# Patient Record
Sex: Female | Born: 1970 | Race: Black or African American | Hispanic: No | Smoking: Never smoker
Health system: Southern US, Community
[De-identification: ages and names within clinical notes are randomized; demographics above are authoritative.]

## PROBLEM LIST (undated history)

## (undated) DIAGNOSIS — J45909 Unspecified asthma, uncomplicated: Secondary | ICD-10-CM

## (undated) DIAGNOSIS — E559 Vitamin D deficiency, unspecified: Secondary | ICD-10-CM

## (undated) DIAGNOSIS — G43409 Hemiplegic migraine, not intractable, without status migrainosus: Secondary | ICD-10-CM

## (undated) DIAGNOSIS — R55 Syncope and collapse: Secondary | ICD-10-CM

## (undated) DIAGNOSIS — D649 Anemia, unspecified: Secondary | ICD-10-CM

## (undated) DIAGNOSIS — T7840XA Allergy, unspecified, initial encounter: Secondary | ICD-10-CM

## (undated) DIAGNOSIS — F909 Attention-deficit hyperactivity disorder, unspecified type: Secondary | ICD-10-CM

## (undated) DIAGNOSIS — K429 Umbilical hernia without obstruction or gangrene: Secondary | ICD-10-CM

## (undated) DIAGNOSIS — Z8709 Personal history of other diseases of the respiratory system: Secondary | ICD-10-CM

## (undated) DIAGNOSIS — Z8489 Family history of other specified conditions: Secondary | ICD-10-CM

## (undated) DIAGNOSIS — K219 Gastro-esophageal reflux disease without esophagitis: Secondary | ICD-10-CM

## (undated) DIAGNOSIS — F419 Anxiety disorder, unspecified: Secondary | ICD-10-CM

## (undated) DIAGNOSIS — E049 Nontoxic goiter, unspecified: Secondary | ICD-10-CM

## (undated) DIAGNOSIS — Z8659 Personal history of other mental and behavioral disorders: Secondary | ICD-10-CM

## (undated) DIAGNOSIS — I10 Essential (primary) hypertension: Secondary | ICD-10-CM

## (undated) DIAGNOSIS — R569 Unspecified convulsions: Secondary | ICD-10-CM

## (undated) DIAGNOSIS — F329 Major depressive disorder, single episode, unspecified: Secondary | ICD-10-CM

## (undated) HISTORY — DX: Allergy, unspecified, initial encounter: T78.40XA

## (undated) HISTORY — DX: Unspecified convulsions: R56.9

## (undated) HISTORY — DX: Vitamin D deficiency, unspecified: E55.9

## (undated) HISTORY — DX: Hemiplegic migraine, not intractable, without status migrainosus: G43.409

## (undated) HISTORY — DX: Anxiety disorder, unspecified: F41.9

## (undated) HISTORY — DX: Unspecified asthma, uncomplicated: J45.909

## (undated) HISTORY — DX: Umbilical hernia without obstruction or gangrene: K42.9

## (undated) HISTORY — DX: Major depressive disorder, single episode, unspecified: F32.9

## (undated) HISTORY — PX: WISDOM TOOTH EXTRACTION: SHX21

## (undated) HISTORY — DX: Syncope and collapse: R55

## (undated) HISTORY — DX: Anemia, unspecified: D64.9

## (undated) HISTORY — DX: Gastro-esophageal reflux disease without esophagitis: K21.9

## (undated) SURGERY — Surgical Case
Anesthesia: *Unknown

---
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
## 1998-09-14 ENCOUNTER — Other Ambulatory Visit: Admission: RE | Admit: 1998-09-14 | Discharge: 1998-09-14 | Payer: Self-pay | Admitting: Family Medicine

## 1998-12-20 DIAGNOSIS — E049 Nontoxic goiter, unspecified: Secondary | ICD-10-CM | POA: Insufficient documentation

## 2000-01-18 ENCOUNTER — Emergency Department (HOSPITAL_COMMUNITY): Admission: EM | Admit: 2000-01-18 | Discharge: 2000-01-18 | Payer: Self-pay | Admitting: *Deleted

## 2000-06-06 ENCOUNTER — Encounter: Admission: RE | Admit: 2000-06-06 | Discharge: 2000-06-06 | Payer: Self-pay | Admitting: Family Medicine

## 2000-06-06 ENCOUNTER — Encounter: Payer: Self-pay | Admitting: Family Medicine

## 2000-12-29 ENCOUNTER — Encounter: Admission: RE | Admit: 2000-12-29 | Discharge: 2000-12-29 | Payer: Self-pay | Admitting: Family Medicine

## 2000-12-29 ENCOUNTER — Encounter: Payer: Self-pay | Admitting: Family Medicine

## 2001-01-11 ENCOUNTER — Encounter: Admission: RE | Admit: 2001-01-11 | Discharge: 2001-04-11 | Payer: Self-pay | Admitting: Family Medicine

## 2001-12-23 ENCOUNTER — Emergency Department (HOSPITAL_COMMUNITY): Admission: EM | Admit: 2001-12-23 | Discharge: 2001-12-23 | Payer: Self-pay | Admitting: Emergency Medicine

## 2003-11-21 ENCOUNTER — Emergency Department (HOSPITAL_COMMUNITY): Admission: EM | Admit: 2003-11-21 | Discharge: 2003-11-21 | Payer: Self-pay | Admitting: Emergency Medicine

## 2005-01-11 ENCOUNTER — Other Ambulatory Visit: Admission: RE | Admit: 2005-01-11 | Discharge: 2005-01-11 | Payer: Self-pay | Admitting: Family Medicine

## 2005-01-20 ENCOUNTER — Encounter: Admission: RE | Admit: 2005-01-20 | Discharge: 2005-01-20 | Payer: Self-pay | Admitting: Family Medicine

## 2006-07-28 ENCOUNTER — Encounter: Admission: RE | Admit: 2006-07-28 | Discharge: 2006-07-28 | Payer: Self-pay | Admitting: Family Medicine

## 2008-05-06 ENCOUNTER — Encounter: Admission: RE | Admit: 2008-05-06 | Discharge: 2008-05-06 | Payer: Self-pay | Admitting: Family Medicine

## 2008-06-04 ENCOUNTER — Emergency Department (HOSPITAL_COMMUNITY): Admission: EM | Admit: 2008-06-04 | Discharge: 2008-06-04 | Payer: Self-pay | Admitting: Emergency Medicine

## 2008-12-05 ENCOUNTER — Emergency Department (HOSPITAL_COMMUNITY): Admission: EM | Admit: 2008-12-05 | Discharge: 2008-12-05 | Payer: Self-pay | Admitting: Emergency Medicine

## 2011-01-09 ENCOUNTER — Encounter: Payer: Self-pay | Admitting: Family Medicine

## 2013-10-17 LAB — LAB REPORT - SCANNED
EGFR (Non-African Amer.): 75.88
Free T4: 0.93 ng/dL
TSH: 0.47 (ref 0.41–5.90)

## 2014-05-14 ENCOUNTER — Emergency Department (HOSPITAL_COMMUNITY)
Admission: EM | Admit: 2014-05-14 | Discharge: 2014-05-14 | Discharge status: Against Medical Advice | Payer: Self-pay | Attending: Emergency Medicine | Admitting: Emergency Medicine

## 2014-05-14 DIAGNOSIS — R0602 Shortness of breath: Secondary | ICD-10-CM | POA: Insufficient documentation

## 2014-05-14 NOTE — ED Notes (Signed)
Pt states that she feels better and would like to go home. Explained to pt the risk and her leaving. Pt remains wait to leave. Encouraged pt to follow up with PCP and that if symptoms returned or got worst to return to ED for evaluation. Pt verbalized understanding of directions. Pt VS stable. Pt alert and ambulatory.

## 2016-11-27 ENCOUNTER — Ambulatory Visit (HOSPITAL_COMMUNITY)
Admission: EM | Admit: 2016-11-27 | Discharge: 2016-11-27 | Disposition: A | Discharge status: Home / Self Care | Payer: Self-pay | Attending: Emergency Medicine | Admitting: Emergency Medicine

## 2016-11-27 ENCOUNTER — Encounter (HOSPITAL_COMMUNITY): Payer: Self-pay | Admitting: Emergency Medicine

## 2016-11-27 DIAGNOSIS — B349 Viral infection, unspecified: Secondary | ICD-10-CM

## 2016-11-27 HISTORY — DX: Nontoxic goiter, unspecified: E04.9

## 2016-11-27 NOTE — ED Provider Notes (Signed)
[facility]    CSN: [id number] Arrival date & time: 11/27/16  1248     History   Chief Complaint Chief Complaint  Patient presents with  . Headache  . Torticollis    HPI Whitney Kent is a 45 y.o. female.   HPI She is a 45 year old woman here for evaluation of headache and neck pain. Her symptoms started Friday night with just not feeling very well. She describes a generalized headache, feeling like her neck is stiff and sore, and redness of the eyes. She also reports some drainage from the eyes, worse on the right. She's had a little bit of nasal congestion. No sore throat. No cough or shortness of breath. She reports a possible fever Friday night, but none recently. She'll have diarrhea, but no nausea or vomiting. Denies body aches. Does report some fatigue.  Past Medical History:  Diagnosis Date  . Goiter     There are no active problems to display for this patient.   History reviewed. No pertinent surgical history.  OB History    No data available       Home Medications    Prior to Admission medications   Not on File    Family History No family history on file.  Social History Social History  Substance Use Topics  . Smoking status: Not on file  . Smokeless tobacco: Not on file  . Alcohol use Not on file     Allergies   Patient has no known allergies.   Review of Systems Review of Systems As in history of present illness  Physical Exam Triage Vital Signs ED Triage Vitals [11/27/16 1302]  Enc Vitals Group     BP 113/73     Pulse Rate 72     Resp 18     Temp 98.7 F (37.1 C)     Temp Source Oral     SpO2 100 %     Weight      Height      Head Circumference      Peak Flow      Pain Score      Pain Loc      Pain Edu?      Excl. in [place]?    No data found.   Updated Vital Signs BP 113/73 (BP Location: Left Arm)   Pulse 72   Temp 98.7 F (37.1 C) (Oral)   Resp 18   Ht 5\' 2"  (1.575 m)   Wt 122 lb (55.3 kg)   SpO2  100%   BMI 22.31 kg/m   Visual Acuity Right Eye Distance:   Left Eye Distance:   Bilateral Distance:    Right Eye Near:   Left Eye Near:    Bilateral Near:     Physical Exam  Constitutional: She is oriented to person, place, and time. She appears well-developed and well-nourished. No distress.  Appears fatigued  HENT:  Nose: Nose normal.  Mouth/Throat: Oropharynx is clear and moist. No oropharyngeal exudate.  TMs normal bilaterally  Neck: Normal range of motion. Neck supple.  Cardiovascular: Normal rate, regular rhythm and normal heart sounds.   No murmur heard. Pulmonary/Chest: Effort normal and breath sounds normal. No respiratory distress. She has no wheezes. She has no rales.  Lymphadenopathy:    She has no cervical adenopathy.  Neurological: She is alert and oriented to person, place, and time.     UC Treatments / Results  Labs (all labs ordered are listed, but only  abnormal results are displayed) Labs Reviewed - No data to display  EKG  EKG Interpretation None       Radiology No results found.  Procedures Procedures (including critical care time)  Medications Ordered in UC Medications - No data to display   Initial Impression / Assessment and Plan / UC Course  I have reviewed the triage vital signs and the nursing notes.  Pertinent labs & imaging results that were available during my care of the patient were reviewed by me and considered in my medical decision making (see chart for details).  Clinical Course     Exam unremarkable. No meningeal signs. Symptomatic treatment with Tylenol and ibuprofen. Return precautions reviewed.  Final Clinical Impressions(s) / UC Diagnoses   Final diagnoses:  Viral syndrome    New Prescriptions There are no discharge medications for this patient.    Melony Overly, MD 11/27/16 1350

## 2016-11-27 NOTE — Discharge Instructions (Signed)
Your symptoms are coming from a virus. This is not the flu. Get plenty of rest and drink plenty of fluids. Use ibuprofen or Tylenol as needed for headache and neck pain. Get some saline drops to help with your eyes. Things should start improving in the next 2-3 days. If you develop fevers, trouble moving your neck, or are getting worse, please go to the emergency room.

## 2016-11-27 NOTE — ED Triage Notes (Signed)
PT reports stiff neck, headache, and chills. PT reports eye drainage as well.

## 2016-12-19 HISTORY — PX: COLONOSCOPY: SHX174

## 2017-03-08 ENCOUNTER — Ambulatory Visit (HOSPITAL_COMMUNITY)
Admission: EM | Admit: 2017-03-08 | Discharge: 2017-03-08 | Disposition: A | Discharge status: Home / Self Care | Payer: Self-pay | Attending: Emergency Medicine | Admitting: Emergency Medicine

## 2017-03-08 ENCOUNTER — Encounter (HOSPITAL_COMMUNITY): Payer: Self-pay | Admitting: Emergency Medicine

## 2017-03-08 DIAGNOSIS — R197 Diarrhea, unspecified: Secondary | ICD-10-CM

## 2017-03-08 DIAGNOSIS — M5442 Lumbago with sciatica, left side: Secondary | ICD-10-CM

## 2017-03-08 DIAGNOSIS — M791 Myalgia: Secondary | ICD-10-CM

## 2017-03-08 MED ORDER — KETOROLAC TROMETHAMINE 30 MG/ML IJ SOLN
INTRAMUSCULAR | Status: AC
  Filled 2017-03-08: qty 1

## 2017-03-08 MED ORDER — KETOROLAC TROMETHAMINE 60 MG/2ML IM SOLN
30.0000 mg | Freq: Once | INTRAMUSCULAR | Status: AC
  Administered 2017-03-08: 30 mg via INTRAMUSCULAR

## 2017-03-08 MED ORDER — TRAMADOL HCL 50 MG PO TABS: 50.0000 mg | ORAL_TABLET | Freq: Four times a day (QID) | ORAL | 0 refills | Status: DC | PRN

## 2017-03-08 MED ORDER — METHYLPREDNISOLONE 4 MG PO TBPK: ORAL_TABLET | ORAL | 0 refills | Status: DC

## 2017-03-08 MED ORDER — METAXALONE 800 MG PO TABS: 800.0000 mg | ORAL_TABLET | Freq: Three times a day (TID) | ORAL | 0 refills | Status: DC

## 2017-03-08 MED ORDER — IBUPROFEN 600 MG PO TABS: 600.0000 mg | ORAL_TABLET | Freq: Four times a day (QID) | ORAL | 0 refills | Status: DC | PRN

## 2017-03-08 NOTE — ED Triage Notes (Signed)
Onset 3/14.  The morning of 3/14 and diarrhea, pain in back started at the same time.  Pain in bilateral buttocks.

## 2017-03-08 NOTE — Discharge Instructions (Signed)
Take the NSAID on a regular basis for the next 10 days. tramadol for severe pain only. many people find gentle stretching and deep tissue massage helpful. Try [facility] on [address]. They have very reasonable prices and take walk ins. Or you can go to  [facility]. Follow-up with your primary care physician in several days, go to the ER for the signs and symptoms we discussed.

## 2017-03-08 NOTE — ED Provider Notes (Signed)
HPI  SUBJECTIVE:  Whitney Kent is a 46 y.o. female who presents with 2 weeks of bilateral low back pain that she describes as daily, stabbing, tight muscles. States it is waxing and waning. She states that it radiates down both buttocks, but is worse on the left side. The symptoms are worse with sitting for prolonged periods of time, torso rotation to the left, and when she is "feeling gassy. Symptoms are better with standing up, having bowel movements. She has tried heat, ice, sitting in a whirlpool, topical lidocaine, ibuprofen 400 mg every 6 8 hours and stretching. Last dose of ibuprofen was within 6-8 hours of evaluation. She denies recent heavy lifting, trauma to her back, N/V, fevers, flank pain, abdominal pain, urinary urgency, frequency, dysuria, cloudy or odorous urine, hematuria.  No syncope. No saddle anesthesia, distal weakness/numbness, bilateral radicular leg pain/weakness, fevers/night sweats, neurological deficits,  bladder/ bowel incontinence, urinary retention, h/o CA / multiple myleoma, unexplained weight loss, pain worse at night,  h/o prolonged steroid use, h/o osteopenia, h/o IVDU, h/o HIV, recent bacteremia, known AAA.  States feels similar to previous episodes of sciatica. She has a past medical history of pyelonephritis, sciatica, IBS, goiter. States does not feel similar to pyelonephritis. LMP: Today. She denies possibility of being pregnant. PMD: Dr. Julian Hy.    Past Medical History:  Diagnosis Date  . Goiter     No past surgical history on file.  No family history on file.  Social History  Substance Use Topics  . Smoking status: Never Smoker  . Smokeless tobacco: Not on file  . Alcohol use Yes     Current Facility-Administered Medications:  .  ketorolac (TORADOL) injection 30 mg, 30 mg, Intramuscular, Once, Melynda Ripple, MD  Current Outpatient Prescriptions:  .  ASPERCREME W/LIDOCAINE EX, Apply topically., Disp: , Rfl:  .  ibuprofen  (ADVIL,MOTRIN) 600 MG tablet, Take 1 tablet (600 mg total) by mouth every 6 (six) hours as needed., Disp: 30 tablet, Rfl: 0 .  metaxalone (SKELAXIN) 800 MG tablet, Take 1 tablet (800 mg total) by mouth 3 (three) times daily., Disp: 21 tablet, Rfl: 0 .  methylPREDNISolone (MEDROL DOSEPAK) 4 MG TBPK tablet, follow package directions, Disp: 21 tablet, Rfl: 0 .  traMADol (ULTRAM) 50 MG tablet, Take 1 tablet (50 mg total) by mouth every 6 (six) hours as needed., Disp: 20 tablet, Rfl: 0  No Known Allergies   ROS  As noted in HPI.   Physical Exam  BP 98/63 (BP Location: Right Arm)   Pulse 83   Temp 98.3 F (36.8 C) (Oral)   LMP 03/08/2017   SpO2 97%   Constitutional: Well developed, well nourished, no acute distress Eyes:  EOMI, conjunctiva normal bilaterally HENT: Normocephalic, atraumatic,mucus membranes moist Respiratory: Normal inspiratory effort Cardiovascular: Normal rate GI: nondistended. No suprapubic, flank tenderness. Normal bowel sounds skin: No rash, skin intact Musculoskeletal: no CVAT. + L paralumbar tenderness, + mild left-sided muscle spasm. Mild tenderness along the L SI joint. No other bony tenderness. Bilateral lower extremities nontender, baseline ROM with intact  PT pulses. No pain with int/ext rotation flex/extension hips bilaterally. SLR positive left side. Sensation baseline light touch bilaterally for Pt, DTR's symmetric and intact bilaterally KJ , Motor symmetric bilateral 5/5 hip flexion, quadriceps, hamstrings, EHL, foot dorsiflexion, foot plantarflexion, gait normal  Neurologic: Alert & oriented x 3, no focal neuro deficits Psychiatric: Speech and behavior appropriate   ED Course   Medications  ketorolac (TORADOL) injection 30 mg (not administered)  No orders of the defined types were placed in this encounter.   No results found for this or any previous visit (from the past 24 hour(s)). No results found.  ED Clinical Impression  Acute bilateral  low back pain with left-sided sciatica   ED Assessment/Plan  [place] narcotic database reviewed. Pt with no narcotic rx in the past 6 months.   No evidence of uti, nephrolithiasis.  No evidence of spinal cord involvement based on H&P. Pt describing typical back pain, has been < 6 week duration. No historical red flags as noted in HPI. No physical red flags such as fever, midline bony tenderness, lower extremity weakness, saddle anesthesia. Imaging not indicated at this time.   Giving Toradol 30 mg IM 1. Pt ambulatory in the UC. Home with NSAID, tramadol,  muscle relaxants and Medrol Dosepak. Pt to f/u with  PMD.  Discussed medical decision-making, and plan for follow-up with the patient.  Discussed signs and symptoms that should prompt return to the emergency department.  Patient agrees with plan.  Meds ordered this encounter  Medications  . DISCONTD: ibuprofen (ADVIL,MOTRIN) 200 MG tablet    Sig: Take 200 mg by mouth every 6 (six) hours as needed.  . ASPERCREME W/LIDOCAINE EX    Sig: Apply topically.  Marland Kitchen ketorolac (TORADOL) injection 30 mg  . traMADol (ULTRAM) 50 MG tablet    Sig: Take 1 tablet (50 mg total) by mouth every 6 (six) hours as needed.    Dispense:  20 tablet    Refill:  0  . ibuprofen (ADVIL,MOTRIN) 600 MG tablet    Sig: Take 1 tablet (600 mg total) by mouth every 6 (six) hours as needed.    Dispense:  30 tablet    Refill:  0  . metaxalone (SKELAXIN) 800 MG tablet    Sig: Take 1 tablet (800 mg total) by mouth 3 (three) times daily.    Dispense:  21 tablet    Refill:  0  . methylPREDNISolone (MEDROL DOSEPAK) 4 MG TBPK tablet    Sig: follow package directions    Dispense:  21 tablet    Refill:  0   *This clinic note was created using [employer]. Therefore, there may be occasional mistakes despite careful proofreading.  ?    Melynda Ripple, MD 03/08/17 [phone]

## 2017-04-06 ENCOUNTER — Encounter (HOSPITAL_COMMUNITY): Payer: Self-pay

## 2017-04-06 ENCOUNTER — Emergency Department (HOSPITAL_COMMUNITY)
Admission: EM | Admit: 2017-04-06 | Discharge: 2017-04-06 | Disposition: A | Discharge status: Home / Self Care | Payer: Self-pay | Attending: Emergency Medicine | Admitting: Emergency Medicine

## 2017-04-06 DIAGNOSIS — M62830 Muscle spasm of back: Secondary | ICD-10-CM | POA: Insufficient documentation

## 2017-04-06 DIAGNOSIS — Z79899 Other long term (current) drug therapy: Secondary | ICD-10-CM | POA: Insufficient documentation

## 2017-04-06 DIAGNOSIS — M5441 Lumbago with sciatica, right side: Secondary | ICD-10-CM | POA: Insufficient documentation

## 2017-04-06 MED ORDER — LIDOCAINE 5 % EX PTCH: 1.0000 | MEDICATED_PATCH | CUTANEOUS | 0 refills | Status: DC

## 2017-04-06 MED ORDER — METHOCARBAMOL 500 MG PO TABS: 500.0000 mg | ORAL_TABLET | Freq: Three times a day (TID) | ORAL | 0 refills | Status: DC | PRN

## 2017-04-06 MED ORDER — DOCUSATE SODIUM 100 MG PO CAPS: 100.0000 mg | ORAL_CAPSULE | Freq: Two times a day (BID) | ORAL | 0 refills | Status: DC

## 2017-04-06 MED ORDER — HYDROCODONE-ACETAMINOPHEN 5-325 MG PO TABS: 2.0000 | ORAL_TABLET | ORAL | 0 refills | Status: DC | PRN

## 2017-04-06 MED ORDER — METHOCARBAMOL 500 MG PO TABS
1000.0000 mg | ORAL_TABLET | Freq: Once | ORAL | Status: AC
  Administered 2017-04-06: 1000 mg via ORAL
  Filled 2017-04-06: qty 2

## 2017-04-06 NOTE — ED Provider Notes (Signed)
[facility] DEPT Provider Note   CSN: [id number] Arrival date & time: 04/06/17  4098     History   Chief Complaint Chief Complaint  Patient presents with  . Back Pain    HPI Whitney Kent is a 46 y.o. female. Chief complaint is right low back pain  HPI: 46 year old female with no prior history of back issues, acute or chronic. On March 7 she states that she had diarrhea. She states by the next day she was having back pain. Her back pain has waxed and waned since then. It has now been present for 5 weeks. She is taking a muscle relaxant, ibuprofen, tramadol and she seemed to get constipated on tramadol. She felt like when she had to bear down her pain became worse. It is right-sided. Gluteal. Sometimes radiating to the leg. She was treated with prednisone for possible sciatica  She's not had bowel or bladder incontinence. She has not had frank retention. After she stop the tramadol., Laxative and has had normal bowel movements. She has normal perineal sensation. She does not have limitation of strength in her legs  She lives in [place]. She works in [place] and to the [place] as well as driving a lot with her job. Getting in and out of her car causing her discomfort. Described as spasmodic, right low back.  She describes urinating less over the last few days. Should she reduce this to drinking this. She denies any difficulty emptying her bladder. She has had no incontinence. No dysuria  Should cancer. No fevers. Past Medical History:  Diagnosis Date  . Goiter     There are no active problems to display for this patient.   History reviewed. No pertinent surgical history.  OB History    No data available       Home Medications    Prior to Admission medications   Medication Sig Start Date End Date Taking? Authorizing Provider  docusate sodium (COLACE) 100 MG capsule Take 1 capsule (100 mg total) by mouth every 12 (twelve) hours. 04/06/17   Tanna Furry, MD    HYDROcodone-acetaminophen (NORCO/VICODIN) 5-325 MG tablet Take 2 tablets by mouth every 4 (four) hours as needed. 04/06/17   Tanna Furry, MD  ibuprofen (ADVIL,MOTRIN) 600 MG tablet Take 1 tablet (600 mg total) by mouth every 6 (six) hours as needed. 03/08/17   Melynda Ripple, MD  lidocaine (LIDODERM) 5 % Place 1 patch onto the skin daily. Remove & Discard patch within 12 hours or as directed by MD 04/06/17   Tanna Furry, MD  metaxalone (SKELAXIN) 800 MG tablet Take 1 tablet (800 mg total) by mouth 3 (three) times daily. 03/08/17   Melynda Ripple, MD  methocarbamol (ROBAXIN) 500 MG tablet Take 1 tablet (500 mg total) by mouth 3 (three) times daily between meals as needed. 04/06/17   Tanna Furry, MD  methylPREDNISolone (MEDROL DOSEPAK) 4 MG TBPK tablet follow package directions 03/08/17   Melynda Ripple, MD  traMADol (ULTRAM) 50 MG tablet Take 1 tablet (50 mg total) by mouth every 6 (six) hours as needed. 03/08/17   Melynda Ripple, MD    Family History History reviewed. No pertinent family history.  Social History Social History  Substance Use Topics  . Smoking status: Never Smoker  . Smokeless tobacco: Never Used  . Alcohol use Yes     Allergies   Patient has no known allergies.   Review of Systems Review of Systems  Constitutional: Negative for appetite change, chills, diaphoresis, fatigue and  fever.  HENT: Negative for mouth sores, sore throat and trouble swallowing.   Eyes: Negative for visual disturbance.  Respiratory: Negative for cough, chest tightness, shortness of breath and wheezing.   Cardiovascular: Negative for chest pain.  Gastrointestinal: Negative for abdominal distention, abdominal pain, diarrhea, nausea and vomiting.  Endocrine: Negative for polydipsia, polyphagia and polyuria.  Genitourinary: Positive for decreased urine volume. Negative for dysuria, frequency and hematuria.  Musculoskeletal: Positive for back pain. Negative for gait problem.  Skin: Negative for  color change, pallor and rash.  Neurological: Negative for dizziness, syncope, light-headedness and headaches.  Hematological: Does not bruise/bleed easily.  Psychiatric/Behavioral: Negative for behavioral problems and confusion.     Physical Exam Updated Vital Signs BP 119/90 (BP Location: Left Arm)   Pulse (!) 102   Temp 97.7 F (36.5 C) (Oral)   Resp 18   Ht 5\' 2"  (1.575 m)   Wt 117 lb (53.1 kg)   LMP 03/08/2017   SpO2 100%   BMI 21.40 kg/m   Physical Exam  Constitutional: She is oriented to person, place, and time. She appears well-developed and well-nourished. No distress.  HENT:  Head: Normocephalic.  Eyes: Conjunctivae are normal. Pupils are equal, round, and reactive to light. No scleral icterus.  Neck: Normal range of motion. Neck supple. No thyromegaly present.  Cardiovascular: Normal rate and regular rhythm.  Exam reveals no gallop and no friction rub.   No murmur heard. Pulmonary/Chest: Effort normal and breath sounds normal. No respiratory distress. She has no wheezes. She has no rales.  Abdominal: Soft. Bowel sounds are normal. She exhibits no distension. There is no tenderness. There is no rebound.  Musculoskeletal: Normal range of motion.       Back:  Neurological: She is alert and oriented to person, place, and time.  Norma symmetric strength to flex/.extend hip and knees, dorsi/plantar flex ankles. Normal symmetric sensation to all distributions to LEs Patellar and achilles reflexes 1-2+. Downgoing Babinski   Skin: Skin is warm and dry. No rash noted.  Psychiatric: She has a normal mood and affect. Her behavior is normal.     ED Treatments / Results  Labs (all labs ordered are listed, but only abnormal results are displayed) Labs Reviewed - No data to display  EKG  EKG Interpretation None       Radiology No results found.  Procedures Procedures (including critical care time)  Medications Ordered in ED Medications  methocarbamol  (ROBAXIN) tablet 1,000 mg (not administered)     Initial Impression / Assessment and Plan / ED Course  I have reviewed the triage vital signs and the nursing notes.  Pertinent labs & imaging results that were available during my care of the patient were reviewed by me and considered in my medical decision making (see chart for details).   no indicators for imaging. No indicators for acute neurological compromise. Pain is reproducible to palpation in the gluteal musculature. Does not currently have sciatic radiation. Does not have radicular symptoms or findings. Neurologically intact with normal strength, range, and reflexes. Limited bedside ultrasound shows an empty bladder. She has normal perineal sensation. Rectal exam not performed.  I discussed using pain medicine on a limited basis for today and tomorrow with Colace to avoid constipation. Robaxin. I recommended physical therapy. However patient isn't insured patient she will discuss this with her primary care. I have prescribed lidocaine patches to use locally and topically. I discussed symptoms that would indicate return for evaluation possible imaging including radicular pattern,  incontinence, retention, weakness. Patient question understanding of the above instructions.    Final Clinical Impressions(s) / ED Diagnoses   Final diagnoses:  Right-sided low back pain with right-sided sciatica, unspecified chronicity  Muscle spasm of back    New Prescriptions New Prescriptions   DOCUSATE SODIUM (COLACE) 100 MG CAPSULE    Take 1 capsule (100 mg total) by mouth every 12 (twelve) hours.   HYDROCODONE-ACETAMINOPHEN (NORCO/VICODIN) 5-325 MG TABLET    Take 2 tablets by mouth every 4 (four) hours as needed.   LIDOCAINE (LIDODERM) 5 %    Place 1 patch onto the skin daily. Remove & Discard patch within 12 hours or as directed by MD   METHOCARBAMOL (ROBAXIN) 500 MG TABLET    Take 1 tablet (500 mg total) by mouth 3 (three) times daily between meals  as needed.     Tanna Furry, MD 04/06/17 [phone]

## 2017-04-06 NOTE — ED Notes (Signed)
ED Provider at bedside. 

## 2017-04-06 NOTE — ED Triage Notes (Signed)
States back pain since March of this year has seen multiple doctors for this pain and given medication and since last pm unable to get pain to go away and states states now she has urinary difficulty unable to urinate. States most of pain on right side.

## 2017-04-06 NOTE — Discharge Instructions (Signed)
Robaxin Rx (Muscle relaxant). Vicoden Rx (pain medication) . Can cause constipation. Take colace am and pm while taking vicoden. Check with your primary care physician Monday if not improving.

## 2017-04-20 ENCOUNTER — Other Ambulatory Visit: Payer: Self-pay | Admitting: Nurse Practitioner

## 2017-04-20 DIAGNOSIS — M533 Sacrococcygeal disorders, not elsewhere classified: Secondary | ICD-10-CM

## 2017-04-20 DIAGNOSIS — R1030 Lower abdominal pain, unspecified: Secondary | ICD-10-CM

## 2017-04-20 DIAGNOSIS — R198 Other specified symptoms and signs involving the digestive system and abdomen: Secondary | ICD-10-CM

## 2017-04-25 ENCOUNTER — Ambulatory Visit
Admission: RE | Admit: 2017-04-25 | Discharge: 2017-04-25 | Disposition: A | Discharge status: Home / Self Care | Payer: Self-pay | Source: Ambulatory Visit | Attending: Nurse Practitioner | Admitting: Nurse Practitioner

## 2017-04-25 DIAGNOSIS — M533 Sacrococcygeal disorders, not elsewhere classified: Secondary | ICD-10-CM

## 2017-04-25 DIAGNOSIS — R1030 Lower abdominal pain, unspecified: Secondary | ICD-10-CM

## 2017-04-25 DIAGNOSIS — R198 Other specified symptoms and signs involving the digestive system and abdomen: Secondary | ICD-10-CM

## 2017-04-25 MED ORDER — IOPAMIDOL (ISOVUE-300) INJECTION 61%
100.0000 mL | Freq: Once | INTRAVENOUS | Status: AC | PRN
  Administered 2017-04-25: 100 mL via INTRAVENOUS

## 2017-09-18 ENCOUNTER — Telehealth: Payer: Self-pay | Admitting: Endocrinology

## 2017-09-18 ENCOUNTER — Encounter: Payer: Self-pay | Admitting: Gastroenterology

## 2017-09-18 NOTE — Telephone Encounter (Signed)
Patient calling to set up a new patient appointment

## 2017-09-25 ENCOUNTER — Encounter: Payer: Self-pay | Admitting: Family Medicine

## 2017-09-25 ENCOUNTER — Ambulatory Visit (INDEPENDENT_AMBULATORY_CARE_PROVIDER_SITE_OTHER): Payer: PRIVATE HEALTH INSURANCE | Admitting: Family Medicine

## 2017-09-25 VITALS — BP 100/76 | HR 71 | Temp 98.0°F | Ht 62.5 in | Wt 128.1 lb

## 2017-09-25 DIAGNOSIS — Z23 Encounter for immunization: Secondary | ICD-10-CM

## 2017-09-25 DIAGNOSIS — Z7689 Persons encountering health services in other specified circumstances: Secondary | ICD-10-CM

## 2017-09-25 DIAGNOSIS — E049 Nontoxic goiter, unspecified: Secondary | ICD-10-CM | POA: Diagnosis not present

## 2017-09-25 DIAGNOSIS — R5383 Other fatigue: Secondary | ICD-10-CM

## 2017-09-25 DIAGNOSIS — Z13220 Encounter for screening for lipoid disorders: Secondary | ICD-10-CM | POA: Diagnosis not present

## 2017-09-25 DIAGNOSIS — R41840 Attention and concentration deficit: Secondary | ICD-10-CM | POA: Diagnosis not present

## 2017-09-25 LAB — CBC WITH DIFFERENTIAL/PLATELET
BASOS ABS: 0 10*3/uL (ref 0.0–0.1)
Basophils Relative: 0.1 % (ref 0.0–3.0)
EOS ABS: 0.3 10*3/uL (ref 0.0–0.7)
Eosinophils Relative: 5 % (ref 0.0–5.0)
HEMATOCRIT: 40.6 % (ref 36.0–46.0)
Hemoglobin: 13.4 g/dL (ref 12.0–15.0)
LYMPHS PCT: 28.3 % (ref 12.0–46.0)
Lymphs Abs: 2 10*3/uL (ref 0.7–4.0)
MCHC: 33 g/dL (ref 30.0–36.0)
MCV: 82.8 fl (ref 78.0–100.0)
MONOS PCT: 6.9 % (ref 3.0–12.0)
Monocytes Absolute: 0.5 10*3/uL (ref 0.1–1.0)
Neutro Abs: 4.2 10*3/uL (ref 1.4–7.7)
Neutrophils Relative %: 59.7 % (ref 43.0–77.0)
Platelets: 236 10*3/uL (ref 150.0–400.0)
RBC: 4.91 Mil/uL (ref 3.87–5.11)
RDW: 13.1 % (ref 11.5–15.5)
WBC: 7 10*3/uL (ref 4.0–10.5)

## 2017-09-25 LAB — LIPID PANEL
CHOL/HDL RATIO: 2
Cholesterol: 160 mg/dL (ref 0–200)
HDL: 86.6 mg/dL (ref >39.00)
LDL Cholesterol: 55 mg/dL (ref 0–99)
NONHDL: 73.2
Triglycerides: 89 mg/dL (ref 0.0–149.0)
VLDL: 17.8 mg/dL (ref 0.0–40.0)

## 2017-09-25 LAB — COMPREHENSIVE METABOLIC PANEL
ALBUMIN: 4.2 g/dL (ref 3.5–5.2)
ALK PHOS: 43 U/L (ref 39–117)
ALT: 6 U/L (ref 0–35)
AST: 12 U/L (ref 0–37)
BILIRUBIN TOTAL: 0.3 mg/dL (ref 0.2–1.2)
BUN: 9 mg/dL (ref 6–23)
CO2: 28 mEq/L (ref 19–32)
CREATININE: 0.67 mg/dL (ref 0.40–1.20)
Calcium: 9.2 mg/dL (ref 8.4–10.5)
Chloride: 103 mEq/L (ref 96–112)
GFR: 122.65 mL/min (ref >60.00)
Glucose, Bld: 97 mg/dL (ref 70–99)
POTASSIUM: 4.2 meq/L (ref 3.5–5.1)
SODIUM: 137 meq/L (ref 135–145)
TOTAL PROTEIN: 6.8 g/dL (ref 6.0–8.3)

## 2017-09-25 LAB — T4, FREE: Free T4: 0.89 ng/dL (ref 0.60–1.60)

## 2017-09-25 LAB — VITAMIN D 25 HYDROXY (VIT D DEFICIENCY, FRACTURES): VITD: 29.16 ng/mL — ABNORMAL LOW (ref 30.00–100.00)

## 2017-09-25 LAB — TSH: TSH: 0.74 u[IU]/mL (ref 0.35–4.50)

## 2017-09-25 NOTE — Patient Instructions (Addendum)
We have ordered labs at this visit. It can take up to 1-2 weeks for results and processing. If results require follow up or explanation, we will call you with instructions. Clinically stable results will be released to your [facility] or sent to you via mail. If you have not heard from [place] or cannot find your results in [facility] in 2 weeks please contact our office at [phone].    Fatigue Fatigue is feeling tired all of the time, a lack of energy, or a lack of motivation. Occasional or mild fatigue is often a normal response to activity or life in general. However, long-lasting (chronic) or extreme fatigue may indicate an underlying medical condition. Follow these instructions at home: Watch your fatigue for any changes. The following actions may help to lessen any discomfort you are feeling:  Talk to your health care provider about how much sleep you need each night. Try to get the required amount every night.  Take medicines only as directed by your health care provider.  Eat a healthy and nutritious diet. Ask your health care provider if you need help changing your diet.  Drink enough fluid to keep your urine clear or pale yellow.  Practice ways of relaxing, such as yoga, meditation, massage therapy, or acupuncture.  Exercise regularly.  Change situations that cause you stress. Try to keep your work and personal routine reasonable.  Do not abuse illegal drugs.  Limit alcohol intake to no more than 1 drink per day for nonpregnant women and 2 drinks per day for men. One drink equals 12 ounces of beer, 5 ounces of wine, or 1 ounces of hard liquor.  Take a multivitamin, if directed by your health care provider.  Contact a health care provider if:  Your fatigue does not get better.  You have a fever.  You have unintentional weight loss or gain.  You have headaches.  You have difficulty: ? Falling asleep. ? Sleeping throughout the night.  You feel angry, guilty, anxious, or  sad.  You are unable to have a bowel movement (constipation).  You skin is dry.  Your legs or another part of your body is swollen. Get help right away if:  You feel confused.  Your vision is blurry.  You feel faint or pass out.  You have a severe headache.  You have severe abdominal, pelvic, or back pain.  You have chest pain, shortness of breath, or an irregular or fast heartbeat.  You are unable to urinate or you urinate less than normal.  You develop abnormal bleeding, such as bleeding from the rectum, vagina, nose, lungs, or nipples.  You vomit blood.  You have thoughts about harming yourself or committing suicide.  You are worried that you might harm someone else. This information is not intended to replace advice given to you by your health care provider. Make sure you discuss any questions you have with your health care provider. Document Released: 10/02/2007 Document Revised: 05/12/2016 Document Reviewed: 04/08/2014 Elsevier Interactive Patient Education  2018 [place] A goiter is an enlarged thyroid gland. The thyroid gland is located in the lower front of the neck. The gland produces hormones that regulate mood, body temperature, pulse rate, and digestion. Most goiters are painless and are not a cause for serious concern. Goiters and conditions that cause goiters can be treated, if necessary. What are the causes? Causes of this condition include:  Diseases that attack healthy cells in your body (autoimmune diseases) and affect your thyroid  function, such as: ? Graves disease. This causes too much thyroid hormone to be produced and it makes your thyroid overly active (hyperthyroidism). ? Hashimoto disease. This type of inflammation of the thyroid (thyroiditis) causes too little thyroid hormone to be produced and it makes your thyroid not active enough (hypothyroidism).  Other conditions that cause thyroiditis.  Nodular goiter. This means that there  are one or more small growths on your thyroid. These can create too much thyroid hormone.  Pregnancy.  Thyroid cancer. This is rare.  Certain medicines.  Radiation exposure.  Iodine deficiency.  In some cases, the cause may not be known (idiopathic). What increases the risk? This condition is more likely to develop in:  People who have a family history of goiter.  Women.  People who do not get enough iodine in their diet.  People who are older than 92.  People who smoke tobacco.  What are the signs or symptoms? Common symptoms of this condition include:  Swelling in the lower part of the neck. This swelling can range from a very small bump to a large lump.  A tight feeling in the throat.  A hoarse voice.  Other symptoms include:  Coughing.  Wheezing.  Difficulty swallowing.  Difficulty breathing.  Bulging neck veins.  Dizziness.  In some cases, there are no symptoms and thyroid hormone levels may be normal. When a goiter is the result of hyperthyroidism, symptoms may also include:  Nervousness or restlessness.  Inability to tolerate heat.  Unexplained weight loss.  Diarrhea.  Change in the texture of hair or skin.  Changes in heart beat, such as skipped beats, extra beats, or a rapid heart rate.  Loss of menstruation.  Shaky hands.  Increased appetite.  Sleep problems.  When a goiter is the result of hypothyroidism, symptoms may also include:  Feeling like you have no energy (lethargy).  Inability to tolerate cold.  Weight gain that is not explained by a change in diet or exercise habits.  Dry skin.  Coarse hair.  Menstrual irregularity.  Constipation.  Sadness or depression.  How is this diagnosed? This condition may be diagnosed with a medical history and physical exam. You may also have other tests, including:  Blood tests to check thyroid function.  Imaging tests, such as: ? Ultrasonography. ? CT  scan. ? MRI. ? Thyroid scan. You will be given a safe radioactive injection, then images will be taken of your thyroid.  Tissue sample (biopsy) of the goiter or any nodules. This checks to see if the goiter or nodules are cancerous.  How is this treated? Treatment for this condition depends on the cause. Treatment may include:  Medicines to control your thyroid.  Anti-inflammatory or steroid medicines, if inflammation is the cause.  Iodine supplements or changes in diet, if the goiter is caused by iodine deficiency.  Radiation therapy.  Surgery to remove your thyroid.  In some cases, no treatment is necessary, and your health care provider will monitor your condition at regular checkups. Follow these instructions at home:  Follow recommendations from your health care provider for any changes to your diet.  Take over-the-counter and prescription medicines only as told by your health care provider.  Do not use any tobacco products, including cigarettes, chewing tobacco, or e-cigarettes. If you need help quitting, ask your health care provider.  Keep all follow-up appointments as told by your health care provider. This is important. Contact a health care provider if:  Your symptoms do not get  better with treatment. Get help right away if:  You develop sudden, unexplained confusion or other mental changes.  You have nausea, vomiting, or diarrhea.  You develop a fever.  Your skin or the whites of your eyes appear yellow (jaundice).  You develop chest pain.  You have trouble breathing or swallowing.  You suddenly become very weak.  You experience extreme restlessness. This information is not intended to replace advice given to you by your health care provider. Make sure you discuss any questions you have with your health care provider. Document Released: 05/25/2010 Document Revised: 06/24/2016 Document Reviewed: 12/01/2014 Elsevier Interactive Patient Education  2018  Malin Years, Female Preventive care refers to lifestyle choices and visits with your health care provider that can promote health and wellness. What does preventive care include?  A yearly physical exam. This is also called an annual well check.  Dental exams once or twice a year.  Routine eye exams. Ask your health care provider how often you should have your eyes checked.  Personal lifestyle choices, including: ? Daily care of your teeth and gums. ? Regular physical activity. ? Eating a healthy diet. ? Avoiding tobacco and drug use. ? Limiting alcohol use. ? Practicing safe sex. ? Taking low-dose aspirin daily starting at age 46. ? Taking vitamin and mineral supplements as recommended by your health care provider. What happens during an annual well check? The services and screenings done by your health care provider during your annual well check will depend on your age, overall health, lifestyle risk factors, and family history of disease. Counseling Your health care provider may ask you questions about your:  Alcohol use.  Tobacco use.  Drug use.  Emotional well-being.  Home and relationship well-being.  Sexual activity.  Eating habits.  Work and work Statistician.  Method of birth control.  Menstrual cycle.  Pregnancy history.  Screening You may have the following tests or measurements:  Height, weight, and BMI.  Blood pressure.  Lipid and cholesterol levels. These may be checked every 5 years, or more frequently if you are over 32 years old.  Skin check.  Lung cancer screening. You may have this screening every year starting at age 55 if you have a 30-pack-year history of smoking and currently smoke or have quit within the past 15 years.  Fecal occult blood test (FOBT) of the stool. You may have this test every year starting at age 34.  Flexible sigmoidoscopy or colonoscopy. You may have a sigmoidoscopy every 5 years or  a colonoscopy every 10 years starting at age 55.  Hepatitis C blood test.  Hepatitis B blood test.  Sexually transmitted disease (STD) testing.  Diabetes screening. This is done by checking your blood sugar (glucose) after you have not eaten for a while (fasting). You may have this done every 1-3 years.  Mammogram. This may be done every 1-2 years. Talk to your health care provider about when you should start having regular mammograms. This may depend on whether you have a family history of breast cancer.  BRCA-related cancer screening. This may be done if you have a family history of breast, ovarian, tubal, or peritoneal cancers.  Pelvic exam and Pap test. This may be done every 3 years starting at age 59. Starting at age 53, this may be done every 5 years if you have a Pap test in combination with an HPV test.  Bone density scan. This is done to screen for osteoporosis. You  may have this scan if you are at high risk for osteoporosis.  Discuss your test results, treatment options, and if necessary, the need for more tests with your health care provider. Vaccines Your health care provider may recommend certain vaccines, such as:  Influenza vaccine. This is recommended every year.  Tetanus, diphtheria, and acellular pertussis (Tdap, Td) vaccine. You may need a Td booster every 10 years.  Varicella vaccine. You may need this if you have not been vaccinated.  Zoster vaccine. You may need this after age 30.  Measles, mumps, and rubella (MMR) vaccine. You may need at least one dose of MMR if you were born in 1957 or later. You may also need a second dose.  Pneumococcal 13-valent conjugate (PCV13) vaccine. You may need this if you have certain conditions and were not previously vaccinated.  Pneumococcal polysaccharide (PPSV23) vaccine. You may need one or two doses if you smoke cigarettes or if you have certain conditions.  Meningococcal vaccine. You may need this if you have certain  conditions.  Hepatitis A vaccine. You may need this if you have certain conditions or if you travel or work in places where you may be exposed to hepatitis A.  Hepatitis B vaccine. You may need this if you have certain conditions or if you travel or work in places where you may be exposed to hepatitis B.  Haemophilus influenzae type b (Hib) vaccine. You may need this if you have certain conditions.  Talk to your health care provider about which screenings and vaccines you need and how often you need them. This information is not intended to replace advice given to you by your health care provider. Make sure you discuss any questions you have with your health care provider. Document Released: 01/01/2016 Document Revised: 08/24/2016 Document Reviewed: 10/06/2015 Elsevier Interactive Patient Education  2017 [employer].

## 2017-09-25 NOTE — Progress Notes (Signed)
Patient presents to clinic today to establish care.  SUBJECTIVE: PMH: Pt is a 46 yo female with pmh sig for goiter.  Pt was formerly seen by [facility] on [address].  She was last seen there in 2014.  Pt did not have insurance for a while, hence her gap in care.  Pt is not taking any medications other than OTC gummy vitamin C and B12.  Pt has numerous concerns this visit.    Bladder concern: - times at least 2 years , decreased Volume , decreased frequency -no urge to urinate -pt has tried to increase po intake of water.  She does not drink soda or coffee -if pt drinks an increase amount of water, she can barely hold her urine -pt drinking >60 oz water per day -pt denies nighttime voiding -h/o 2 kidney infections in 1992 and after 2nd pregnancy  Bowel concern: -?IBS.  Started out as constipation now more loose stools-pieces, not watery -Also endorses bloating, gas, pt states she had "a budha belly" -tries to avoid gassy foods.  Tried taking a probiotic, but increased gas. -pt denies feeling hungry, but eats b/c she knows she should.  Endorses lost feeling after her 2nd child.  Attention concern: -pt endorses decreased concentration.  Notes it as a "challenge" -has become worse with increasing job responsibilities. -takes pt a long time to complete task -also notes symptoms at home.  Sciatica: -initially noted in March 2018 -pt states she went to an exercise class, then later that night tried some chicken but it did not agree with her (pt is pescetarian- had back and bowel pain) -since then pt endorsed pain in R buttock and in L buttock that radiated down into her L leg. -the pain was better with rest.  PT was recommended at the time, but pt could not keep going 2/2 insurance. -she has been using a standing desk at work and trying to stretch.  Allergies: NKDA  PSurgHx: None  Social Hx: Pt is currently working full time at [employer] [place].  She has 2 daughters.   Her 85 yo daughter is a on the autism spectrum.  PT does not smoke or use drugs.  FMHx: Mom- deceased, alcohol abuse, arthritis, cancer, depression drug abuse, early death, heart disease, mental illness, miscarriage, stroke   dad-alive ethanol abuse, cancer diabetes , kidney disease  Sister -Jamie- AAW  Brother , Craig-alive, birth defect, depression   brother Delray , alive  Brother ,Stacy-alive depression, learning disability, mental illness, mental retardation  Daughter Leah - asthma , depression   daughter Kylah- depression   MgM -deceased, arthritis, COPD, MI, HLD, HTN MGF-alive, arthritis, MI, HLD PGM- early death via suicide PGF -deceased ,  DM, early death   Past Medical History:  Diagnosis Date  . Goiter     History reviewed. No pertinent surgical history.  No current outpatient prescriptions on file prior to visit.   No current facility-administered medications on file prior to visit.     No Known Allergies  Family History  Problem Relation Age of Onset  . Uterine cancer Mother   . Pancreatic cancer Father   . Kidney failure Father     Social History   Social History  . Marital status: Single    Spouse name: N/A  . Number of children: N/A  . Years of education: N/A   Occupational History  . Not on file.   Social History Main Topics  . Smoking status: Never Smoker  .  Smokeless tobacco: Never Used  . Alcohol use Yes     Comment: ocass  . Drug use: No  . Sexual activity: Not on file   Other Topics Concern  . Not on file   Social History Narrative  . No narrative on file    ROS General: Denies fever, chills, night sweats, changes in weight    +no appetite, decreased attention HEENT: Denies headaches, ear pain, changes in vision, rhinorrhea, sore throat CV: Denies CP, palpitations, SOB, orthopnea Pulm: Denies SOB, cough, wheezing GI: Denies abdominal pain, nausea, vomiting    +diarrhea, constipation GU: Denies dysuria, hematuria, , vaginal  discharge  +decreased frequency, decreased volume Msk: Denies muscle cramps, joint pains  +sciatica Neuro: Denies weakness, numbness, tingling Skin: Denies rashes, bruising Psych: Denies depression, anxiety, hallucinations  BP 100/76 (BP Location: Left Arm, Patient Position: Sitting, Cuff Size: Normal)   Pulse 71   Temp 98 F (36.7 C) (Oral)   Ht 5' 2.5" (1.588 m)   Wt 128 lb 1.6 oz (58.1 kg)   LMP 09/02/2017 (Approximate) Comment: Another cycle started 09/12/2017  BMI 23.06 kg/m   Physical Exam Gen. Pleasant, well developed, well-nourished, in NAD HEENT - Patrick/AT, PERRL, EOMI, conjunctive clear, no scleral icterus, no nasal drainage, pharynx without erythema or exudate. Neck: No JVD, no thyromegaly Lungs: no use of accessory muscles, CTAB, no wheezes, rales or rhonchi Cardiovascular: RRR, No r/g/m, no peripheral edema Abdomen: BS present, soft, nontender,nondistended, no hepatosplenomegaly Musculoskeletal: No deformities, moves all four extremities, no cyanosis or clubbing, normal tone Neuro:  A&Ox3, CN II-XII intact, normal gait Skin:  Warm, dry, intact, no lesions Psych: normal affect, mood appropriate  No results found for this or any previous visit (from the past 2160 hour(s)).  Assessment/Plan: Screening for cholesterol level  - Plan: Lipid panel  Goiter - Plan: TSH, T4, free -Pt trying to schedule appt with Endo, Dr. Dwyane Dee.  Will wait to order u/s.  Lethargy  - Plan: CBC with Differential/Platelet, Comprehensive metabolic panel, VITAMIN D 25 Hydroxy (Vit-D Deficiency, Fractures), CBC with Differential/Platelet, VITAMIN D 25 Hydroxy (Vit-D Deficiency, Fractures)  Attention and concentration deficit -Given handout -Given info about scheduling appointment for adult ADD testing. -Will discuss again at future appt.  Encounter to establish care -records release   Need for immunization against influenza  - Plan: Flu Vaccine QUAD 36+ mos IM     F/u in next month.

## 2017-09-29 ENCOUNTER — Other Ambulatory Visit: Payer: Self-pay | Admitting: Family Medicine

## 2017-09-29 MED ORDER — VITAMIN D (ERGOCALCIFEROL) 1.25 MG (50000 UNIT) PO CAPS: 50000.0000 [IU] | ORAL_CAPSULE | ORAL | 0 refills | Status: DC

## 2017-10-09 ENCOUNTER — Ambulatory Visit (INDEPENDENT_AMBULATORY_CARE_PROVIDER_SITE_OTHER): Payer: PRIVATE HEALTH INSURANCE | Admitting: Family Medicine

## 2017-10-09 ENCOUNTER — Encounter: Payer: Self-pay | Admitting: Family Medicine

## 2017-10-09 VITALS — BP 100/76 | HR 86 | Temp 98.3°F | Wt 130.0 lb

## 2017-10-09 DIAGNOSIS — Z114 Encounter for screening for human immunodeficiency virus [HIV]: Secondary | ICD-10-CM

## 2017-10-09 DIAGNOSIS — G43409 Hemiplegic migraine, not intractable, without status migrainosus: Secondary | ICD-10-CM | POA: Diagnosis not present

## 2017-10-09 MED ORDER — VITAMIN D (ERGOCALCIFEROL) 1.25 MG (50000 UNIT) PO CAPS: 50000.0000 [IU] | ORAL_CAPSULE | ORAL | 0 refills | Status: DC

## 2017-10-09 NOTE — Progress Notes (Signed)
Subjective:    Patient ID: Whitney Kent, female    DOB: July 22, 1971, 46 y.o.   MRN: [id number]  No chief complaint on file.   HPI Patient was seen today for f/u on acute concerns.  H/o Migraines: -hemiplegic migraines -now happening 1-2 x/wk -thinks the increase in HAs attributed to increased "screen time" at the computer at work and increased stress.  Pt's brother is trying to avoid foreclosure. -endorses increased tension in neck -has seen [facility] in the past, would like referral.  HIV screen -pt requesting testing. -has upcoming Gyn appt -has scheduled a 3-D mamogram  Vitamin D def.: -dx'd at last [facility] -Pt was told by pharmacy they did not have the rx -rx in computer as received, but will re-send -pt started taking an OTC Vit D since she couldn't get the rx filled.  Past Medical History:  Diagnosis Date  . Goiter   . Umbilical hernia     No Known Allergies  ROS General: Denies fever, chills, night sweats, changes in weight, changes in appetite HEENT: Denies ear pain, changes in vision, rhinorrhea, sore throat  +HAs CV: Denies CP, palpitations, SOB, orthopnea Pulm: Denies SOB, cough, wheezing GI: Denies abdominal pain, nausea, vomiting, diarrhea, constipation GU: Denies dysuria, hematuria, frequency, vaginal discharge Msk: Denies muscle cramps, joint pains Neuro: Denies weakness, numbness, tingling Skin: Denies rashes, bruising Psych: Denies depression, anxiety, hallucinations     Objective:    Blood pressure 100/76, pulse 86, temperature 98.3 F (36.8 C), temperature source Oral, weight 130 lb (59 kg).   Gen. Pleasant, well-nourished, in no distress, normal affect HEENT: /AT, face symmetric, no scleral icterus, PERRLA, nares patent without drainage, pharynx without erythema or exudate. Lungs: no accessory muscle use, CTAB, no wheezes or rales Cardiovascular: RRR, no m/r/g, no peripheral edema Musculoskeletal: No deformities, no  cyanosis or clubbing, normal tone Neuro:  A&Ox3, CN II-XII intact, normal gait Skin:  Warm, no lesions/ rash   Wt Readings from Last 3 Encounters:  10/09/17 130 lb (59 kg)  09/25/17 128 lb 1.6 oz (58.1 kg)  04/06/17 117 lb (53.1 kg)    Lab Results  Component Value Date   WBC 7.0 09/25/2017   HGB 13.4 09/25/2017   HCT 40.6 09/25/2017   PLT 236.0 09/25/2017   GLUCOSE 97 09/25/2017   CHOL 160 09/25/2017   TRIG 89.0 09/25/2017   HDL 86.60 09/25/2017   LDLCALC 55 09/25/2017   ALT 6 09/25/2017   AST 12 09/25/2017   NA 137 09/25/2017   K 4.2 09/25/2017   CL 103 09/25/2017   CREATININE 0.67 09/25/2017   BUN 9 09/25/2017   CO2 28 09/25/2017   TSH 0.74 09/25/2017    Assessment/Plan:  Hemiplegic migraine without status migrainosus, not intractable -Discussed increasing po intake of water, getting plenty of rest, and reducing stress  - Plan: Ambulatory referral to Neurology  Screening for HIV (human immunodeficiency virus)  - Plan: HIV antibody (with reflex)  F/u prn for chronic concerns mentioned in previous visit. (bladder, back, etc)

## 2017-10-09 NOTE — Patient Instructions (Addendum)
Cervical Strain and Sprain Rehab Ask your health care provider which exercises are safe for you. Do exercises exactly as told by your health care provider and adjust them as directed. It is normal to feel mild stretching, pulling, tightness, or discomfort as you do these exercises, but you should stop right away if you feel sudden pain or your pain gets worse.Do not begin these exercises until told by your health care provider. Stretching and range of motion exercises These exercises warm up your muscles and joints and improve the movement and flexibility of your neck. These exercises also help to relieve pain, numbness, and tingling. Exercise A: Cervical side bend  1. Using good posture, sit on a stable chair or stand up. 2. Without moving your shoulders, slowly tilt your left / right ear to your shoulder until you feel a stretch in your neck muscles. You should be looking straight ahead. 3. Hold for __________ seconds. 4. Repeat with the other side of your neck. Repeat __________ times. Complete this exercise __________ times a day. Exercise B: Cervical rotation  1. Using good posture, sit on a stable chair or stand up. 2. Slowly turn your head to the side as if you are looking over your left / right shoulder. ? Keep your eyes level with the ground. ? Stop when you feel a stretch along the side and the back of your neck. 3. Hold for __________ seconds. 4. Repeat this by turning to your other side. Repeat __________ times. Complete this exercise __________ times a day. Exercise C: Thoracic extension and pectoral stretch 1. Roll a towel or a small blanket so it is about 4 inches (10 cm) in diameter. 2. Lie down on your back on a firm surface. 3. Put the towel lengthwise, under your spine in the middle of your back. It should not be not under your shoulder blades. The towel should line up with your spine from your middle back to your lower back. 4. Put your hands behind your head and let your  elbows fall out to your sides. 5. Hold for __________ seconds. Repeat __________ times. Complete this exercise __________ times a day. Strengthening exercises These exercises build strength and endurance in your neck. Endurance is the ability to use your muscles for a long time, even after your muscles get tired. Exercise D: Upper cervical flexion, isometric 1. Lie on your back with a thin pillow behind your head and a small rolled-up towel under your neck. 2. Gently tuck your chin toward your chest and nod your head down to look toward your feet. Do not lift your head off the pillow. 3. Hold for __________ seconds. 4. Release the tension slowly. Relax your neck muscles completely before you repeat this exercise. Repeat __________ times. Complete this exercise __________ times a day. Exercise E: Cervical extension, isometric  1. Stand about 6 inches (15 cm) away from a wall, with your back facing the wall. 2. Place a soft object, about 6-8 inches (15-20 cm) in diameter, between the back of your head and the wall. A soft object could be a small pillow, a ball, or a folded towel. 3. Gently tilt your head back and press into the soft object. Keep your jaw and forehead relaxed. 4. Hold for __________ seconds. 5. Release the tension slowly. Relax your neck muscles completely before you repeat this exercise. Repeat __________ times. Complete this exercise __________ times a day. Posture and body mechanics  Body mechanics refers to the movements and positions of   your body while you do your daily activities. Posture is part of body mechanics. Good posture and healthy body mechanics can help to relieve stress in your body's tissues and joints. Good posture means that your spine is in its natural S-curve position (your spine is neutral), your shoulders are pulled back slightly, and your head is not tipped forward. The following are general guidelines for applying improved posture and body mechanics to  your everyday activities. Standing  When standing, keep your spine neutral and keep your feet about hip-width apart. Keep a slight bend in your knees. Your ears, shoulders, and hips should line up.  When you do a task in which you stand in one place for a long time, place one foot up on a stable object that is 2-4 inches (5-10 cm) high, such as a footstool. This helps keep your spine neutral. Sitting   When sitting, keep your spine neutral and your keep feet flat on the floor. Use a footrest, if necessary, and keep your thighs parallel to the floor. Avoid rounding your shoulders, and avoid tilting your head forward.  When working at a desk or a computer, keep your desk at a height where your hands are slightly lower than your elbows. Slide your chair under your desk so you are close enough to maintain good posture.  When working at a computer, place your monitor at a height where you are looking straight ahead and you do not have to tilt your head forward or downward to look at the screen. Resting When lying down and resting, avoid positions that are most painful for you. Try to support your neck in a neutral position. You can use a contour pillow or a small rolled-up towel. Your pillow should support your neck but not push on it. This information is not intended to replace advice given to you by your health care provider. Make sure you discuss any questions you have with your health care provider. Document Released: 12/05/2005 Document Revised: 08/11/2016 Document Reviewed: 11/11/2015 Elsevier Interactive Patient Education  2018 [employer].  Neck Exercises Neck exercises can be important for many reasons:  They can help you to improve and maintain flexibility in your neck. This can be especially important as you age.  They can help to make your neck stronger. This can make movement easier.  They can reduce or prevent neck pain.  They may help your upper back.  Ask your health care  provider which neck exercises would be best for you. Exercises Neck Press Repeat this exercise 10 times. Do it first thing in the morning and right before bed or as told by your health care provider. 1. Lie on your back on a firm bed or on the floor with a pillow under your head. 2. Use your neck muscles to push your head down on the pillow and straighten your spine. 3. Hold the position as well as you can. Keep your head facing up and your chin tucked. 4. Slowly count to 5 while holding this position. 5. Relax for a few seconds. Then repeat.  Isometric Strengthening Do a full set of these exercises 2 times a day or as told by your health care provider. 1. Sit in a supportive chair and place your hand on your forehead. 2. Push forward with your head and neck while pushing back with your hand. Hold for 10 seconds. 3. Relax. Then repeat the exercise 3 times. 4. Next, do thesequence again, this time putting your hand against  the back of your head. Use your head and neck to push backward against the hand pressure. 5. Finally, do the same exercise on either side of your head, pushing sideways against the pressure of your hand.  Prone Head Lifts Repeat this exercise 5 times. Do this 2 times a day or as told by your health care provider. 1. Lie face-down, resting on your elbows so that your chest and upper back are raised. 2. Start with your head facing downward, near your chest. Position your chin either on or near your chest. 3. Slowly lift your head upward. Lift until you are looking straight ahead. Then continue lifting your head as far back as you can stretch. 4. Hold your head up for 5 seconds. Then slowly lower it to your starting position.  Supine Head Lifts Repeat this exercise 8-10 times. Do this 2 times a day or as told by your health care provider. 1. Lie on your back, bending your knees to point to the ceiling and keeping your feet flat on the floor. 2. Lift your head slowly off  the floor, raising your chin toward your chest. 3. Hold for 5 seconds. 4. Relax and repeat.  Scapular Retraction Repeat this exercise 5 times. Do this 2 times a day or as told by your health care provider. 1. Stand with your arms at your sides. Look straight ahead. 2. Slowly pull both shoulders backward and downward until you feel a stretch between your shoulder blades in your upper back. 3. Hold for 10-30 seconds. 4. Relax and repeat.  Contact a health care provider if:  Your neck pain or discomfort gets much worse when you do an exercise.  Your neck pain or discomfort does not improve within 2 hours after you exercise. If you have any of these problems, stop exercising right away. Do not do the exercises again unless your health care provider says that you can. Get help right away if:  You develop sudden, severe neck pain. If this happens, stop exercising right away. Do not do the exercises again unless your health care provider says that you can. Exercises Neck Stretch  Repeat this exercise 3-5 times. 1. Do this exercise while standing or while sitting in a chair. 2. Place your feet flat on the floor, shoulder-width apart. 3. Slowly turn your head to the right. Turn it all the way to the right so you can look over your right shoulder. Do not tilt or tip your head. 4. Hold this position for 10-30 seconds. 5. Slowly turn your head to the left, to look over your left shoulder. 6. Hold this position for 10-30 seconds.  Neck Retraction Repeat this exercise 8-10 times. Do this 3-4 times a day or as told by your health care provider. 1. Do this exercise while standing or while sitting in a sturdy chair. 2. Look straight ahead. Do not bend your neck. 3. Use your fingers to push your chin backward. Do not bend your neck for this movement. Continue to face straight ahead. If you are doing the exercise properly, you will feel a slight sensation in your throat and a stretch at the back of  your neck. 4. Hold the stretch for 1-2 seconds. Relax and repeat.  This information is not intended to replace advice given to you by your health care provider. Make sure you discuss any questions you have with your health care provider. Document Released: 11/16/2015 Document Revised: 05/12/2016 Document Reviewed: 06/15/2015 Elsevier Interactive Patient Education  2017  [employer]  Recurrent Migraine Headache A migraine headache is very bad, throbbing pain that is usually on one side of your head. Recurrent migraines keep coming back (recurring). Talk with your doctor about what things may bring on (trigger) your migraine headaches. Follow these instructions at home: Medicines  Take over-the-counter and prescription medicines only as told by your doctor.  Do not drive or use heavy machinery while taking prescription pain medicine. Lifestyle  Do not use any products that contain nicotine or tobacco, such as cigarettes and e-cigarettes. If you need help quitting, ask your doctor.  Limit alcohol intake to no more than 1 drink a day for nonpregnant women and 2 drinks a day for men. One drink equals 12 oz of beer, 5 oz of wine, or 1 oz of hard liquor.  Get 7-9 hours of sleep each night.  Lessen any stress in your life. Ask your doctor about ways to lower your stress.  Stay at a healthy weight. Talk with your doctor if you need help losing weight.  Get regular exercise. General instructions  Keep a journal to find out if certain things bring on migraine headaches. For example, write down: ? What you eat and drink. ? How much sleep you get. ? Any change to your diet or medicines.  Lie down in a dark, quiet room when you have a migraine.  Try placing a cool towel over your head when you have a migraine.  Keep lights dim if bright lights bother you or make your migraines worse.  Keep all follow-up visits as told by your doctor. This is important. Contact a doctor if:  Medicine  does not help your migraines.  Your pain keeps coming back.  You have a fever.  You have weight loss without trying. Get help right away if:  Your migraine becomes really bad and medicine does not help.  You have a stiff neck.  You have trouble seeing.  Your muscles are weak or you lose control of your muscles.  You lose your balance or have trouble walking.  You feel like you will pass out (faint) or you pass out.  You have really bad symptoms that are different than your first symptoms.  You start having sudden, very bad headaches that last for one second or less, like a thunderclap. Summary  A migraine headache is very bad, throbbing pain that is usually on one side of your head.  Talk with your doctor about what things may bring on (trigger) your migraine headaches.  Take over-the-counter and prescription medicines only as told by your doctor.  Lie down in a dark, quiet room when you have a migraine.  Keep a journal about what you eat and drink, how much sleep you get, and any changes to your medicines. This can help you find out if certain things make you have migraine headaches. This information is not intended to replace advice given to you by your health care provider. Make sure you discuss any questions you have with your health care provider. Document Released: 09/13/2008 Document Revised: 10/28/2016 Document Reviewed: 10/28/2016 Elsevier Interactive Patient Education  2017 [employer].

## 2017-10-10 LAB — HIV ANTIBODY (ROUTINE TESTING W REFLEX): HIV 1&2 Ab, 4th Generation: NONREACTIVE

## 2017-11-02 ENCOUNTER — Encounter: Payer: Self-pay | Admitting: *Deleted

## 2017-11-06 ENCOUNTER — Telehealth: Payer: Self-pay | Admitting: Family Medicine

## 2017-11-06 NOTE — Telephone Encounter (Signed)
Rx request for Zoloft, not under current medications

## 2017-11-06 NOTE — Telephone Encounter (Signed)
Copied from [place] [phone]. Topic: Quick Communication - See Telephone Encounter >> Nov 06, 2017  4:20 PM Burnis Medin, NT wrote: CRM for notification. See Telephone encounter for: Pt. Is calling in about seeing if she can get a new prescription for Zoloft. Pt. Went to counselor and she recommended. Pt. Has signed a release of information over to the office. Counselor name is Izora Gala. Pt. Use [facility] on [address]   11/06/17.

## 2017-11-07 NOTE — Telephone Encounter (Signed)
I looked over your last note and didn't see anything mentioning this medication also I didn't see in medication list or d/c medication. Would you like for patient to schedule an appointment to discuss the reasoning for this medication? Please advise.

## 2017-11-07 NOTE — Telephone Encounter (Signed)
I don't recall anything about celexa, nor can I find it in the chart.  Please have pt come in for a f/u appt so we can discuss this further.  Thanks

## 2017-11-08 ENCOUNTER — Other Ambulatory Visit: Payer: Self-pay | Admitting: Emergency Medicine

## 2017-11-08 MED ORDER — CITALOPRAM HYDROBROMIDE 20 MG PO TABS: 20.0000 mg | ORAL_TABLET | Freq: Every day | ORAL | 0 refills | Status: DC

## 2017-11-08 NOTE — Telephone Encounter (Signed)
Patient states that she has not taken this medication but she has taken Prozac about 5 years ago. Patient states that her counselor wanted her to try taking Celexa to see if that would help patient any. Per Dr. Volanda Napoleon okay to send in medication and wants patient to f/u in two weeks to see if medication is helping patient any. Patient understands and has no further questions or concerns.

## 2017-11-08 NOTE — Telephone Encounter (Signed)
Medication has been sent to patient pharmacy.

## 2017-11-14 ENCOUNTER — Ambulatory Visit: Payer: PRIVATE HEALTH INSURANCE | Admitting: Gastroenterology

## 2017-11-14 ENCOUNTER — Other Ambulatory Visit (INDEPENDENT_AMBULATORY_CARE_PROVIDER_SITE_OTHER): Payer: PRIVATE HEALTH INSURANCE

## 2017-11-14 ENCOUNTER — Encounter: Payer: Self-pay | Admitting: Gastroenterology

## 2017-11-14 VITALS — BP 110/70 | HR 84 | Ht 62.0 in | Wt 132.2 lb

## 2017-11-14 DIAGNOSIS — Z800 Family history of malignant neoplasm of digestive organs: Secondary | ICD-10-CM

## 2017-11-14 DIAGNOSIS — R1033 Periumbilical pain: Secondary | ICD-10-CM

## 2017-11-14 DIAGNOSIS — R194 Change in bowel habit: Secondary | ICD-10-CM

## 2017-11-14 DIAGNOSIS — R109 Unspecified abdominal pain: Secondary | ICD-10-CM

## 2017-11-14 DIAGNOSIS — R140 Abdominal distension (gaseous): Secondary | ICD-10-CM

## 2017-11-14 DIAGNOSIS — K219 Gastro-esophageal reflux disease without esophagitis: Secondary | ICD-10-CM

## 2017-11-14 LAB — IGA: IGA: 84 mg/dL (ref 68–378)

## 2017-11-14 MED ORDER — OMEPRAZOLE 20 MG PO CPDR: 20.0000 mg | DELAYED_RELEASE_CAPSULE | Freq: Every day | ORAL | 1 refills | Status: DC

## 2017-11-14 MED ORDER — SUPREP BOWEL PREP KIT 17.5-3.13-1.6 GM/177ML PO SOLN: ORAL | 0 refills | Status: DC

## 2017-11-14 MED ORDER — AMBULATORY NON FORMULARY MEDICATION
0 refills | Status: DC
Start: 2017-11-14 — End: 2020-11-20

## 2017-11-14 NOTE — Patient Instructions (Addendum)
If you are age 44 or older, your body mass index should be between 23-30. Your Body mass index is 24.18 kg/m. If this is out of the aforementioned range listed, please consider follow up with your Primary Care Provider.  If you are age 34 or younger, your body mass index should be between 19-25. Your Body mass index is 24.18 kg/m. If this is out of the aformentioned range listed, please consider follow up with your Primary Care Provider.  You have been scheduled for a colonoscopy. Please follow written instructions given to you at your visit today.  Please pick up your prep supplies at the pharmacy within the next 1-3 days. If you use inhalers (even only as needed), please bring them with you on the day of your procedure. Your physician has requested that you go to www.startemmi.com and enter the access code given to you at your visit today. This web site gives a general overview about your procedure. However, you should still follow specific instructions given to you by our office regarding your preparation for the procedure.  We have sent the following medications to your pharmacy for you to pick up at your convenience: Omeprazole  We have given you samples of the following medication to take: IBgard. You can purchase these over the counter if you find them helpful.  Your physician has requested that you go to the basement for the following lab work before leaving today: IGA, TTG  We have given you a Low Fod-Map diet to follow   Thank you.

## 2017-11-14 NOTE — Progress Notes (Signed)
HPI :  46 y/o female with minimal past medical history as outlined below, here for a new patient visit for multiple GI complaints.  She reports she has had a sensitive stomach with abdominal pains and altered bowels since childhood. Her constellation of symptoms includes periumbilical abdominal pain. This comes and goes and is associated with abdominal distention and bloating. She fields her discomfort is often improved with a bowel movement. In general she has thought that certain foods can precipitate symptoms, such as potatoes, popcorn, french fries. She has been on a pescatarian diet which she thinks has helped slightly with gas / bloating. She endorses decreased appetite sometimes due to these symptoms. She does endorse some reflux in regards to regurgitation and belching gastric contents. She endorses a history of bulimia in high school however that behavior has not occurred for several years.  She endorses rare constipation and more so loose stools. She reports her stools are very foul-smelling, and dark brown in color. Denies black tarry stools and denies any blood in her stools. She thinks loose stools predominate about 70-80% of the time. She denies any routine NSAID use. She denies any family history of celiac disease or Crohn's disease.. She states her father was recently diagnosed with colon cancer in his mid 54s. She denies any history of prior colonoscopy. She has tried some probiotics in the past few weeks, unclear if this is provided her much benefit to date yet.  Of note she had a CT scan of the abdomen in May of this year which was normal, done to evaluate these symptoms  While the symptoms have been ongoing for a long time they have been bothering her more so recently.  CT abdomen / pelvis - 04/25/2017 - normal US abdomen 12/1999 - suboptimally distended gallbladder, otherwise normal  Past Medical History:  Diagnosis Date  . Goiter   . Umbilical hernia   . Vitamin D deficiency       History reviewed. No pertinent surgical history. Family History  Problem Relation Age of Onset  . Uterine cancer Mother   . Alcohol abuse Mother   . Arthritis Mother   . Depression Mother   . Drug abuse Mother   . Heart disease Mother   . Mental illness Mother   . Stroke Mother   . Pancreatic cancer Father   . Kidney failure Father   . Alcohol abuse Father   . Diabetes Father   . Prostate cancer Father   . Colon cancer Father   . Depression Brother   . Arthritis Maternal Grandmother   . COPD Maternal Grandmother   . Heart attack Maternal Grandmother   . Hypertension Maternal Grandmother   . Hyperlipidemia Maternal Grandmother   . Arthritis Maternal Grandfather   . Heart attack Maternal Grandfather   . Hyperlipidemia Maternal Grandfather   . Diabetes Paternal Grandfather   . Depression Daughter   . Asthma Daughter   . Depression Daughter   . Autism Daughter    Social History   Tobacco Use  . Smoking status: Never Smoker  . Smokeless tobacco: Never Used  Substance Use Topics  . Alcohol use: Yes    Comment: ocas  . Drug use: No   Current Outpatient Medications  Medication Sig Dispense Refill  . AMBULATORY NON FORMULARY MEDICATION Medication Name:  Dr Holli Humbles Probiotics- Prebiotics, Probiotics, Postbiotics-1 capsule daily    . Ascorbic Acid (VITAMIN C PO) Take 1 tablet by mouth daily.     . citalopram (CELEXA)  20 MG tablet Take 1 tablet (20 mg total) by mouth daily. 90 tablet 0  . CYANOCOBALAMIN PO Take 1 tablet by mouth daily.     . Multiple Vitamin (MULTIVITAMIN) tablet Take 1 tablet by mouth daily.    . Vitamin D, Ergocalciferol, (DRISDOL) 50000 units CAPS capsule Take 1 capsule (50,000 Units total) by mouth every 7 (seven) days. 8 capsule 0  . AMBULATORY NON FORMULARY MEDICATION Medication Name: IBgard - Take as directed 12 capsule 0  . etonogestrel-ethinyl estradiol (NUVARING) 0.12-0.015 MG/24HR vaginal ring Place 1 each vaginally every 28 (twenty-eight)  days. Insert vaginally and leave in place for 3 consecutive weeks, then remove for 1 week.    Marland Kitchen omeprazole (PRILOSEC) 20 MG capsule Take 1 capsule (20 mg total) by mouth daily. 60 capsule 1  . SUPREP BOWEL PREP KIT 17.5-3.13-1.6 GM/177ML SOLN Suprep-Use as directed 354 mL 0   No current facility-administered medications for this visit.    No Known Allergies   Review of Systems: All systems reviewed and negative except where noted in HPI.   Lab Results  Component Value Date   WBC 7.0 09/25/2017   HGB 13.4 09/25/2017   HCT 40.6 09/25/2017   MCV 82.8 09/25/2017   PLT 236.0 09/25/2017   Lab Results  Component Value Date   CREATININE 0.67 09/25/2017   BUN 9 09/25/2017   NA 137 09/25/2017   K 4.2 09/25/2017   CL 103 09/25/2017   CO2 28 09/25/2017    Lab Results  Component Value Date   ALT 6 09/25/2017   AST 12 09/25/2017   ALKPHOS 43 09/25/2017   BILITOT 0.3 09/25/2017   Lab Results  Component Value Date   TSH 0.74 09/25/2017      Physical Exam: BP 110/70   Pulse 84   Ht _0  (1.575 m)   Wt 132 lb 3.2 oz (60 kg)   BMI 24.18 kg/m  Constitutional: Pleasant,well-developed, female in no acute distress. HEENT: Normocephalic and atraumatic. Conjunctivae are normal. No scleral icterus. Neck supple.  Cardiovascular: Normal rate, regular rhythm.  Pulmonary/chest: Effort normal and breath sounds normal. No wheezing, rales or rhonchi. Abdominal: Soft, nondistended, nontender. . There are no masses palpable. No hepatomegaly. Extremities: no edema Lymphadenopathy: No cervical adenopathy noted. Neurological: Alert and oriented to person place and time. Skin: Skin is warm and dry. No rashes noted. Psychiatric: Normal mood and affect. Behavior is normal.   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 Symptoms stable over time with normal labs, although I do feel like she should be evaluated for celiac disease given her dietary intolerances. Will screen for celiac with serology. I counseled her on irritable bowel syndrome, and intestinal gas and bloating and discussed treatment options. She is eating a lot of fruits and vegetables which may be leading to some of her symptoms depending on what she is eating. Counseled her on a low FODMAP diet and she will try this. I otherwise would give her a trial of IB gard to see if this helps. If it does not we may consider a course of rifaximin. If she feels probiotics are helping her she can continue them, if they are not she can stop them at this time. As below, due for colonoscopy for her family history of colon cancer,  not symptoms, but will ensure no evidence of IBD.  GERD - recommend trial of omeprazole 20 mg once daily to see if this helps make her feel better. Further recommendations pending her course on this regimen.  Family history colon cancer - her father was recently diagnosed with colon cancer, she is due for screening colonoscopy at this time. Following discussion of risks and benefits she was agreeable to proceed.  Dewey-Humboldt Cellar, MD [facility] Pager [phone]

## 2017-11-15 ENCOUNTER — Encounter: Payer: Self-pay | Admitting: Gastroenterology

## 2017-11-15 LAB — TISSUE TRANSGLUTAMINASE, IGA: (tTG) Ab, IgA: 1 U/mL

## 2017-11-20 ENCOUNTER — Ambulatory Visit (AMBULATORY_SURGERY_CENTER): Payer: PRIVATE HEALTH INSURANCE | Admitting: Gastroenterology

## 2017-11-20 ENCOUNTER — Encounter: Payer: Self-pay | Admitting: Gastroenterology

## 2017-11-20 VITALS — BP 111/69 | HR 77 | Temp 98.6°F | Resp 14 | Ht 62.0 in | Wt 132.0 lb

## 2017-11-20 DIAGNOSIS — Z800 Family history of malignant neoplasm of digestive organs: Secondary | ICD-10-CM

## 2017-11-20 DIAGNOSIS — R194 Change in bowel habit: Secondary | ICD-10-CM

## 2017-11-20 DIAGNOSIS — D120 Benign neoplasm of cecum: Secondary | ICD-10-CM | POA: Diagnosis not present

## 2017-11-20 MED ORDER — SODIUM CHLORIDE 0.9 % IV SOLN: 500.0000 mL | INTRAVENOUS | Status: DC

## 2017-11-20 NOTE — Progress Notes (Signed)
A/ox3 pleased with MAC, report to [employer]

## 2017-11-20 NOTE — Progress Notes (Signed)
Pt eats a vegetarian diet.  She does not eat eggs.  To her knowledge no allergy per pt.  maw

## 2017-11-20 NOTE — Patient Instructions (Signed)
Impression/Recommendations:  Polyp handout given to patient. Diverticulosis handout given to patient.  Resume previous diet. Continue present medications.  Repeat colonoscopy recommended for surveillance.  Date to be determined after pathology results reviewed.  YOU HAD AN ENDOSCOPIC PROCEDURE TODAY AT THE [facility]:   Refer to the procedure report that was given to you for any specific questions about what was found during the examination.  If the procedure report does not answer your questions, please call your gastroenterologist to clarify.  If you requested that your care partner not be given the details of your procedure findings, then the procedure report has been included in a sealed envelope for you to review at your convenience later.  YOU SHOULD EXPECT: Some feelings of bloating in the abdomen. Passage of more gas than usual.  Walking can help get rid of the air that was put into your GI tract during the procedure and reduce the bloating. If you had a lower endoscopy (such as a colonoscopy or flexible sigmoidoscopy) you may notice spotting of blood in your stool or on the toilet paper. If you underwent a bowel prep for your procedure, you may not have a normal bowel movement for a few days.  Please Note:  You might notice some irritation and congestion in your nose or some drainage.  This is from the oxygen used during your procedure.  There is no need for concern and it should clear up in a day or so.  SYMPTOMS TO REPORT IMMEDIATELY:   Following lower endoscopy (colonoscopy or flexible sigmoidoscopy):  Excessive amounts of blood in the stool  Significant tenderness or worsening of abdominal pains  Swelling of the abdomen that is new, acute  Fever of 100F or higher  For urgent or emergent issues, a gastroenterologist can be reached at any hour by calling [phone].   DIET:  We do recommend a small meal at first, but then you may proceed to your regular diet.   Drink plenty of fluids but you should avoid alcoholic beverages for 24 hours.  ACTIVITY:  You should plan to take it easy for the rest of today and you should NOT DRIVE or use heavy machinery until tomorrow (because of the sedation medicines used during the test).    FOLLOW UP: Our staff will call the number listed on your records the next business day following your procedure to check on you and address any questions or concerns that you may have regarding the information given to you following your procedure. If we do not reach you, we will leave a message.  However, if you are feeling well and you are not experiencing any problems, there is no need to return our call.  We will assume that you have returned to your regular daily activities without incident.  If any biopsies were taken you will be contacted by phone or by letter within the next 1-3 weeks.  Please call us at [phone] if you have not heard about the biopsies in 3 weeks.    SIGNATURES/CONFIDENTIALITY: You and/or your care partner have signed paperwork which will be entered into your electronic medical record.  These signatures attest to the fact that that the information above on your After Visit Summary has been reviewed and is understood.  Full responsibility of the confidentiality of this discharge information lies with you and/or your care-partner. 

## 2017-11-20 NOTE — Progress Notes (Signed)
Called to room to assist during endoscopic procedure.  Patient ID and intended procedure confirmed with present staff. Received instructions for my participation in the procedure from the performing physician.  

## 2017-11-20 NOTE — Op Note (Signed)
[place] Patient Name: Whitney Kent Procedure Date: 11/20/2017 4:13 PM MRN: [id number] Endoscopist: Remo Lipps P. Lanaya Bennis MD, MD Age: 46 Referring MD:  Date of Birth: July 08, 1971 Gender: Female Account #: [id number] Procedure:                Colonoscopy Indications:              Screening patient at increased risk: Family history                            of 1st-degree relative with colorectal cancer                            (father diagnosed age 76s), patient also with                            altered bowel habits (intermittent loose stools) Medicines:                Monitored Anesthesia Care Procedure:                Pre-Anesthesia Assessment:                           - Prior to the procedure, a History and Physical                            was performed, and patient medications and                            allergies were reviewed. The patient's tolerance of                            previous anesthesia was also reviewed. The risks                            and benefits of the procedure and the sedation                            options and risks were discussed with the patient.                            All questions were answered, and informed consent                            was obtained. Prior Anticoagulants: The patient has                            taken no previous anticoagulant or antiplatelet                            agents. ASA Grade Assessment: I - A normal, healthy                            patient. After reviewing the risks and benefits,  the patient was deemed in satisfactory condition to                            undergo the procedure.                           After obtaining informed consent, the colonoscope                            was passed under direct vision. Throughout the                            procedure, the patient's blood pressure, pulse, and                            oxygen saturations  were monitored continuously. The                            Model PCF-[id number]) scope was introduced                            through the anus and advanced to the the terminal                            ileum, with identification of the appendiceal                            orifice and IC valve. The colonoscopy was performed                            without difficulty. The patient tolerated the                            procedure well. The quality of the bowel                            preparation was good. The terminal ileum, ileocecal                            valve, appendiceal orifice, and rectum were                            photographed. Scope In: 4:26:40 PM Scope Out: 4:48:13 PM Scope Withdrawal Time: 0 hours 18 minutes 10 seconds  Total Procedure Duration: 0 hours 21 minutes 33 seconds  Findings:                 The perianal and digital rectal examinations were                            normal.                           The terminal ileum appeared normal.  A few medium-mouthed diverticula were found in the                            cecum and left colon.                           A 3 mm polyp was found in the cecum. The polyp was                            sessile. The polyp was removed with a cold biopsy                            forceps. Resection and retrieval were complete.                           The exam was otherwise normal throughout the                            examined colon.                           Biopsies for histology were taken with a cold                            forceps from the right colon, left colon and                            transverse colon for evaluation of microscopic                            colitis. Complications:            No immediate complications. Estimated blood loss:                            Minimal. Estimated Blood Loss:     Estimated blood loss was minimal. Impression:                - The examined portion of the ileum was normal.                           - Diverticulosis in the cecum and in the left colon.                           - One 3 mm polyp in the cecum, removed with a cold                            biopsy forceps. Resected and retrieved.                           - Biopsies were taken with a cold forceps from the                            right colon, left colon and transverse colon for  evaluation of microscopic colitis. Recommendation:           - Patient has a contact number available for                            emergencies. The signs and symptoms of potential                            delayed complications were discussed with the                            patient. Return to normal activities tomorrow.                            Written discharge instructions were provided to the                            patient.                           - Resume previous diet.                           - Continue present medications.                           - Await pathology results.                           - Repeat colonoscopy is recommended for                            surveillance. The colonoscopy date will be                            determined after pathology results from today's                            exam become available for review. Remo Lipps P. Jakwon Gayton MD, MD 11/20/2017 4:53:22 PM This report has been signed electronically.

## 2017-11-21 ENCOUNTER — Telehealth: Payer: Self-pay

## 2017-11-21 NOTE — Telephone Encounter (Signed)
  Follow up Call-  Call back number 11/20/2017  Post procedure Call Back phone  # [phone] cell  Permission to leave phone message Yes  Some recent data might be hidden     Patient questions:  Do you have a fever, pain , or abdominal swelling? No. Pain Score  0 *  Have you tolerated food without any problems? Yes.    Have you been able to return to your normal activities? Yes.    Do you have any questions about your discharge instructions: Diet   No. Medications  No. Follow up visit  No.  Do you have questions or concerns about your Care? No.  Actions: * If pain score is 4 or above: No action needed, pain <4.

## 2017-11-23 NOTE — Telephone Encounter (Signed)
Copied from [place] [id number]. Topic: Quick Communication - Rx Refill/Question >> Nov 23, 2017  4:17 PM Patrice Paradise wrote: Has the patient contacted their pharmacy? Yes.  Cholecalciferol (VITAMIN D3) 3000 units TABS  (Agent: If no, request that the patient contact the pharmacy for the refill.)  Preferred Pharmacy (with phone number or street name):  [id number] [address], [place]. [place] [address] Phone: [phone] Fax: [phone]   Agent: Please be advised that RX refills may take up to 3 business days. We ask that you follow-up with your pharmacy.

## 2017-11-24 NOTE — Telephone Encounter (Signed)
Is this Rx'd for patient or OTC- not clear on med list.

## 2017-11-28 NOTE — Telephone Encounter (Signed)
Called patient and left message to return call. Vit D 3000 units can be purchased OTC.

## 2017-11-30 ENCOUNTER — Ambulatory Visit: Payer: Self-pay | Admitting: Neurology

## 2017-11-30 NOTE — Telephone Encounter (Signed)
Spoke with patient regarding being able to purchase Vitamin D OTC. Patient understood and had no further questions.

## 2017-12-19 DIAGNOSIS — I639 Cerebral infarction, unspecified: Secondary | ICD-10-CM

## 2017-12-19 HISTORY — DX: Cerebral infarction, unspecified: I63.9

## 2017-12-21 ENCOUNTER — Ambulatory Visit: Payer: PRIVATE HEALTH INSURANCE | Admitting: Family Medicine

## 2017-12-21 DIAGNOSIS — Z0289 Encounter for other administrative examinations: Secondary | ICD-10-CM

## 2018-01-25 ENCOUNTER — Ambulatory Visit: Payer: Self-pay | Admitting: Neurology

## 2018-02-02 ENCOUNTER — Ambulatory Visit: Payer: PRIVATE HEALTH INSURANCE | Admitting: Neurology

## 2018-02-02 ENCOUNTER — Encounter: Payer: Self-pay | Admitting: Neurology

## 2018-02-02 VITALS — BP 107/61 | HR 100 | Ht 62.5 in | Wt 131.0 lb

## 2018-02-02 DIAGNOSIS — G8190 Hemiplegia, unspecified affecting unspecified side: Secondary | ICD-10-CM | POA: Diagnosis not present

## 2018-02-02 DIAGNOSIS — G43101 Migraine with aura, not intractable, with status migrainosus: Secondary | ICD-10-CM

## 2018-02-02 DIAGNOSIS — G43409 Hemiplegic migraine, not intractable, without status migrainosus: Secondary | ICD-10-CM | POA: Diagnosis not present

## 2018-02-02 DIAGNOSIS — R569 Unspecified convulsions: Secondary | ICD-10-CM

## 2018-02-02 MED ORDER — SUMATRIPTAN SUCCINATE 100 MG PO TABS: 100.0000 mg | ORAL_TABLET | Freq: Once | ORAL | 12 refills | Status: DC | PRN

## 2018-02-02 MED ORDER — VERAPAMIL HCL ER 120 MG PO TBCR: 120.0000 mg | EXTENDED_RELEASE_TABLET | Freq: Every day | ORAL | 3 refills | Status: DC

## 2018-02-02 NOTE — Progress Notes (Signed)
[facility]    Provider:  Dr Jaynee Eagles Referring Provider: Billie Ruddy, MD Primary Care Physician:  Billie Ruddy, MD  CC:  Hemiplegic migraine  HPI:  Whitney Kent is a 47 y.o. 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 She has had symptoms since 1998. She has a cousin with similar migraines. Her daughters has migraines but not hemiplegic. The migraines start with aura, zig zag lines in a half circle, significant osmophobia, also photophobia, sleep helps, tightness in the head all over like a helmet, aura starts 30-60 minutes before the head pain. She can have vision loss central scotoma. She feels generally weak, but she has had weakness on one side of the body that was reversible, difficulty talking with aphasia. No nausea or vomiting. The pain can last for days and it is positional worse with bending over. Severity lessened. Sleep helps. In the last month she had 7 migraines, average 3 days in length, moderately severe. Stress triggers. No other focal neurologic deficits, associated symptoms, inciting events or modifiable factors.  Reviewed notes, labs and imaging from outside physicians, which showed:   Reviewed referring physician notes.  Patient has hemiplegic migraines happening 1-2 times a week.  May be associated with screen time at the computer at work and increased stress.  Patient's brother is trying to avoid foreclosure.  She also has increased tension in the neck.  CT BRAIN SCAN WITHOUT CONTRAST  (personally reviewed images) 5 mm images through the brain without contrast enhancement demonstrate no intracerebral mass effect or hemorrhage. There is no fracture of the calvarium. There is a small fluid level at in the right maxillary sinus with bilateral ethmoid sinus mucosal thickening.      IMPRESSION  1. No acute intracranial abnormality.  2. Small fluid level in the right maxillary sinus with bilateral  ethmoid sinus mucosal thickening.   tsh normal  Review of Systems: Patient complains of symptoms per HPI as well as the following symptoms: Blurred vision, double vision, ringing in ears, spinning sensation, diarrhea, constipation, headache, insomnia, dizziness, depression, anxiety. Pertinent negatives and positives per HPI. All others negative.   Social History   Socioeconomic History  . Marital status: Divorced    Spouse name: Not on file  . Number of children: 2  . Years of education: 60  . Highest education level: Some college, no degree  Social Needs  . Financial resource strain: Not on file  . Food insecurity - worry: Not on file  . Food insecurity - inability: Not on file  . Transportation needs - medical: Not on file  . Transportation needs - non-medical: Not on file  Occupational History  . Not on file  Tobacco Use  . Smoking status: Never Smoker  . Smokeless tobacco: Never Used  Substance and Sexual Activity  . Alcohol use: Yes    Comment: ocas  . Drug use: No  . Sexual activity: Yes    Birth control/protection: Condom  Other Topics Concern  . Not on file  Social History Narrative   Lives at home with her daughter   Right handed   Drinks occasional caffeine    Family History  Problem Relation Age of Onset  . Uterine cancer Mother   . Alcohol abuse Mother   . Arthritis Mother   . Depression Mother   . Drug abuse Mother   . Heart disease Mother   . Mental illness Mother   . Stroke Mother   .  Kidney failure Father   . Alcohol abuse Father   . Diabetes Father   . Prostate cancer Father   . Colon cancer Father 1  . Kidney disease Father   . Depression Brother   . Arthritis Maternal Grandmother   . COPD Maternal Grandmother   . Heart attack Maternal Grandmother   . Hypertension Maternal Grandmother   . Hyperlipidemia Maternal Grandmother   . Arthritis Maternal Grandfather   . Heart attack Maternal Grandfather   . Hyperlipidemia Maternal  Grandfather   . Diabetes Paternal Grandfather   . Depression Daughter   . Asthma Daughter   . Depression Daughter   . Autism Daughter   . Esophageal cancer Neg Hx   . Pancreatic cancer Neg Hx   . Rectal cancer Neg Hx   . Stomach cancer Neg Hx     Past Medical History:  Diagnosis Date  . Allergy   . Anemia   . Anxiety   . Asthma    per pt no asthma sx since age 65  . Depression   . GERD (gastroesophageal reflux disease)   . Goiter   . Hemiplegic migraine    pt dx 2004.  Pt had 1 seizure after this dx.  Not sure if related to dx or not. maw  . Seizures ([place])    2004. only 1 seizure  . Syncope   . Umbilical hernia   . Vitamin D deficiency     Past Surgical History:  Procedure Laterality Date  . WISDOM TOOTH EXTRACTION     2009 x4 removed    Current Outpatient Medications  Medication Sig Dispense Refill  . AMBULATORY NON FORMULARY MEDICATION Medication Name:  Dr Holli Humbles Probiotics- Prebiotics, Probiotics, Postbiotics-1 capsule daily    . AMBULATORY NON FORMULARY MEDICATION Medication Name: IBgard - Take as directed 12 capsule 0  . Ascorbic Acid (VITAMIN C PO) Take 1 tablet by mouth daily.     . Cholecalciferol (VITAMIN D3) 3000 units TABS Take by mouth daily.    . citalopram (CELEXA) 20 MG tablet Take 1 tablet (20 mg total) by mouth daily. 90 tablet 0  . CYANOCOBALAMIN PO Take 1 tablet by mouth daily.     Marland Kitchen etonogestrel-ethinyl estradiol (NUVARING) 0.12-0.015 MG/24HR vaginal ring Place 1 each vaginally every 28 (twenty-eight) days. Insert vaginally and leave in place for 3 consecutive weeks, then remove for 1 week.    . Multiple Vitamin (MULTIVITAMIN) tablet Take 1 tablet by mouth daily.    Marland Kitchen omeprazole (PRILOSEC) 20 MG capsule Take 1 capsule (20 mg total) by mouth daily. 60 capsule 1  . Vitamin D, Ergocalciferol, (DRISDOL) 50000 units CAPS capsule Take 1 capsule (50,000 Units total) by mouth every 7 (seven) days. 8 capsule 0  . SUMAtriptan (IMITREX) 100 MG tablet Take 1  tablet (100 mg total) by mouth once as needed for up to 1 dose. May repeat in 2 hours if headache persists or recurs. 10 tablet 12  . verapamil (CALAN-SR) 120 MG CR tablet Take 1 tablet (120 mg total) by mouth at bedtime. 30 tablet 3   No current facility-administered medications for this visit.     Allergies as of 02/02/2018  . (No Known Allergies)    Vitals: BP 107/61 (BP Location: Left Arm, Patient Position: Sitting)   Pulse 100   Ht 5' 2.5" (1.588 m)   Wt 131 lb (59.4 kg)   BMI 23.58 kg/m  Last Weight:  Wt Readings from Last 1 Encounters:  02/02/18 131 lb (59.4  kg)   Last Height:   Ht Readings from Last 1 Encounters:  02/02/18 5' 2.5" (1.588 m)    Physical exam: Exam: Gen: NAD, conversant, well nourised, obese, well groomed                     CV: RRR, no MRG. No Carotid Bruits. No peripheral edema, warm, nontender Eyes: Conjunctivae clear without exudates or hemorrhage  Neuro: Detailed Neurologic Exam  Speech:    Speech is normal; fluent and spontaneous with normal comprehension.  Cognition:    The patient is oriented to person, place, and time;     recent and remote memory intact;     language fluent;     normal attention, concentration,     fund of knowledge Cranial Nerves:    The pupils are equal, round, and reactive to light. The fundi are normal and spontaneous venous pulsations are present. Visual fields are full to finger confrontation. Extraocular movements are intact. Trigeminal sensation is intact and the muscles of mastication are normal. The face is symmetric. The palate elevates in the midline. Hearing intact. Voice is normal. Shoulder shrug is normal. The tongue has normal motion without fasciculations.   Coordination:    Normal finger to nose and heel to shin. Normal rapid alternating movements.   Gait:    Heel-toe and tandem gait are normal.   Motor Observation:    No asymmetry, no atrophy, and no involuntary movements noted. Tone:    Normal  muscle tone.    Posture:    Posture is normal. normal erect    Strength:Left LE prox weakness otherwise strength is V/V in the upper and lower limbs.      Sensation: intact to LT     Reflex Exam:  DTR's:    Deep tendon reflexes in the upper and lower extremities are normal bilaterally.   Toes:    The toes are downgoing bilaterally.   Clonus:    Clonus is absent.       Assessment/Plan:  Patient with hemipegic migraines   Multiple studies show that Triptans are safe medication for use in both basilar and hemiplegic migraine and are not associated with increased risk of stroke more than people with migraines without aura. Discussed risks however with patient and she acknowledges, will try a triptan  At onset try triptan. Sumatriptan. Please take one tablet at the onset of your headache. If it does not improve the symptoms please take one additional tablet in 2 hours. Do not take more then 2 tablets in 24hrs. Do not take use more then 2 to 3 times in a week.  Start Magnesium and verapamil for migraine prevention. Magnesium has evidence in helping with auras.  MRI brain w/wo contrast due to episodes of LOC and alterations of awareness and hemiplegia to evaluate for seizure   Orders Placed This Encounter  Procedures  . MR BRAIN W WO CONTRAST     Discussed: To prevent or relieve headaches, try the following: Cool Compress. Lie down and place a cool compress on your head.  Avoid headache triggers. If certain foods or odors seem to have triggered your migraines in the past, avoid them. A headache diary might help you identify triggers.  Include physical activity in your daily routine. Try a daily walk or other moderate aerobic exercise.  Manage stress. Find healthy ways to cope with the stressors, such as delegating tasks on your to-do list.  Practice relaxation techniques. Try deep breathing, yoga, massage  and visualization.  Eat regularly. Eating regularly scheduled meals and  maintaining a healthy diet might help prevent headaches. Also, drink plenty of fluids.  Follow a regular sleep schedule. Sleep deprivation might contribute to headaches Consider biofeedback. With this mind-body technique, you learn to control certain bodily functions - such as muscle tension, heart rate and blood pressure - to prevent headaches or reduce headache pain.    Proceed to emergency room if you experience new or worsening symptoms or symptoms do not resolve, if you have new neurologic symptoms or if headache is severe, or for any concerning symptom.   Provided education and documentation from American headache Society toolbox including articles on: chronic migraine medication overuse headache, chronic migraines, prevention of migraines, behavioral and other nonpharmacologic treatments for headache.   Sarina Ill, MD  [facility] [address] [place], [place] [address]  Phone [phone] Fax [phone]

## 2018-02-02 NOTE — Patient Instructions (Addendum)
Magnesium citrate 400mg  to 600mg  daily Start Verapamil daily MRI brain   At onset try triptan. Sumatriptan. Please take one tablet at the onset of your headache. If it does not improve the symptoms please take one additional tablet in 2 hours. Do not take more then 2 tablets in 24hrs. Do not take use more then 2 to 3 times in a week.  Sumatriptan tablets What is this medicine? SUMATRIPTAN (soo ma TRIP tan) is used to treat migraines with or without aura. An aura is a strange feeling or visual disturbance that warns you of an attack. It is not used to prevent migraines. This medicine may be used for other purposes; ask your health care provider or pharmacist if you have questions. COMMON BRAND NAME(S): Imitrex, Migraine Pack What should I tell my health care provider before I take this medicine? They need to know if you have any of these conditions: -circulation problems in fingers and toes -diabetes -heart disease -high blood pressure -high cholesterol -history of irregular heartbeat -history of stroke -kidney disease -liver disease -postmenopausal or surgical removal of uterus and ovaries -seizures -smoke tobacco -stomach or intestine problems -an unusual or allergic reaction to sumatriptan, other medicines, foods, dyes, or preservatives -pregnant or trying to get pregnant -breast-feeding How should I use this medicine? Take this medicine by mouth with a glass of water. Follow the directions on the prescription label. This medicine is taken at the first symptoms of a migraine. It is not for everyday use. If your migraine headache returns after one dose, you can take another dose as directed. You must leave at least 2 hours between doses, and do not take more than 100 mg as a single dose. Do not take more than 200 mg total in any 24 hour period. If there is no improvement at all after the first dose, do not take a second dose without talking to your doctor or health care professional.  Do not take your medicine more often than directed. Talk to your pediatrician regarding the use of this medicine in children. Special care may be needed. Overdosage: If you think you have taken too much of this medicine contact a poison control center or emergency room at once. NOTE: This medicine is only for you. Do not share this medicine with others. What if I miss a dose? This does not apply; this medicine is not for regular use. What may interact with this medicine? Do not take this medicine with any of the following medicines: -cocaine -ergot alkaloids like dihydroergotamine, ergonovine, ergotamine, methylergonovine -feverfew -MAOIs like Carbex, Eldepryl, Marplan, Nardil, and Parnate -other medicines for migraine headache like almotriptan, eletriptan, frovatriptan, naratriptan, rizatriptan, zolmitriptan -tryptophan This medicine may also interact with the following medications: -certain medicines for depression, anxiety, or psychotic disturbances This list may not describe all possible interactions. Give your health care provider a list of all the medicines, herbs, non-prescription drugs, or dietary supplements you use. Also tell them if you smoke, drink alcohol, or use illegal drugs. Some items may interact with your medicine. What should I watch for while using this medicine? Only take this medicine for a migraine headache. Take it if you get warning symptoms or at the start of a migraine attack. It is not for regular use to prevent migraine attacks. You may get drowsy or dizzy. Do not drive, use machinery, or do anything that needs mental alertness until you know how this medicine affects you. To reduce dizzy or fainting spells, do not sit or  stand up quickly, especially if you are an older patient. Alcohol can increase drowsiness, dizziness and flushing. Avoid alcoholic drinks. Smoking cigarettes may increase the risk of heart-related side effects from using this medicine. If you take  migraine medicines for 10 or more days a month, your migraines may get worse. Keep a diary of headache days and medicine use. Contact your healthcare professional if your migraine attacks occur more frequently. What side effects may I notice from receiving this medicine? Side effects that you should report to your doctor or health care professional as soon as possible: -allergic reactions like skin rash, itching or hives, swelling of the face, lips, or tongue -bloody or watery diarrhea -hallucination, loss of contact with reality -pain, tingling, numbness in the face, hands, or feet -seizures -signs and symptoms of a blood clot such as breathing problems; changes in vision; chest pain; severe, sudden headache; pain, swelling, warmth in the leg; trouble speaking; sudden numbness or weakness of the face, arm, or leg -signs and symptoms of a dangerous change in heartbeat or heart rhythm like chest pain; dizziness; fast or irregular heartbeat; palpitations, feeling faint or lightheaded; falls; breathing problems -signs and symptoms of a stroke like changes in vision; confusion; trouble speaking or understanding; severe headaches; sudden numbness or weakness of the face, arm, or leg; trouble walking; dizziness; loss of balance or coordination -stomach pain Side effects that usually do not require medical attention (report to your doctor or health care professional if they continue or are bothersome): -changes in taste -facial flushing -headache -muscle cramps -muscle pain -nausea, vomiting -weak or tired This list may not describe all possible side effects. Call your doctor for medical advice about side effects. You may report side effects to FDA at 1-800-FDA-1088. Where should I keep my medicine? Keep out of the reach of children. Store at room temperature between 2 and 30 degrees C (36 and 86 degrees F). Throw away any unused medicine after the expiration date. NOTE: This sheet is a summary. It  may not cover all possible information. If you have questions about this medicine, talk to your doctor, pharmacist, or health care provider.  2018 Elsevier/Gold Standard (2016-01-07 12:38:23)   Verapamil sustained-release capsules What is this medicine? VERAPAMIL (ver AP a mil) is a calcium-channel blocker. It affects the amount of calcium found in your heart and muscle cells. This relaxes your blood vessels, which can reduce the amount of work the heart has to do. This medicine is used to lower high blood pressure. This medicine may be used for other purposes; ask your health care provider or pharmacist if you have questions. COMMON BRAND NAME(S): Verelan, Verelan PM What should I tell my health care provider before I take this medicine? They need to know if you have any of these conditions: -heart or blood vessel disease -heart rhythm disturbances such as sick sinus syndrome, ventricular arrhythmias, Wolff-Parkinson-White syndrome, or Lown-Ganong-Levine syndrome -liver or kidney disease -low blood pressure -an unusual or allergic reaction to verapamil, other medicines, foods, dyes, or preservatives -pregnant or trying to get pregnant -breast-feeding How should I use this medicine? Take this medicine by mouth with a glass of water. Follow the directions on the prescription label. The capsules may be opened and the medicine poured into a small amount of applesauce. Stir well and swallow without chewing. Take this medicine with food to reduce stomach upset. Take your doses at regular intervals. Do not take your medicine more often then directed. Do not stop taking except  on the advice of your doctor or health care professional. Talk to your pediatrician regarding the use of this medicine in children. Special care may be needed. Overdosage: If you think you have taken too much of this medicine contact a poison control center or emergency room at once. NOTE: This medicine is only for you. Do not  share this medicine with others. What if I miss a dose? If you miss a dose, take it as soon as you can. If it is almost time for your next dose, take only that dose. Do not take double or extra doses. What may interact with this medicine? Do not take this medicine with any of the following: -cisapride -disopyramide -dofetilide -grapefruit juice -hawthorn -pimozide -red yeast rice This medicine may also interact with the following medications: -barbiturates such as phenobarbital -cimetidine -cyclosporine -lithium -local anesthetics or general anesthetics -medicines for heart rhythm problems like amiodarone, digoxin, flecainide, procainamide, quinidine -medicines for high blood pressure or heart problems -medicines for seizures like carbamazepine and phenytoin -rifampin, rifabutin or rifapentine -theophylline or aminophylline This list may not describe all possible interactions. Give your health care provider a list of all the medicines, herbs, non-prescription drugs, or dietary supplements you use. Also tell them if you smoke, drink alcohol, or use illegal drugs. Some items may interact with your medicine. What should I watch for while using this medicine? Check your blood pressure and pulse rate regularly. Ask your doctor or health care professional what your blood pressure and pulse rate should be and when you should contact him or her. Do not suddenly stop taking this medicine. Ask your doctor or health care professional how to gradually reduce the dose. You may get drowsy or dizzy. Do not drive, use machinery, or do anything that needs mental alertness until you know how this medicine affects you. Do not stand or sit up quickly, especially if you are an older patient. This reduces the risk of dizzy or fainting spells. Alcohol may interfere with the effect of this medicine. Avoid alcoholic drinks. What side effects may I notice from receiving this medicine? Side effects that you should  report to your doctor or health care professional as soon as possible: -difficulty breathing -dizziness or light headedness -fainting -fast heartbeat, palpitations, irregular heartbeat, or chest pain -skin rash -slow heartbeat -swelling of the legs or ankles Side effects that usually do not require medical attention (report to your doctor or health care professional if they continue or are bothersome): -constipation -facial flushing -headache -nausea, vomiting -sexual dysfunction -weakness or tiredness This list may not describe all possible side effects. Call your doctor for medical advice about side effects. You may report side effects to FDA at 1-800-FDA-1088. Where should I keep my medicine? Keep out of the reach of children. Store at room temperature between 15 and 25 degrees C (59 and 77 degrees F). Protect from light and moisture. Keep container tightly closed. NOTE: This sheet is a summary. It may not cover all possible information. If you have questions about this medicine, talk to your doctor, pharmacist, or health care provider.  2018 Elsevier/Gold Standard (2008-09-01 17:47:58)

## 2018-02-05 DIAGNOSIS — G43409 Hemiplegic migraine, not intractable, without status migrainosus: Secondary | ICD-10-CM | POA: Insufficient documentation

## 2018-02-15 ENCOUNTER — Encounter: Payer: Self-pay | Admitting: Family Medicine

## 2018-02-16 ENCOUNTER — Other Ambulatory Visit: Payer: Self-pay | Admitting: Family Medicine

## 2018-02-16 DIAGNOSIS — E049 Nontoxic goiter, unspecified: Secondary | ICD-10-CM

## 2018-02-16 NOTE — Progress Notes (Signed)
re

## 2018-02-22 ENCOUNTER — Ambulatory Visit
Admission: RE | Admit: 2018-02-22 | Discharge: 2018-02-22 | Disposition: A | Discharge status: Home / Self Care | Payer: PRIVATE HEALTH INSURANCE | Source: Ambulatory Visit | Attending: Neurology | Admitting: Neurology

## 2018-02-22 DIAGNOSIS — R569 Unspecified convulsions: Secondary | ICD-10-CM | POA: Diagnosis not present

## 2018-02-22 DIAGNOSIS — G8190 Hemiplegia, unspecified affecting unspecified side: Secondary | ICD-10-CM

## 2018-02-22 DIAGNOSIS — G43101 Migraine with aura, not intractable, with status migrainosus: Secondary | ICD-10-CM

## 2018-02-22 MED ORDER — GADOBENATE DIMEGLUMINE 529 MG/ML IV SOLN
12.0000 mL | Freq: Once | INTRAVENOUS | Status: AC | PRN
  Administered 2018-02-22: 12 mL via INTRAVENOUS

## 2018-02-28 ENCOUNTER — Telehealth: Payer: Self-pay | Admitting: *Deleted

## 2018-02-28 ENCOUNTER — Telehealth: Payer: Self-pay | Admitting: Gastroenterology

## 2018-02-28 NOTE — Telephone Encounter (Signed)
Called pt & scheduled her for next Thurs 3/21 @ 09:15 arrival time 08:45. She verbalized understanding and appreciation.

## 2018-02-28 NOTE — Telephone Encounter (Signed)
Called pt & LVM asking for call back.  

## 2018-02-28 NOTE — Telephone Encounter (Signed)
-----   Message from Melvenia Beam, MD sent at 02/28/2018 10:28 AM EDT ----- Whitney Kent, the brain obverall looked very good. However there is a small area of scarring in the brain. Unsure why it is there, could have been old head trauma or infection (even as a child). Can;t rule out stroke. Ask if she has ever had any head trauma, it is on the left in the parietal area. I would suggest she start a daily baby aspirin and I would like to see her back to show it to her and discuss further evaluation especially since it may be an old stroke. plese schedule her thanks
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
## 2018-02-28 NOTE — Telephone Encounter (Signed)
Can you help clarify why she wants to take an aspirin every day? Given her family history, aspirin may help reduce her risk for developing colon polyps / colon cancer and it is not a bad idea. It has the potential to make some of her intestinal symptoms worse, but it may not. The only way to tell how it effects her is to take it and monitor for symptoms.

## 2018-02-28 NOTE — Telephone Encounter (Signed)
Pt has called back and is asking for a returned call

## 2018-02-28 NOTE — Telephone Encounter (Signed)
Routed to Dr. Armbruster. 

## 2018-03-01 NOTE — Telephone Encounter (Signed)
Spoke to patient and her neurologist asked her to start on a low dose 81 mg aspirin daily. This is being prescribed as a precautionary measure based on recent MRI of brain. Patient aware to monitor GI symptoms but will start taking the aspirin.

## 2018-03-01 NOTE — Telephone Encounter (Signed)
Okay that's fine.    Thanks.

## 2018-03-08 ENCOUNTER — Ambulatory Visit: Payer: Self-pay | Admitting: Neurology

## 2018-03-08 ENCOUNTER — Telehealth: Payer: Self-pay | Admitting: *Deleted

## 2018-03-08 NOTE — Telephone Encounter (Signed)
Pt no showed appt on 03/08/2018 @ 09:15, arrived late.

## 2018-03-12 ENCOUNTER — Encounter: Payer: Self-pay | Admitting: Neurology

## 2018-03-12 ENCOUNTER — Encounter: Payer: Self-pay | Admitting: Family Medicine

## 2018-03-13 ENCOUNTER — Encounter: Payer: Self-pay | Admitting: Neurology

## 2018-03-27 ENCOUNTER — Ambulatory Visit: Payer: PRIVATE HEALTH INSURANCE | Admitting: Neurology

## 2018-03-27 ENCOUNTER — Encounter: Payer: Self-pay | Admitting: Neurology

## 2018-03-27 VITALS — BP 101/76 | HR 97 | Ht 62.5 in | Wt 130.0 lb

## 2018-03-27 DIAGNOSIS — G43101 Migraine with aura, not intractable, with status migrainosus: Secondary | ICD-10-CM | POA: Diagnosis not present

## 2018-03-27 MED ORDER — BUTALBITAL-APAP-CAFFEINE 50-325-40 MG PO TABS: 1.0000 | ORAL_TABLET | Freq: Four times a day (QID) | ORAL | 3 refills | Status: DC | PRN

## 2018-03-27 MED ORDER — KETOROLAC TROMETHAMINE 60 MG/2ML IM SOLN
30.0000 mg | Freq: Once | INTRAMUSCULAR | Status: AC
  Administered 2018-03-27: 30 mg via INTRAMUSCULAR

## 2018-03-27 NOTE — Patient Instructions (Signed)
Continue Verapamil ASA 81mg  daily Magnesium citrate 400mg  to 600mg  daily Fioricet at onset of migraine, stop sumatriptan  Acetaminophen; Butalbital; Caffeine tablets or capsules What is this medicine? ACETAMINOPHEN; BUTALBITAL; CAFFEINE (a set a MEE noe fen; byoo TAL bi tal; KAF een) is a pain reliever. It is used to treat tension headaches. This medicine may be used for other purposes; ask your health care provider or pharmacist if you have questions. COMMON BRAND NAME(S): Alagesic, Americet, Anolor-300, Arcet, BAC, CAPACET, Dolgic Plus, Esgic, Esgic Plus, Ezol, Fioricet, [employer], Medigesic, [place], Pacaps, Phrenilin Forte, Repan, [place], Triad, Zebutal What should I tell my health care provider before I take this medicine? They need to know if you have any of these conditions: -drug abuse or addiction -heart or circulation problems -if you often drink alcohol -kidney disease or problems going to the bathroom -liver disease -lung disease, asthma, or breathing problems -porphyria -an unusual or allergic reaction to acetaminophen, butalbital or other barbiturates, caffeine, other medicines, foods, dyes, or preservatives -pregnant or trying to get pregnant -breast-feeding How should I use this medicine? Take this medicine by mouth with a full glass of water. Follow the directions on the prescription label. If the medicine upsets your stomach, take the medicine with food or milk. Do not take more than you are told to take. Talk to your pediatrician regarding the use of this medicine in children. Special care may be needed. Overdosage: If you think you have taken too much of this medicine contact a poison control center or emergency room at once. NOTE: This medicine is only for you. Do not share this medicine with others. What if I miss a dose? If you miss a dose, take it as soon as you can. If it is almost time for your next dose, take only that dose. Do not take double or extra  doses. What may interact with this medicine? -alcohol or medicines that contain alcohol -antidepressants, especially MAOIs like isocarboxazid, phenelzine, tranylcypromine, and selegiline -antihistamines -benzodiazepines -carbamazepine -isoniazid -medicines for pain like pentazocine, buprenorphine, butorphanol, nalbuphine, tramadol, and propoxyphene -muscle relaxants -naltrexone -phenobarbital, phenytoin, and fosphenytoin -phenothiazines like perphenazine, thioridazine, chlorpromazine, mesoridazine, fluphenazine, prochlorperazine, promazine, and trifluoperazine -voriconazole This list may not describe all possible interactions. Give your health care provider a list of all the medicines, herbs, non-prescription drugs, or dietary supplements you use. Also tell them if you smoke, drink alcohol, or use illegal drugs. Some items may interact with your medicine. What should I watch for while using this medicine? Tell your doctor or health care professional if your pain does not go away, if it gets worse, or if you have new or a different type of pain. You may develop tolerance to the medicine. Tolerance means that you will need a higher dose of the medicine for pain relief. Tolerance is normal and is expected if you take the medicine for a long time. Do not suddenly stop taking your medicine because you may develop a severe reaction. Your body becomes used to the medicine. This does NOT mean you are addicted. Addiction is a behavior related to getting and using a drug for a non-medical reason. If you have pain, you have a medical reason to take pain medicine. Your doctor will tell you how much medicine to take. If your doctor wants you to stop the medicine, the dose will be slowly lowered over time to avoid any side effects. You may get drowsy or dizzy when you first start taking the medicine or change doses. Do  not drive, use machinery, or do anything that may be dangerous until you know how the medicine  affects you. Stand or sit up slowly. Do not take other medicines that contain acetaminophen with this medicine. Always read labels carefully. If you have questions, ask your doctor or pharmacist. If you take too much acetaminophen get medical help right away. Too much acetaminophen can be very dangerous and cause liver damage. Even if you do not have symptoms, it is important to get help right away. What side effects may I notice from receiving this medicine? Side effects that you should report to your doctor or health care professional as soon as possible: -allergic reactions like skin rash, itching or hives, swelling of the face, lips, or tongue -breathing problems -confusion -feeling faint or lightheaded, falls -redness, blistering, peeling or loosening of the skin, including inside the mouth -seizure -stomach pain -yellowing of the eyes or skin Side effects that usually do not require medical attention (report to your doctor or health care professional if they continue or are bothersome): -constipation -nausea, vomiting This list may not describe all possible side effects. Call your doctor for medical advice about side effects. You may report side effects to FDA at 1-800-FDA-1088. Where should I keep my medicine? Keep out of the reach of children. This medicine can be abused. Keep your medicine in a safe place to protect it from theft. Do not share this medicine with anyone. Selling or giving away this medicine is dangerous and against the law. This medicine may cause accidental overdose and death if it taken by other adults, children, or pets. Mix any unused medicine with a substance like cat litter or coffee grounds. Then throw the medicine away in a sealed container like a sealed bag or a coffee can with a lid. Do not use the medicine after the expiration date. Store at room temperature between 15 and 30 degrees C (59 and 86 degrees F). NOTE: This sheet is a summary. It may not cover all  possible information. If you have questions about this medicine, talk to your doctor, pharmacist, or health care provider.  2018 Elsevier/Gold Standard (2014-01-31 15:00:25)

## 2018-03-27 NOTE — Progress Notes (Signed)
Pt given Toradol 30 mg IM injection. See MAR. Pt tolerated well. No bleeding, bandaid applied.

## 2018-03-27 NOTE — Progress Notes (Signed)
[facility]    Provider:  Dr Jaynee Eagles Referring Provider: Billie Ruddy, MD Primary Care Physician:  Billie Ruddy, MD  CC:  Hemiplegic migraine  Interval history 03/27/2018: Patient is here today to follow-up on MRI of the brain and hemiplegic migraines.  MRI of the brain was possibly slightly abnormal, reviewed images with patient and answered all questions.  There was a small area of encephalomalacia which is of unknown etiology, could be remote trauma, could be from congenital infection, injury but could also be from an infarct which I believe is less likely given her age and insignificant vascular risk factors however migraines with aura do have an increased risk of stroke.   MRI brain reviewed images with patient and agree:   IMPRESSION:  Slightly abnormal MRI scan of the brain showing tiny area of cystic encephalomalacia and gliosis in the left parietal periventricular region which may represent remote trauma ,injury or infarct. No enhancing lesions are noted.   HPI:  Whitney Kent is a 47 y.o. 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 She has had symptoms since 1998. She has a cousin with similar migraines. Her daughters has migraines but not hemiplegic. The migraines start with aura, zig zag lines in a half circle, significant osmophobia, also photophobia, sleep helps, tightness in the head all over like a helmet, aura starts 30-60 minutes before the head pain. She can have vision loss central scotoma. She feels generally weak, but she has had weakness on one side of the body that was reversible, difficulty talking with aphasia. No nausea or vomiting. The pain can last for days and it is positional worse with bending over. Severity lessened. Sleep helps. In the last month she had 7 migraines, average 3 days in length, moderately severe. Stress triggers. No other focal neurologic deficits, associated symptoms,  inciting events or modifiable factors.  Reviewed notes, labs and imaging from outside physicians, which showed:   Reviewed referring physician notes.  Patient has hemiplegic migraines happening 1-2 times a week.  May be associated with screen time at the computer at work and increased stress.  Patient's brother is trying to avoid foreclosure.  She also has increased tension in the neck.  CT BRAIN SCAN WITHOUT CONTRAST  (personally reviewed images) 5 mm images through the brain without contrast enhancement demonstrate no intracerebral mass effect or hemorrhage. There is no fracture of the calvarium. There is a small fluid level at in the right maxillary sinus with bilateral ethmoid sinus mucosal thickening.      IMPRESSION  1. No acute intracranial abnormality.  2. Small fluid level in the right maxillary sinus with bilateral ethmoid sinus mucosal thickening.   tsh normal  Review of Systems: Patient complains of symptoms per HPI as well as the following symptoms: Blurred vision, double vision, ringing in ears, spinning sensation, diarrhea, constipation, headache, insomnia, dizziness, depression, anxiety. Pertinent negatives and positives per HPI. All others negative.   Social History   Socioeconomic History  . Marital status: Divorced    Spouse name: Not on file  . Number of children: 2  . Years of education: 18  . Highest education level: Some college, no degree  Occupational History  . Not on file  Social Needs  . Financial resource strain: Not on file  . Food insecurity:    Worry: Not on file    Inability: Not on file  . Transportation needs:    Medical: Not on  file    Non-medical: Not on file  Tobacco Use  . Smoking status: Never Smoker  . Smokeless tobacco: Never Used  Substance and Sexual Activity  . Alcohol use: Yes    Comment: ocas  . Drug use: No  . Sexual activity: Yes    Partners: Male    Birth control/protection: Condom, Other-see comments    Comment:  Nuvaring   Lifestyle  . Physical activity:    Days per week: Not on file    Minutes per session: Not on file  . Stress: Not on file  Relationships  . Social connections:    Talks on phone: Not on file    Gets together: Not on file    Attends religious service: Not on file    Active member of club or organization: Not on file    Attends meetings of clubs or organizations: Not on file    Relationship status: Not on file  . Intimate partner violence:    Fear of current or ex partner: Not on file    Emotionally abused: Not on file    Physically abused: Not on file    Forced sexual activity: Not on file  Other Topics Concern  . Not on file  Social History Narrative   Lives at home with her daughter   Right handed   Drinks occasional caffeine    Family History  Problem Relation Age of Onset  . Uterine cancer Mother   . Alcohol abuse Mother   . Arthritis Mother   . Depression Mother   . Drug abuse Mother   . Heart disease Mother   . Mental illness Mother   . Stroke Mother   . Kidney failure Father   . Alcohol abuse Father   . Diabetes Father   . Prostate cancer Father   . Colon cancer Father 30  . Kidney disease Father   . Depression Brother   . Arthritis Maternal Grandmother   . COPD Maternal Grandmother   . Heart attack Maternal Grandmother   . Hypertension Maternal Grandmother   . Hyperlipidemia Maternal Grandmother   . Arthritis Maternal Grandfather   . Heart attack Maternal Grandfather   . Hyperlipidemia Maternal Grandfather   . Diabetes Paternal Grandfather   . Depression Daughter   . Asthma Daughter   . Depression Daughter   . Autism Daughter   . Esophageal cancer Neg Hx   . Pancreatic cancer Neg Hx   . Rectal cancer Neg Hx   . Stomach cancer Neg Hx     Past Medical History:  Diagnosis Date  . Allergy   . Anemia   . Anxiety   . Asthma    per pt no asthma sx since age 14  . Depression   . GERD (gastroesophageal reflux disease)   . Goiter   .  Hemiplegic migraine    pt dx 2004.  Pt had 1 seizure after this dx.  Not sure if related to dx or not. maw  . Seizures ([place])    2004. only 1 seizure  . Syncope   . Umbilical hernia   . Vitamin D deficiency     Past Surgical History:  Procedure Laterality Date  . WISDOM TOOTH EXTRACTION     2009 x4 removed    Current Outpatient Medications  Medication Sig Dispense Refill  . AMBULATORY NON FORMULARY MEDICATION Medication Name:  Dr Holli Humbles Probiotics- Prebiotics, Probiotics, Postbiotics-1 capsule daily    . AMBULATORY NON FORMULARY MEDICATION Medication Name: IBgard -  Take as directed 12 capsule 0  . Ascorbic Acid (VITAMIN C PO) Take 1 tablet by mouth daily.     Marland Kitchen aspirin EC 81 MG tablet Take 81 mg by mouth daily.    . Cholecalciferol (VITAMIN D3) 3000 units TABS Take by mouth daily.    . citalopram (CELEXA) 20 MG tablet Take 1 tablet (20 mg total) by mouth daily. 90 tablet 0  . CYANOCOBALAMIN PO Take 1 tablet by mouth daily.     Marland Kitchen etonogestrel-ethinyl estradiol (NUVARING) 0.12-0.015 MG/24HR vaginal ring Place 1 each vaginally every 28 (twenty-eight) days. Insert vaginally and leave in place for 3 consecutive weeks, then remove for 1 week.    . Multiple Vitamin (MULTIVITAMIN) tablet Take 1 tablet by mouth daily.    Marland Kitchen omeprazole (PRILOSEC) 20 MG capsule Take 1 capsule (20 mg total) by mouth daily. 60 capsule 1  . verapamil (CALAN-SR) 120 MG CR tablet Take 1 tablet (120 mg total) by mouth at bedtime. 30 tablet 3  . Vitamin D, Ergocalciferol, (DRISDOL) 50000 units CAPS capsule Take 1 capsule (50,000 Units total) by mouth every 7 (seven) days. 8 capsule 0  . butalbital-acetaminophen-caffeine (FIORICET, ESGIC) 50-325-40 MG tablet Take 1 tablet by mouth every 6 (six) hours as needed for headache. 20 tablet 3   No current facility-administered medications for this visit.     Allergies as of 03/27/2018  . (No Known Allergies)    Vitals: BP 101/76 (BP Location: Left Arm, Patient Position:  Sitting)   Pulse 97   Ht 5' 2.5" (1.588 m)   Wt 130 lb (59 kg)   BMI 23.40 kg/m  Last Weight:  Wt Readings from Last 1 Encounters:  03/27/18 130 lb (59 kg)   Last Height:   Ht Readings from Last 1 Encounters:  03/27/18 5' 2.5" (1.588 m)    Physical exam: Exam: Gen: NAD, conversant, well nourised, thin, well groomed                     CV: RRR, no MRG. No Carotid Bruits. No peripheral edema, warm, nontender Eyes: Conjunctivae clear without exudates or hemorrhage  Neuro: Detailed Neurologic Exam  Speech:    Speech is normal; fluent and spontaneous with normal comprehension.  Cognition:    The patient is oriented to person, place, and time;     recent and remote memory intact;     language fluent;     normal attention, concentration,     fund of knowledge Cranial Nerves:    The pupils are equal, round, and reactive to light. The fundi are normal and spontaneous venous pulsations are present. Visual fields are full to finger confrontation. Extraocular movements are intact. Trigeminal sensation is intact and the muscles of mastication are normal. The face is symmetric. The palate elevates in the midline. Hearing intact. Voice is normal. Shoulder shrug is normal. The tongue has normal motion without fasciculations.   Coordination:    Normal finger to nose and heel to shin. Normal rapid alternating movements.   Gait:    Heel-toe and tandem gait are normal.   Motor Observation:    No asymmetry, no atrophy, and no involuntary movements noted. Tone:    Normal muscle tone.    Posture:    Posture is normal. normal erect    Strength:Left LE prox weakness otherwise strength is V/V in the upper and lower limbs.      Sensation: intact to LT     Reflex Exam:  DTR's:  Deep tendon reflexes in the upper and lower extremities are normal bilaterally.   Toes:    The toes are downgoing bilaterally.   Clonus:    Clonus is absent.       Assessment/Plan:  Patient with  hemipegic migraines  -  MRI of the brain showed a small area of encephalomalacia which is of unknown etiology, could be remote trauma, could be from congenital infection, injury but could also be from an infarct which I believe is less likely given her age and insignificant vascular risk factors however migraines with aura do have an increased risk of stroke. -If there is even a remote possibility this could be a stroke, triptan's are contraindicated.  We will start a different acute management medication, discussed we will try Fioricet as long as there is no overuse.  Continue Magnesium and verapamil for migraine prevention. Magnesium has evidence in helping with auras.  I had a long d/w patient about her possible stroke, risk for recurrent stroke/TIAs, personally independently reviewed imaging studies and stroke evaluation results and answered questions.Continue ASA for secondary stroke prevention and maintain strict control of hypertension with blood pressure goal below 130/90, diabetes with hemoglobin A1c goal below 6.5% and lipids with LDL cholesterol goal below 70 mg/dL. I also advised the patient to eat a healthy diet with plenty of whole grains, cereals, fruits and vegetables, exercise regularly and maintain ideal body weight .   There is increased risk for stroke in women with migraine with aura and a  Contraindication for the combined contraceptive pill for use by women who have migraine with aura, which is in line with World Health Organisation recommendations. The risk for women with migraine without aura is lower and other risk factors like smoking are far more likely to increase stroke risk than migraine. There is a recommendation for no smoking and for the use of low estrogen or progestogen only pills particularly for women with migraine with aura. It is important however that women with migraine who are taking the pill do not decide to suddenly stop taking it without discussing this with  their doctor. Please discuss with her OB/GYN.  Discussed: To prevent or relieve headaches, try the following: Cool Compress. Lie down and place a cool compress on your head.  Avoid headache triggers. If certain foods or odors seem to have triggered your migraines in the past, avoid them. A headache diary might help you identify triggers.  Include physical activity in your daily routine. Try a daily walk or other moderate aerobic exercise.  Manage stress. Find healthy ways to cope with the stressors, such as delegating tasks on your to-do list.  Practice relaxation techniques. Try deep breathing, yoga, massage and visualization.  Eat regularly. Eating regularly scheduled meals and maintaining a healthy diet might help prevent headaches. Also, drink plenty of fluids.  Follow a regular sleep schedule. Sleep deprivation might contribute to headaches Consider biofeedback. With this mind-body technique, you learn to control certain bodily functions - such as muscle tension, heart rate and blood pressure - to prevent headaches or reduce headache pain.    Proceed to emergency room if you experience new or worsening symptoms or symptoms do not resolve, if you have new neurologic symptoms or if headache is severe, or for any concerning symptom.   Provided education and documentation from American headache Society toolbox including articles on: chronic migraine medication overuse headache, chronic migraines, prevention of migraines, behavioral and other nonpharmacologic treatments for headache.   Sarina Ill, MD  [facility] [address] [place], [place] [address]  Phone [phone] Fax [phone]  A total of 40  minutes was spent face-to-face with this patient. Over half this time was spent on counseling patient on the hemiplegic migraine, abnormal mri  diagnosis and different diagnostic and therapeutic options, compliance, or risk factor reduction and  education.

## 2018-04-14 ENCOUNTER — Encounter: Payer: Self-pay | Admitting: Neurology

## 2018-04-16 ENCOUNTER — Encounter: Payer: Self-pay | Admitting: Family Medicine

## 2018-04-16 ENCOUNTER — Ambulatory Visit (INDEPENDENT_AMBULATORY_CARE_PROVIDER_SITE_OTHER): Payer: PRIVATE HEALTH INSURANCE | Admitting: Family Medicine

## 2018-04-16 VITALS — BP 100/60 | Temp 98.7°F | Ht 63.0 in | Wt 134.6 lb

## 2018-04-16 DIAGNOSIS — Z0000 Encounter for general adult medical examination without abnormal findings: Secondary | ICD-10-CM | POA: Diagnosis not present

## 2018-04-16 DIAGNOSIS — Z131 Encounter for screening for diabetes mellitus: Secondary | ICD-10-CM

## 2018-04-16 DIAGNOSIS — J302 Other seasonal allergic rhinitis: Secondary | ICD-10-CM | POA: Diagnosis not present

## 2018-04-16 MED ORDER — CITALOPRAM HYDROBROMIDE 20 MG PO TABS
20.0000 mg | ORAL_TABLET | Freq: Every day | ORAL | 1 refills | Status: DC
Start: 2018-04-16 — End: 2019-09-11

## 2018-04-16 MED ORDER — LORATADINE 10 MG PO TABS: 10.0000 mg | ORAL_TABLET | Freq: Every day | ORAL | 11 refills | Status: DC

## 2018-04-16 NOTE — Progress Notes (Signed)
Subjective:     Whitney Kent is a 47 y.o. female and is here for a comprehensive physical exam. The patient reports problems - continue HAs.   Since last OFV pt has seen Neruology for her hemiplegic migraines with aura.  Pt states an MRI of her brain revealed small area of cystic encephalomalacia and gliosis of unknown etiology.  Pt started on aspirin 81 mg daily, Fioricet as needed, verapamil 120 mg daily.  Patient endorses more frequent migraines over the last few months.  She cannot pinpoint a trigger for this.  Patient states her migraines have started to change in nature.  In the past patient would have or followed by pain, but has been having aura without the pain.   Pt endorses minimal L LE weakness.  Pt also notes slightly decreased coordination and concentration. 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  Pt has been using nuva ring for the last few months but is unsure if she likes it.  Pt states she noticed an increased vaginal d/c since using it.  Pt taking celexa 20 mg nightly as it makes her feel sleepy.  Pt endorses improved sleep and her mood is good.  Pt is wondering what she can take for allergies.  Pt has also seen GI for h/o ongoing abdominal concerns.  Pt had a colonoscopy.  Pt has a f/u appt next wk for goiter.   Social History   Socioeconomic History  . Marital status: Divorced    Spouse name: Not on file  . Number of children: 2  . Years of education: 18  . Highest education level: Some college, no degree  Occupational History  . Not on file  Social Needs  . Financial resource strain: Not on file  . Food insecurity:    Worry: Not on file    Inability: Not on file  . Transportation needs:    Medical: Not on file    Non-medical: Not on file  Tobacco Use  . Smoking status: Never  Smoker  . Smokeless tobacco: Never Used  Substance and Sexual Activity  . Alcohol use: Yes    Comment: ocas  . Drug use: No  . Sexual activity: Yes    Partners: Male    Birth control/protection: Condom, Other-see comments    Comment: Nuvaring   Lifestyle  . Physical activity:    Days per week: Not on file    Minutes per session: Not on file  . Stress: Not on file  Relationships  . Social connections:    Talks on phone: Not on file    Gets together: Not on file    Attends religious service: Not on file    Active member of club or organization: Not on file    Attends meetings of clubs or organizations: Not on file    Relationship status: Not on file  . Intimate partner violence:    Fear of current or ex partner: Not on file    Emotionally abused: Not on file    Physically abused: Not on file    Forced sexual activity: Not on file  Other Topics Concern  . Not on file  Social History Narrative   Lives at home with her daughter   Right handed   Drinks occasional caffeine   Health Maintenance  Topic Date Due  . TETANUS/TDAP  03/05/1992  . INFLUENZA VACCINE  07/19/2018  . PAP SMEAR  09/26/2020  . HIV Screening  Completed    The following portions of the patient's history were reviewed and updated as appropriate: allergies, current medications, past family history, past medical history, past social history, past surgical history and problem list.  Review of Systems A comprehensive review of systems was negative.    Except for hemiplegic migraines, L sided weakness, decreased concentration.  Objective:    BP 100/60 (BP Location: Left Arm, Patient Position: Sitting, Cuff Size: Normal)   Temp 98.7 F (37.1 C) (Oral)   Ht 5\' 3"  (1.6 m)   Wt 134 lb 9.6 oz (61.1 kg)   LMP 04/16/2018 (Exact Date)   BMI 23.84 kg/m  General appearance: alert, cooperative and no distress Head: Normocephalic, without obvious abnormality, atraumatic Eyes: conjunctivae/corneas clear. PERRL,  EOM's intact. Fundi benign. Ears: normal TM's and external ear canals both ears Nose: Nares normal. Septum midline. Mucosa normal. No drainage or sinus tenderness. Throat: lips, mucosa, and tongue normal; teeth and gums normal Neck: no adenopathy, no carotid bruit, no JVD, supple, symmetrical, trachea midline and thyroid not enlarged, symmetric, no tenderness/mass/nodules Lungs: clear to auscultation bilaterally Heart: regular rate and rhythm, S1, S2 normal, no murmur, click, rub or gallop Abdomen: soft, non-tender; bowel sounds normal; no masses,  no organomegaly Extremities: extremities normal, atraumatic, no cyanosis or edema Pulses: 2+ and symmetric Skin: Skin color, texture, turgor normal. No rashes or lesions Neurologic: Alert and oriented X 3, normal strength and tone. Normal symmetric reflexes. Normal coordination and gait    Assessment:    Healthy female exam with hemiplegic migraines, decreased concentration, goiter, and h/o depression.     Plan:      Anticipatory guidance given including wearing seatbelts, smoke detectors at home, increasing physical activity, increasing p.o. intake of water. -Pap up-to-date -Labs up-to-date.  Will place order for CMP and Hgb A1c though pt had these labs in Nov.  If needed for insurance pt will return to have labs redrawn. -Follow-up in 1 year for next CPE See After Visit Summary for Counseling Recommendations    Seasonal allergies: -Rx for Claritin sent to patient's pharmacy  Depression: -Refill on Celexa 20 mg sent to pharmacy  Hemiplegic migraines -Patient to continue following with neurology -Continue Fioricet as needed.  Continue verapamil daily.  F/u in the next few months  Grier Mitts, MD

## 2018-04-16 NOTE — Patient Instructions (Signed)
Preventive Care 40-64 Years, Female Preventive care refers to lifestyle choices and visits with your health care provider that can promote health and wellness. What does preventive care include?  A yearly physical exam. This is also called an annual well check.  Dental exams once or twice a year.  Routine eye exams. Ask your health care provider how often you should have your eyes checked.  Personal lifestyle choices, including: ? Daily care of your teeth and gums. ? Regular physical activity. ? Eating a healthy diet. ? Avoiding tobacco and drug use. ? Limiting alcohol use. ? Practicing safe sex. ? Taking low-dose aspirin daily starting at age 45. ? Taking vitamin and mineral supplements as recommended by your health care provider. What happens during an annual well check? The services and screenings done by your health care provider during your annual well check will depend on your age, overall health, lifestyle risk factors, and family history of disease. Counseling Your health care provider may ask you questions about your:  Alcohol use.  Tobacco use.  Drug use.  Emotional well-being.  Home and relationship well-being.  Sexual activity.  Eating habits.  Work and work Statistician.  Method of birth control.  Menstrual cycle.  Pregnancy history.  Screening You may have the following tests or measurements:  Height, weight, and BMI.  Blood pressure.  Lipid and cholesterol levels. These may be checked every 5 years, or more frequently if you are over 81 years old.  Skin check.  Lung cancer screening. You may have this screening every year starting at age 45 if you have a 30-pack-year history of smoking and currently smoke or have quit within the past 15 years.  Fecal occult blood test (FOBT) of the stool. You may have this test every year starting at age 45.  Flexible sigmoidoscopy or colonoscopy. You may have a sigmoidoscopy every 5 years or a colonoscopy  every 10 years starting at age 45.  Hepatitis C blood test.  Hepatitis B blood test.  Sexually transmitted disease (STD) testing.  Diabetes screening. This is done by checking your blood sugar (glucose) after you have not eaten for a while (fasting). You may have this done every 1-3 years.  Mammogram. This may be done every 1-2 years. Talk to your health care provider about when you should start having regular mammograms. This may depend on whether you have a family history of breast cancer.  BRCA-related cancer screening. This may be done if you have a family history of breast, ovarian, tubal, or peritoneal cancers.  Pelvic exam and Pap test. This may be done every 3 years starting at age 45. Starting at age 45, this may be done every 5 years if you have a Pap test in combination with an HPV test.  Bone density scan. This is done to screen for osteoporosis. You may have this scan if you are at high risk for osteoporosis.  Discuss your test results, treatment options, and if necessary, the need for more tests with your health care provider. Vaccines Your health care provider may recommend certain vaccines, such as:  Influenza vaccine. This is recommended every year.  Tetanus, diphtheria, and acellular pertussis (Tdap, Td) vaccine. You may need a Td booster every 10 years.  Varicella vaccine. You may need this if you have not been vaccinated.  Zoster vaccine. You may need this after age 5.  Measles, mumps, and rubella (MMR) vaccine. You may need at least one dose of MMR if you were born in  1957 or later. You may also need a second dose.  Pneumococcal 13-valent conjugate (PCV13) vaccine. You may need this if you have certain conditions and were not previously vaccinated.  Pneumococcal polysaccharide (PPSV23) vaccine. You may need one or two doses if you smoke cigarettes or if you have certain conditions.  Meningococcal vaccine. You may need this if you have certain  conditions.  Hepatitis A vaccine. You may need this if you have certain conditions or if you travel or work in places where you may be exposed to hepatitis A.  Hepatitis B vaccine. You may need this if you have certain conditions or if you travel or work in places where you may be exposed to hepatitis B.  Haemophilus influenzae type b (Hib) vaccine. You may need this if you have certain conditions.  Talk to your health care provider about which screenings and vaccines you need and how often you need them. This information is not intended to replace advice given to you by your health care provider. Make sure you discuss any questions you have with your health care provider. Document Released: 01/01/2016 Document Revised: 08/24/2016 Document Reviewed: 10/06/2015 Elsevier Interactive Patient Education  2018 [employer]  

## 2018-04-23 ENCOUNTER — Ambulatory Visit: Payer: PRIVATE HEALTH INSURANCE | Admitting: Endocrinology

## 2018-04-23 ENCOUNTER — Encounter: Payer: Self-pay | Admitting: Endocrinology

## 2018-04-23 VITALS — BP 100/78 | HR 61 | Ht 63.0 in | Wt 132.0 lb

## 2018-04-23 DIAGNOSIS — E049 Nontoxic goiter, unspecified: Secondary | ICD-10-CM | POA: Insufficient documentation

## 2018-04-23 DIAGNOSIS — R002 Palpitations: Secondary | ICD-10-CM

## 2018-04-23 NOTE — Progress Notes (Signed)
Patient ID: Whitney Kent, female   DOB: 1971-11-14, 47 y.o.   MRN: [id number]          Reason for Appointment: Goiter, new consultation    History of Present Illness:   Patient has been referred by her PCP Dr. Volanda Napoleon and also by her gynecologist after her recent exam  The patient's thyroid enlargement was first discovered in the year 2009 At that time she was having nonspecific symptoms such as cough and had a goiter on examination No specific diagnosis was given to her She thinks she had small nodules but no intervention was needed She has never been on thyroid supplementation in the past  She was also periodically followed by an endocrinologist at that time and probably not for the last several years  She has had no difficulty with swallowing  Does not feel like she has any choking sensation in her neck or pressure in any position or when lying down.  Lab Results  Component Value Date   FREET4 0.89 09/25/2017   TSH 0.74 09/25/2017    She has had an ultrasound exam in 2009 which showed subcentimeter nodules and relatively larger thyroid enlargement on the right    Allergies as of 04/23/2018   No Known Allergies     Medication List        Accurate as of 04/23/18  4:08 PM. Always use your most recent med list.          AMBULATORY NON FORMULARY MEDICATION Medication Name:  Dr Holli Humbles Probiotics- Prebiotics, Probiotics, Postbiotics-1 capsule daily   AMBULATORY NON FORMULARY MEDICATION Medication Name: IBgard - Take as directed   aspirin EC 81 MG tablet Take 81 mg by mouth daily.   butalbital-acetaminophen-caffeine 50-325-40 MG tablet Commonly known as:  FIORICET, ESGIC Take 1 tablet by mouth every 6 (six) hours as needed for headache.   citalopram 20 MG tablet Commonly known as:  CELEXA Take 1 tablet (20 mg total) by mouth daily.   CYANOCOBALAMIN PO Take 1 tablet by mouth daily.   loratadine 10 MG tablet Commonly known as:  CLARITIN Take 1 tablet (10  mg total) by mouth daily.   multivitamin tablet Take 1 tablet by mouth daily.   NUVARING 0.12-0.015 MG/24HR vaginal ring Generic drug:  etonogestrel-ethinyl estradiol Place 1 each vaginally every 28 (twenty-eight) days. Insert vaginally and leave in place for 3 consecutive weeks, then remove for 1 week.   omeprazole 20 MG capsule Commonly known as:  PRILOSEC Take 1 capsule (20 mg total) by mouth daily.   verapamil 120 MG CR tablet Commonly known as:  CALAN-SR Take 1 tablet (120 mg total) by mouth at bedtime.   VITAMIN C PO Take 1 tablet by mouth daily.   Vitamin D (Ergocalciferol) 50000 units Caps capsule Commonly known as:  DRISDOL Take 1 capsule (50,000 Units total) by mouth every 7 (seven) days.   Vitamin D3 3000 units Tabs Take by mouth daily.       Allergies: No Known Allergies  Past Medical History:  Diagnosis Date  . Allergy   . Anemia   . Anxiety   . Asthma    per pt no asthma sx since age 22  . Depression   . GERD (gastroesophageal reflux disease)   . Goiter   . Hemiplegic migraine    pt dx 2004.  Pt had 1 seizure after this dx.  Not sure if related to dx or not. maw  . Seizures ([place])    2004. only  1 seizure  . Syncope   . Umbilical hernia   . Vitamin D deficiency     Past Surgical History:  Procedure Laterality Date  . WISDOM TOOTH EXTRACTION     2009 x4 removed    Family History  Problem Relation Age of Onset  . Uterine cancer Mother   . Alcohol abuse Mother   . Arthritis Mother   . Depression Mother   . Drug abuse Mother   . Heart disease Mother   . Mental illness Mother   . Stroke Mother   . Kidney failure Father   . Alcohol abuse Father   . Diabetes Father   . Prostate cancer Father   . Colon cancer Father 67  . Kidney disease Father   . Depression Brother   . Arthritis Maternal Grandmother   . COPD Maternal Grandmother   . Heart attack Maternal Grandmother   . Hypertension Maternal Grandmother   . Hyperlipidemia Maternal  Grandmother   . Arthritis Maternal Grandfather   . Heart attack Maternal Grandfather   . Hyperlipidemia Maternal Grandfather   . Diabetes Paternal Grandfather   . Depression Daughter   . Asthma Daughter   . Depression Daughter   . Autism Daughter   . Esophageal cancer Neg Hx   . Pancreatic cancer Neg Hx   . Rectal cancer Neg Hx   . Stomach cancer Neg Hx     Social History:  reports that she has never smoked. She has never used smokeless tobacco. She reports that she drinks alcohol. She reports that she does not use drugs.     Review of Systems  Constitutional: Positive for malaise. Negative for weight loss.  HENT: Negative for trouble swallowing.   Cardiovascular: Positive for palpitations.       She has a long-standing history of mild transient palpitations in the morning on waking up  Gastrointestinal: Negative for constipation.  Endocrine: Positive for fatigue. Negative for cold intolerance.       She may get lightheaded if she gets up too quickly in the morning out of bed  Skin: Negative for dry skin.  Psychiatric/Behavioral: Positive for depressed mood.       Depression improved on treatment      Examination:   BP 100/78 (BP Location: Left Arm, Patient Position: Sitting, Cuff Size: Normal)   Pulse 61   Ht 5\' 3"  (1.6 m)   Wt 132 lb (59.9 kg)   LMP 04/16/2018 (Exact Date)   SpO2 97%   BMI 23.38 kg/m    General Appearance: pleasant, averagely built and nourished         Eyes: No bilaterally prominence or eyelid swelling.           Neck: The thyroid is enlarged but more prominent on the right side of the isthmus The thyroid is soft and smooth, fleshy texture Neck circumference is 33-33.5 cm over the mid thyroid There is an indistinct firm area on the right lower pole about 1 cm in size best felt on swallowing There is no deviation of the trachea There is no stridor There is no lymphadenopathy.     Cardiovascular: Normal  heart sounds, no murmur Respiratory:   Lungs clear Neurological: REFLEXES: at biceps are normal.  Skin: no rash        Assessment/Plan:  SIMPLE GOITER:  Long-standing symptom goiter with normal thyroid functions By clinical assessment her thyroid is probably not very different in size compared to the estimation by ultrasound in 2009 It  is still relatively diffuse  Since she has an indistinct nodule on the right lower lobe will need follow-up ultrasound Since she has normal thyroid levels within the last few months will not need to recheck this now  Discussed that if her ultrasound does not show any nodule formation she can be seen every 2 years for follow-up  PALPITATIONS: She appears to have symptoms only in the morning these are long-standing and unlikely to be related to thyroid dysfunction since most recent thyroid levels were normal   A copy of the consultation note is sent to the referring physician  Elayne Snare 04/23/2018

## 2018-05-02 ENCOUNTER — Ambulatory Visit
Admission: RE | Admit: 2018-05-02 | Discharge: 2018-05-02 | Disposition: A | Discharge status: Home / Self Care | Payer: PRIVATE HEALTH INSURANCE | Source: Ambulatory Visit | Attending: Endocrinology | Admitting: Endocrinology

## 2018-05-02 DIAGNOSIS — E049 Nontoxic goiter, unspecified: Secondary | ICD-10-CM

## 2018-05-10 NOTE — Progress Notes (Signed)
Please call to let patient know that the ultrasound results are not indicative of a thyroid nodule and no further action needed

## 2018-08-24 ENCOUNTER — Other Ambulatory Visit: Payer: Self-pay

## 2018-08-24 ENCOUNTER — Telehealth: Payer: Self-pay | Admitting: Gastroenterology

## 2018-08-24 MED ORDER — RIFAXIMIN 550 MG PO TABS: 550.0000 mg | ORAL_TABLET | Freq: Three times a day (TID) | ORAL | 0 refills | Status: AC

## 2018-08-24 NOTE — Telephone Encounter (Signed)
Tried to reach patient, no answer. Looking back at colonoscopy Rifaximin was suggested. Please advise.

## 2018-08-24 NOTE — Telephone Encounter (Signed)
Thanks Almyra Free. Yes if her symptoms are persisting why don't we try Rifaximin 550mg  TID for 2 weeks for treatment of irritable bowel syndrome. Thanks

## 2018-08-24 NOTE — Telephone Encounter (Signed)
Left message for patient that we have sent the prescription to her pharmacy and to call if not better.

## 2018-09-06 ENCOUNTER — Encounter: Payer: Self-pay | Admitting: Family Medicine

## 2018-10-23 ENCOUNTER — Ambulatory Visit: Payer: PRIVATE HEALTH INSURANCE | Admitting: Family Medicine

## 2019-01-15 ENCOUNTER — Ambulatory Visit: Payer: PRIVATE HEALTH INSURANCE | Admitting: Family Medicine

## 2019-01-15 DIAGNOSIS — Z0289 Encounter for other administrative examinations: Secondary | ICD-10-CM

## 2019-06-18 ENCOUNTER — Other Ambulatory Visit: Payer: Self-pay | Admitting: Obstetrics and Gynecology

## 2019-07-08 ENCOUNTER — Other Ambulatory Visit (HOSPITAL_COMMUNITY)
Admission: RE | Admit: 2019-07-08 | Discharge: 2019-07-08 | Disposition: A | Discharge status: Home / Self Care | Payer: 59 | Source: Ambulatory Visit | Attending: Obstetrics and Gynecology | Admitting: Obstetrics and Gynecology

## 2019-07-08 DIAGNOSIS — Z1159 Encounter for screening for other viral diseases: Secondary | ICD-10-CM | POA: Diagnosis present

## 2019-07-08 LAB — SARS CORONAVIRUS 2 (TAT 6-24 HRS): SARS Coronavirus 2: NEGATIVE

## 2019-07-09 ENCOUNTER — Encounter (HOSPITAL_BASED_OUTPATIENT_CLINIC_OR_DEPARTMENT_OTHER): Payer: Self-pay | Admitting: *Deleted

## 2019-07-09 ENCOUNTER — Other Ambulatory Visit: Payer: Self-pay

## 2019-07-09 NOTE — Progress Notes (Signed)
Spoke with patient via telephone for pre op interview. NPO after MN. Patient to take prilosec AM of surgery with a sip of water. Will need CBC, UPT and ISTAT 8 AM of surgery. Arrival time 1130.

## 2019-07-11 ENCOUNTER — Other Ambulatory Visit: Payer: Self-pay

## 2019-07-11 ENCOUNTER — Ambulatory Visit (HOSPITAL_BASED_OUTPATIENT_CLINIC_OR_DEPARTMENT_OTHER)
Admission: RE | Admit: 2019-07-11 | Discharge: 2019-07-11 | Disposition: A | Discharge status: Home / Self Care | Payer: 59 | Attending: Obstetrics and Gynecology | Admitting: Obstetrics and Gynecology

## 2019-07-11 ENCOUNTER — Ambulatory Visit (HOSPITAL_BASED_OUTPATIENT_CLINIC_OR_DEPARTMENT_OTHER): Payer: 59 | Admitting: Anesthesiology

## 2019-07-11 ENCOUNTER — Other Ambulatory Visit (HOSPITAL_COMMUNITY)
Admission: RE | Admit: 2019-07-11 | Discharge: 2019-07-11 | Disposition: A | Discharge status: Home / Self Care | Payer: 59 | Source: Ambulatory Visit | Attending: Obstetrics and Gynecology | Admitting: Obstetrics and Gynecology

## 2019-07-11 ENCOUNTER — Encounter (HOSPITAL_BASED_OUTPATIENT_CLINIC_OR_DEPARTMENT_OTHER)
Admission: RE | Disposition: A | Discharge status: Home / Self Care | Payer: Self-pay | Source: Home / Self Care | Attending: Obstetrics and Gynecology

## 2019-07-11 ENCOUNTER — Encounter (HOSPITAL_BASED_OUTPATIENT_CLINIC_OR_DEPARTMENT_OTHER): Payer: Self-pay | Admitting: Emergency Medicine

## 2019-07-11 DIAGNOSIS — N840 Polyp of corpus uteri: Secondary | ICD-10-CM | POA: Diagnosis not present

## 2019-07-11 DIAGNOSIS — Z7982 Long term (current) use of aspirin: Secondary | ICD-10-CM | POA: Insufficient documentation

## 2019-07-11 DIAGNOSIS — I10 Essential (primary) hypertension: Secondary | ICD-10-CM | POA: Diagnosis not present

## 2019-07-11 DIAGNOSIS — E559 Vitamin D deficiency, unspecified: Secondary | ICD-10-CM | POA: Insufficient documentation

## 2019-07-11 DIAGNOSIS — K219 Gastro-esophageal reflux disease without esophagitis: Secondary | ICD-10-CM | POA: Diagnosis not present

## 2019-07-11 DIAGNOSIS — Z1159 Encounter for screening for other viral diseases: Secondary | ICD-10-CM | POA: Diagnosis not present

## 2019-07-11 DIAGNOSIS — F329 Major depressive disorder, single episode, unspecified: Secondary | ICD-10-CM | POA: Insufficient documentation

## 2019-07-11 DIAGNOSIS — F419 Anxiety disorder, unspecified: Secondary | ICD-10-CM | POA: Diagnosis not present

## 2019-07-11 DIAGNOSIS — N939 Abnormal uterine and vaginal bleeding, unspecified: Secondary | ICD-10-CM | POA: Insufficient documentation

## 2019-07-11 DIAGNOSIS — Z79899 Other long term (current) drug therapy: Secondary | ICD-10-CM | POA: Insufficient documentation

## 2019-07-11 HISTORY — PX: DILATATION & CURETTAGE/HYSTEROSCOPY WITH MYOSURE: SHX6511

## 2019-07-11 LAB — CBC
HCT: 42.5 % (ref 36.0–46.0)
Hemoglobin: 12.9 g/dL (ref 12.0–15.0)
MCH: 26.2 pg (ref 26.0–34.0)
MCHC: 30.4 g/dL (ref 30.0–36.0)
MCV: 86.2 fL (ref 80.0–100.0)
Platelets: 259 10*3/uL (ref 150–400)
RBC: 4.93 MIL/uL (ref 3.87–5.11)
RDW: 12.3 % (ref 11.5–15.5)
WBC: 6.9 10*3/uL (ref 4.0–10.5)
nRBC: 0 % (ref 0.0–0.2)

## 2019-07-11 LAB — SARS CORONAVIRUS 2 BY RT PCR (HOSPITAL ORDER, PERFORMED IN ~~LOC~~ HOSPITAL LAB): SARS Coronavirus 2: NEGATIVE

## 2019-07-11 LAB — POCT PREGNANCY, URINE: Preg Test, Ur: NEGATIVE

## 2019-07-11 SURGERY — DILATATION & CURETTAGE/HYSTEROSCOPY WITH MYOSURE
Anesthesia: General | Site: Vagina

## 2019-07-11 MED ORDER — SCOPOLAMINE 1 MG/3DAYS TD PT72
MEDICATED_PATCH | TRANSDERMAL | Status: AC
  Filled 2019-07-11: qty 1

## 2019-07-11 MED ORDER — ONDANSETRON HCL 4 MG/2ML IJ SOLN
INTRAMUSCULAR | Status: AC
  Filled 2019-07-11: qty 2

## 2019-07-11 MED ORDER — KETOROLAC TROMETHAMINE 30 MG/ML IJ SOLN
INTRAMUSCULAR | Status: DC | PRN
  Administered 2019-07-11: 30 mg via INTRAVENOUS

## 2019-07-11 MED ORDER — IBUPROFEN 800 MG PO TABS: 800.0000 mg | ORAL_TABLET | Freq: Three times a day (TID) | ORAL | 5 refills | Status: AC | PRN

## 2019-07-11 MED ORDER — KETOROLAC TROMETHAMINE 30 MG/ML IJ SOLN
INTRAMUSCULAR | Status: AC
  Filled 2019-07-11: qty 1

## 2019-07-11 MED ORDER — KETOROLAC TROMETHAMINE 30 MG/ML IJ SOLN
30.0000 mg | Freq: Once | INTRAMUSCULAR | Status: DC | PRN
  Filled 2019-07-11: qty 1

## 2019-07-11 MED ORDER — DEXAMETHASONE SODIUM PHOSPHATE 10 MG/ML IJ SOLN
INTRAMUSCULAR | Status: AC
  Filled 2019-07-11: qty 1

## 2019-07-11 MED ORDER — FENTANYL CITRATE (PF) 100 MCG/2ML IJ SOLN
INTRAMUSCULAR | Status: AC
  Filled 2019-07-11: qty 2

## 2019-07-11 MED ORDER — OXYCODONE HCL 5 MG PO TABS
5.0000 mg | ORAL_TABLET | Freq: Once | ORAL | Status: DC | PRN
  Filled 2019-07-11: qty 1

## 2019-07-11 MED ORDER — LACTATED RINGERS IV SOLN
INTRAVENOUS | Status: DC
  Administered 2019-07-11: 50 mL/h via INTRAVENOUS
  Administered 2019-07-11 (×2): via INTRAVENOUS
  Filled 2019-07-11: qty 1000

## 2019-07-11 MED ORDER — DEXAMETHASONE SODIUM PHOSPHATE 10 MG/ML IJ SOLN
INTRAMUSCULAR | Status: DC | PRN
  Administered 2019-07-11: 5 mg via INTRAVENOUS

## 2019-07-11 MED ORDER — FENTANYL CITRATE (PF) 100 MCG/2ML IJ SOLN
25.0000 ug | INTRAMUSCULAR | Status: DC | PRN
  Filled 2019-07-11: qty 1

## 2019-07-11 MED ORDER — ONDANSETRON HCL 4 MG/2ML IJ SOLN
4.0000 mg | Freq: Once | INTRAMUSCULAR | Status: DC | PRN
  Filled 2019-07-11: qty 2

## 2019-07-11 MED ORDER — PROPOFOL 10 MG/ML IV BOLUS
INTRAVENOUS | Status: AC
  Filled 2019-07-11: qty 20

## 2019-07-11 MED ORDER — LIDOCAINE 2% (20 MG/ML) 5 ML SYRINGE
INTRAMUSCULAR | Status: DC | PRN
  Administered 2019-07-11: 60 mg via INTRAVENOUS

## 2019-07-11 MED ORDER — MIDAZOLAM HCL 2 MG/2ML IJ SOLN
INTRAMUSCULAR | Status: DC | PRN
  Administered 2019-07-11: 1 mg via INTRAVENOUS

## 2019-07-11 MED ORDER — OXYCODONE HCL 5 MG/5ML PO SOLN
5.0000 mg | Freq: Once | ORAL | Status: DC | PRN
  Filled 2019-07-11: qty 5

## 2019-07-11 MED ORDER — MEPERIDINE HCL 25 MG/ML IJ SOLN
6.2500 mg | INTRAMUSCULAR | Status: DC | PRN
  Filled 2019-07-11: qty 1

## 2019-07-11 MED ORDER — PROPOFOL 10 MG/ML IV BOLUS
INTRAVENOUS | Status: DC | PRN
  Administered 2019-07-11: 150 mg via INTRAVENOUS
  Administered 2019-07-11: 50 mg via INTRAVENOUS

## 2019-07-11 MED ORDER — ONDANSETRON HCL 4 MG/2ML IJ SOLN
INTRAMUSCULAR | Status: DC | PRN
  Administered 2019-07-11: 4 mg via INTRAVENOUS

## 2019-07-11 MED ORDER — FENTANYL CITRATE (PF) 100 MCG/2ML IJ SOLN
INTRAMUSCULAR | Status: DC | PRN
  Administered 2019-07-11: 50 ug via INTRAVENOUS

## 2019-07-11 MED ORDER — ACETAMINOPHEN 160 MG/5ML PO SOLN
325.0000 mg | ORAL | Status: DC | PRN
  Filled 2019-07-11: qty 20.3

## 2019-07-11 MED ORDER — MIDAZOLAM HCL 2 MG/2ML IJ SOLN
INTRAMUSCULAR | Status: AC
  Filled 2019-07-11: qty 2

## 2019-07-11 MED ORDER — SODIUM CHLORIDE 0.9 % IR SOLN
Status: DC | PRN
  Administered 2019-07-11: 3000 mL

## 2019-07-11 MED ORDER — ACETAMINOPHEN 325 MG PO TABS
325.0000 mg | ORAL_TABLET | ORAL | Status: DC | PRN
  Filled 2019-07-11: qty 2

## 2019-07-11 MED ORDER — LIDOCAINE 2% (20 MG/ML) 5 ML SYRINGE
INTRAMUSCULAR | Status: AC
  Filled 2019-07-11: qty 5

## 2019-07-11 MED ORDER — ATROPINE SULFATE 0.4 MG/ML IJ SOLN
INTRAMUSCULAR | Status: AC
  Filled 2019-07-11: qty 1

## 2019-07-11 SURGICAL SUPPLY — 25 items
BIPOLAR CUTTING LOOP 21FR (ELECTRODE)
CANISTER SUCT 3000ML PPV (MISCELLANEOUS) ×2 IMPLANT
CATH ROBINSON RED A/P 16FR (CATHETERS) IMPLANT
COVER WAND RF STERILE (DRAPES) ×2 IMPLANT
DEVICE MYOSURE LITE (MISCELLANEOUS) ×1 IMPLANT
DEVICE MYOSURE REACH (MISCELLANEOUS) IMPLANT
DILATOR CANAL MILEX (MISCELLANEOUS) IMPLANT
GAUZE 4X4 16PLY RFD (DISPOSABLE) ×2 IMPLANT
GLOVE BIOGEL PI IND STRL 7.0 (GLOVE) ×1 IMPLANT
GLOVE BIOGEL PI INDICATOR 7.0 (GLOVE) ×1
GLOVE ECLIPSE 6.5 STRL STRAW (GLOVE) ×2 IMPLANT
GOWN STRL REUS W/TWL LRG LVL3 (GOWN DISPOSABLE) ×2 IMPLANT
IV NS IRRIG 3000ML ARTHROMATIC (IV SOLUTION) ×2 IMPLANT
KIT PROCEDURE FLUENT (KITS) ×2 IMPLANT
KIT TURNOVER CYSTO (KITS) ×2 IMPLANT
LOOP CUTTING BIPOLAR 21FR (ELECTRODE) IMPLANT
MYOSURE XL FIBROID (MISCELLANEOUS)
PACK VAGINAL MINOR WOMEN LF (CUSTOM PROCEDURE TRAY) ×2 IMPLANT
PAD OB MATERNITY 4.3X12.25 (PERSONAL CARE ITEMS) ×2 IMPLANT
PAD PREP 24X48 CUFFED NSTRL (MISCELLANEOUS) ×2 IMPLANT
SEAL CERVICAL OMNI LOK (ABLATOR) IMPLANT
SEAL ROD LENS SCOPE MYOSURE (ABLATOR) ×2 IMPLANT
SYSTEM TISS REMOVAL MYOSURE XL (MISCELLANEOUS) IMPLANT
TOWEL OR 17X26 10 PK STRL BLUE (TOWEL DISPOSABLE) ×2 IMPLANT
WATER STERILE IRR 500ML POUR (IV SOLUTION) ×2 IMPLANT

## 2019-07-11 NOTE — Anesthesia Procedure Notes (Signed)
Procedure Name: LMA Insertion Date/Time: 07/11/2019 2:43 PM Performed by: Wanita Chamberlain, CRNA Pre-anesthesia Checklist: Patient identified, Emergency Drugs available, Suction available and Patient being monitored Patient Re-evaluated:Patient Re-evaluated prior to induction Oxygen Delivery Method: Circle system utilized Preoxygenation: Pre-oxygenation with 100% oxygen Induction Type: IV induction Ventilation: Mask ventilation without difficulty LMA: LMA inserted LMA Size: 4.0 Number of attempts: 1 Placement Confirmation: positive ETCO2,  CO2 detector and breath sounds checked- equal and bilateral Tube secured with: Tape Dental Injury: Teeth and Oropharynx as per pre-operative assessment

## 2019-07-11 NOTE — Brief Op Note (Signed)
07/11/2019  3:11 PM  PATIENT:  Whitney Kent  48 y.o. female  PRE-OPERATIVE DIAGNOSIS:  Dysfunctional Uterine Bleeding, Endometrial Mass  POST-OPERATIVE DIAGNOSIS:  Dysfunctional Uterine Bleeding, Endometrial polyps  PROCEDURE:  Diagnostic hysteroscopy, hysteroscopic resection of endometrial polyps, dilation and curettage  SURGEON:  Surgeon(s) and Role:    * Servando Salina, MD - Primary  PHYSICIAN ASSISTANT:   ASSISTANTS: none   ANESTHESIA:   epidural Findings:  Endometrial polyps x 2. Tubal ostia seen nl endocervical canal EBL:  15 mL   BLOOD ADMINISTERED:none  DRAINS: none   LOCAL MEDICATIONS USED:  NONE  SPECIMEN:  Source of Specimen:  emc with polyps  DISPOSITION OF SPECIMEN:  PATHOLOGY  COUNTS:  YES  TOURNIQUET:  * No tourniquets in log *  DICTATION: .Other Dictation: Dictation Number [id number]  PLAN OF CARE: Discharge to home after PACU  PATIENT DISPOSITION:  PACU - hemodynamically stable.   Delay start of Pharmacological VTE agent (>24hrs) due to surgical blood loss or risk of bleeding: no

## 2019-07-11 NOTE — H&P (Signed)
Whitney Kent is an 48 y.o. female. Presents for surgical mgmt of endometrial masses noted on sonohysterogram done for AUB  Pertinent Gynecological History: Menses: irreg Bleeding: dysfunctional uterine bleeding Contraception: condoms DES exposure: denies Blood transfusions: none Sexually transmitted diseases: no past history Previous GYN Procedures: DNC  Last mammogram: normal Date: 01/23/2019 Last pap: normal Date: 01/23/2019 OB History: G3P2   Menstrual History: Menarche age:n/a Patient's last menstrual period was 06/21/2019 (approximate).    Past Medical History:  Diagnosis Date  . Allergy   . Anemia   . Anxiety   . Asthma    per pt no asthma sx since age 38  . Depression   . GERD (gastroesophageal reflux disease)   . Goiter   . Hemiplegic migraine    pt dx 2004.  Pt had 1 seizure after this dx.  Not sure if related to dx or not. maw  . Hypertension   . Seizures ([place])    2004. only 1 seizure  . Syncope   . Umbilical hernia   . Vitamin D deficiency     Past Surgical History:  Procedure Laterality Date  . WISDOM TOOTH EXTRACTION     2009 x4 removed    Family History  Problem Relation Age of Onset  . Uterine cancer Mother   . Alcohol abuse Mother   . Arthritis Mother   . Depression Mother   . Drug abuse Mother   . Heart disease Mother   . Mental illness Mother   . Stroke Mother   . Kidney failure Father   . Alcohol abuse Father   . Diabetes Father   . Prostate cancer Father   . Colon cancer Father 71  . Kidney disease Father   . Depression Brother   . Arthritis Maternal Grandmother   . COPD Maternal Grandmother   . Heart attack Maternal Grandmother   . Hypertension Maternal Grandmother   . Hyperlipidemia Maternal Grandmother   . Arthritis Maternal Grandfather   . Heart attack Maternal Grandfather   . Hyperlipidemia Maternal Grandfather   . Diabetes Paternal Grandfather   . Depression Daughter   . Asthma Daughter   . Depression Daughter    . Autism Daughter   . Esophageal cancer Neg Hx   . Pancreatic cancer Neg Hx   . Rectal cancer Neg Hx   . Stomach cancer Neg Hx     Social History:  reports that she has never smoked. She has never used smokeless tobacco. She reports current alcohol use. She reports that she does not use drugs.  Allergies: No Known Allergies  No medications prior to admission.    Review of Systems  All other systems reviewed and are negative.   Height 5' 2.5" (1.588 m), weight 63.5 kg, last menstrual period 06/21/2019. Physical Exam  Constitutional: She is oriented to person, place, and time. She appears well-developed and well-nourished.  HENT:  Head: Atraumatic.  Eyes: EOM are normal.  Neck: Neck supple.  Cardiovascular: Normal rate and regular rhythm.  Respiratory: Breath sounds normal.  Genitourinary:    Vagina normal.     Genitourinary Comments: Vulva nl Cervix nl Uterus irreg enlarged And no palp mass   Musculoskeletal: Normal range of motion.  Neurological: She is alert and oriented to person, place, and time.  Skin: Skin is warm and dry.  Psychiatric: She has a normal mood and affect.    No results found for this or any previous visit (from the past 24 hour(s)).  No results found.  Assessment/Plan: AUB Endometrial polyps P) dx hysteroscopy, D&C, hysteroscopic resection of endometrial polyps. Risk of surgery reviewed including infection, bleeding, uterine perforation( 12/998) and its risk, injury to surrounding organ structures, thermal injury, fluid overload and its mgmt, internal scar tissue . All ? answered  Pietrina Jagodzinski A Tramane Gorum 07/11/2019,

## 2019-07-11 NOTE — Anesthesia Preprocedure Evaluation (Signed)
Anesthesia Evaluation  Patient identified by MRN, date of birth, ID band Patient awake    Reviewed: Allergy & Precautions, NPO status , Patient's Chart, lab work & pertinent test results  Airway Mallampati: I       Dental no notable dental hx. (+) Teeth Intact   Pulmonary neg pulmonary ROS,    Pulmonary exam normal breath sounds clear to auscultation       Cardiovascular hypertension, Pt. on medications Normal cardiovascular exam Rhythm:Regular Rate:Normal     Neuro/Psych  Headaches, PSYCHIATRIC DISORDERS Anxiety Depression    GI/Hepatic Neg liver ROS, GERD  Medicated and Controlled,  Endo/Other  negative endocrine ROS  Renal/GU negative Renal ROS  negative genitourinary   Musculoskeletal negative musculoskeletal ROS (+)   Abdominal Normal abdominal exam  (+)   Peds  Hematology   Anesthesia Other Findings   Reproductive/Obstetrics negative OB ROS                             Anesthesia Physical Anesthesia Plan  ASA: II  Anesthesia Plan: General   Post-op Pain Management:    Induction: Intravenous  PONV Risk Score and Plan: 4 or greater and Ondansetron, Dexamethasone and Midazolam  Airway Management Planned: LMA  Additional Equipment: None  Intra-op Plan:   Post-operative Plan:   Informed Consent: I have reviewed the patients History and Physical, chart, labs and discussed the procedure including the risks, benefits and alternatives for the proposed anesthesia with the patient or authorized representative who has indicated his/her understanding and acceptance.     Dental advisory given  Plan Discussed with: CRNA  Anesthesia Plan Comments:         Anesthesia Quick Evaluation

## 2019-07-11 NOTE — Transfer of Care (Signed)
Immediate Anesthesia Transfer of Care Note  Patient: Whitney Kent  Procedure(s) Performed: DILATATION & CURETTAGE/HYSTEROSCOPY WITH MYOSURE (N/A Vagina )  Patient Location: PACU  Anesthesia Type:General  Level of Consciousness: awake, alert , oriented and patient cooperative  Airway & Oxygen Therapy: Patient Spontanous Breathing and Patient connected to nasal cannula oxygen  Post-op Assessment: Report given to RN and Post -op Vital signs reviewed and stable  Post vital signs: Reviewed and stable  Last Vitals:  Vitals Value Taken Time  BP    Temp    Pulse    Resp    SpO2      Last Pain:  Vitals:   07/11/19 1302  TempSrc: Oral  PainSc: 1       Patients Stated Pain Goal: 4 (65/53/74 8270)  Complications: No apparent anesthesia complications

## 2019-07-11 NOTE — Discharge Instructions (Signed)
°  Post Anesthesia Home Care Instructions  Activity: Get plenty of rest for the remainder of the day. A responsible individual must stay with you for 24 hours following the procedure.  For the next 24 hours, DO NOT: -Drive a car -Paediatric nurse -Drink alcoholic beverages -Take any medication unless instructed by your physician -Make any legal decisions or sign important papers.  Meals: Start with liquid foods such as gelatin or soup. Progress to regular foods as tolerated. Avoid greasy, spicy, heavy foods. If nausea and/or vomiting occur, drink only clear liquids until the nausea and/or vomiting subsides. Call your physician if vomiting continues.  Special Instructions/Symptoms: Your throat may feel dry or sore from the anesthesia or the breathing tube placed in your throat during surgery. If this causes discomfort, gargle with warm salt water. The discomfort should disappear within 24 hours.  If you had a scopolamine patch placed behind your ear for the management of post- operative nausea and/or vomiting:  1. The medication in the patch is effective for 72 hours, after which it should be removed.  Wrap patch in a tissue and discard in the trash. Wash hands thoroughly with soap and water. 2. You may remove the patch earlier than 72 hours if you experience unpleasant side effects which may include dry mouth, dizziness or visual disturbances. 3. Avoid touching the patch. Wash your hands with soap and water after contact with the patch.    DISCHARGE INSTRUCTIONS: HYSTEROSCOPY / ENDOMETRIAL ABLATION The following instructions have been prepared to help you care for yourself upon your return home.  May Remove Scop patch on or before  72 hours, July 26,2020  May take Ibuprofen after 9:00 pm  May take stool softner while taking narcotic pain medication to prevent constipation.  Drink plenty of water.  Personal hygiene:  Use sanitary pads for vaginal drainage, not tampons.  Shower the  day after your procedure.  NO tub baths, pools or Jacuzzis for 2-3 weeks.  Wipe front to back after using the bathroom.  Activity and limitations:  Do NOT drive or operate any equipment for 24 hours. The effects of anesthesia are still present and drowsiness may result.  Do NOT rest in bed all day.  Walking is encouraged.  Walk up and down stairs slowly.  You may resume your normal activity in one to two days or as indicated by your physician. Sexual activity: NO intercourse for at least 2 weeks after the procedure, or as indicated by your Doctor.  Diet: Eat a light meal as desired this evening. You may resume your usual diet tomorrow.  Return to Work: You may resume your work activities in one to two days or as indicated by Marine scientist.  What to expect after your surgery: Expect to have vaginal bleeding/discharge for 2-3 days and spotting for up to 10 days. It is not unusual to have soreness for up to 1-2 weeks. You may have a slight burning sensation when you urinate for the first day. Mild cramps may continue for a couple of days. You may have a regular period in 2-6 weeks.  Call your doctor for any of the following:  Excessive vaginal bleeding or clotting, saturating and changing one pad every hour.  Inability to urinate 6 hours after discharge from hospital.  Pain not relieved by pain medication.  Fever of 100.4 F or greater.  Unusual vaginal discharge or odor.

## 2019-07-12 ENCOUNTER — Encounter (HOSPITAL_BASED_OUTPATIENT_CLINIC_OR_DEPARTMENT_OTHER): Payer: Self-pay | Admitting: Obstetrics and Gynecology

## 2019-07-12 NOTE — Op Note (Signed)
Whitney Kent, [name] ACCOUNT [id number] DATE OF BIRTH:1971/06/15 FACILITY: [facility] LOCATION: [facility] PHYSICIAN:[name], MD  OPERATIVE REPORT  DATE OF PROCEDURE:  07/11/2019  PREOPERATIVE DIAGNOSIS:  Abnormal uterine bleeding, endometrial mass.  PROCEDURE PERFORMED:  Diagnostic hysteroscopy, hysteroscopic resection of endometrial polyps, dilation and curettage.  POSTOPERATIVE DIAGNOSIS:  Abnormal uterine bleeding, endometrial polyps.  ANESTHESIA:  General.  SURGEON:  Servando Salina, MD  ASSISTANT:  None.  DESCRIPTION OF PROCEDURE:  Under adequate general anesthesia, the patient was placed in the dorsal lithotomy position.  She was sterilely prepped and draped in the usual fashion.  The bladder was catheterized and a moderate amount of urine.  Examination  under anesthesia revealed an anteverted uterus.  No adnexal masses could be appreciated.  A bivalve speculum was placed in the vagina.  A single-tooth tenaculum was placed on the anterior lip of the cervix.  The cervix was easily dilated up to a #19  Pratt dilator.  A diagnostic hysteroscope was introduced into the uterine cavity.  Two polypoid lesions were noted.  Both tubal ostia were seen.  Using the LITE MyoSure resectoscope, the polyps were resected, as was the endometrial lining.  Once this was  done and all polypoid lesions were removed, the endocervical canal was inspected.  No lesions noted.  All instruments were then removed from the vagina.  SPECIMEN:  Labeled endometrial curettings with polyps were sent to pathology.  ESTIMATED BLOOD LOSS:  5 mL.  INTRAOPERATIVE FLUIDS:  1 L.  FLUID DEFICIT:  65 mL.  COUNTS:  Sponge and instrument count x2 was correct.  COMPLICATIONS:  None.  DISPOSITION:  The patient tolerated the procedure well and was transferred to recovery room in stable condition.  LN/NUANCE  D:07/12/2019 T:07/12/2019 JOB:[id number]

## 2019-07-12 NOTE — Anesthesia Postprocedure Evaluation (Signed)
Anesthesia Post Note  Patient: Whitney Kent  Procedure(s) Performed: DILATATION & CURETTAGE/HYSTEROSCOPY WITH MYOSURE (N/A Vagina )     Patient location during evaluation: PACU Anesthesia Type: General Level of consciousness: awake Pain management: pain level controlled Vital Signs Assessment: post-procedure vital signs reviewed and stable Respiratory status: spontaneous breathing Cardiovascular status: stable Postop Assessment: no apparent nausea or vomiting Anesthetic complications: no    Last Vitals:  Vitals:   07/11/19 1615 07/11/19 1700  BP: 114/90 113/79  Pulse: (!) 58 63  Resp: 14 14  Temp:  36.7 C  SpO2: 100% 100%    Last Pain:  Vitals:   07/12/19 1012  TempSrc:   PainSc: 0-No pain   Pain Goal: Patients Stated Pain Goal: 4 (07/11/19 1700)                 Huston Foley

## 2019-09-11 ENCOUNTER — Ambulatory Visit (INDEPENDENT_AMBULATORY_CARE_PROVIDER_SITE_OTHER): Payer: PRIVATE HEALTH INSURANCE | Admitting: Family Medicine

## 2019-09-11 ENCOUNTER — Other Ambulatory Visit: Payer: Self-pay

## 2019-09-11 ENCOUNTER — Encounter: Payer: Self-pay | Admitting: Family Medicine

## 2019-09-11 VITALS — BP 110/80 | HR 93 | Temp 98.1°F | Wt 143.0 lb

## 2019-09-11 DIAGNOSIS — K582 Mixed irritable bowel syndrome: Secondary | ICD-10-CM

## 2019-09-11 DIAGNOSIS — K219 Gastro-esophageal reflux disease without esophagitis: Secondary | ICD-10-CM

## 2019-09-11 DIAGNOSIS — Z131 Encounter for screening for diabetes mellitus: Secondary | ICD-10-CM

## 2019-09-11 DIAGNOSIS — Z13220 Encounter for screening for lipoid disorders: Secondary | ICD-10-CM | POA: Diagnosis not present

## 2019-09-11 DIAGNOSIS — F419 Anxiety disorder, unspecified: Secondary | ICD-10-CM

## 2019-09-11 DIAGNOSIS — M25562 Pain in left knee: Secondary | ICD-10-CM

## 2019-09-11 DIAGNOSIS — R58 Hemorrhage, not elsewhere classified: Secondary | ICD-10-CM

## 2019-09-11 DIAGNOSIS — F5101 Primary insomnia: Secondary | ICD-10-CM | POA: Diagnosis not present

## 2019-09-11 DIAGNOSIS — Z0000 Encounter for general adult medical examination without abnormal findings: Secondary | ICD-10-CM

## 2019-09-11 DIAGNOSIS — F329 Major depressive disorder, single episode, unspecified: Secondary | ICD-10-CM

## 2019-09-11 DIAGNOSIS — G8929 Other chronic pain: Secondary | ICD-10-CM

## 2019-09-11 DIAGNOSIS — F32A Depression, unspecified: Secondary | ICD-10-CM

## 2019-09-11 MED ORDER — CITALOPRAM HYDROBROMIDE 20 MG PO TABS: 20.0000 mg | ORAL_TABLET | Freq: Every day | ORAL | 4 refills | Status: DC

## 2019-09-11 NOTE — Patient Instructions (Addendum)
Preventive Care 46-46 Years Old, Female Preventive care refers to visits with your health care provider and lifestyle choices that can promote health and wellness. This includes:  A yearly physical exam. This may also be called an annual well check.  Regular dental visits and eye exams.  Immunizations.  Screening for certain conditions.  Healthy lifestyle choices, such as eating a healthy diet, getting regular exercise, not using drugs or products that contain nicotine and tobacco, and limiting alcohol use. What can I expect for my preventive care visit? Physical exam Your health care provider will check your:  Height and weight. This may be used to calculate body mass index (BMI), which tells if you are at a healthy weight.  Heart rate and blood pressure.  Skin for abnormal spots. Counseling Your health care provider may ask you questions about your:  Alcohol, tobacco, and drug use.  Emotional well-being.  Home and relationship well-being.  Sexual activity.  Eating habits.  Work and work environment.  Method of birth control.  Menstrual cycle.  Pregnancy history. What immunizations do I need?  Influenza (flu) vaccine  This is recommended every year. Tetanus, diphtheria, and pertussis (Tdap) vaccine  You may need a Td booster every 10 years. Varicella (chickenpox) vaccine  You may need this if you have not been vaccinated. Zoster (shingles) vaccine  You may need this after age 60. Measles, mumps, and rubella (MMR) vaccine  You may need at least one dose of MMR if you were born in 1957 or later. You may also need a second dose. Pneumococcal conjugate (PCV13) vaccine  You may need this if you have certain conditions and were not previously vaccinated. Pneumococcal polysaccharide (PPSV23) vaccine  You may need one or two doses if you smoke cigarettes or if you have certain conditions. Meningococcal conjugate (MenACWY) vaccine  You may need this if you  have certain conditions. Hepatitis A vaccine  You may need this if you have certain conditions or if you travel or work in places where you may be exposed to hepatitis A. Hepatitis B vaccine  You may need this if you have certain conditions or if you travel or work in places where you may be exposed to hepatitis B. Haemophilus influenzae type b (Hib) vaccine  You may need this if you have certain conditions. Human papillomavirus (HPV) vaccine  If recommended by your health care provider, you may need three doses over 6 months. You may receive vaccines as individual doses or as more than one vaccine together in one shot (combination vaccines). Talk with your health care provider about the risks and benefits of combination vaccines. What tests do I need? Blood tests  Lipid and cholesterol levels. These may be checked every 5 years, or more frequently if you are over 46 years old.  Hepatitis C test.  Hepatitis B test. Screening  Lung cancer screening. You may have this screening every year starting at age 46 if you have a 30-pack-year history of smoking and currently smoke or have quit within the past 15 years.  Colorectal cancer screening. All adults should have this screening starting at age 46 and continuing until age 75. Your health care provider may recommend screening at age 46 if you are at increased risk. You will have tests every 1-10 years, depending on your results and the type of screening test.  Diabetes screening. This is done by checking your blood sugar (glucose) after you have not eaten for a while (fasting). You may have this   done every 1-3 years.  Mammogram. This may be done every 1-2 years. Talk with your health care provider about when you should start having regular mammograms. This may depend on whether you have a family history of breast cancer.  BRCA-related cancer screening. This may be done if you have a family history of breast, ovarian, tubal, or peritoneal  cancers.  Pelvic exam and Pap test. This may be done every 3 years starting at age 46. Starting at age 46, this may be done every 5 years if you have a Pap test in combination with an HPV test. Other tests  Sexually transmitted disease (STD) testing.  Bone density scan. This is done to screen for osteoporosis. You may have this scan if you are at high risk for osteoporosis. Follow these instructions at home: Eating and drinking  Eat a diet that includes fresh fruits and vegetables, whole grains, lean protein, and low-fat dairy.  Take vitamin and mineral supplements as recommended by your health care provider.  Do not drink alcohol if: ? Your health care provider tells you not to drink. ? You are pregnant, may be pregnant, or are planning to become pregnant.  If you drink alcohol: ? Limit how much you have to 0-1 drink a day. ? Be aware of how much alcohol is in your drink. In the [place] one drink equals one 12 oz bottle of beer (355 mL), one 5 oz glass of wine (148 mL), or one 1 oz glass of hard liquor (44 mL). Lifestyle  Take daily care of your teeth and gums.  Stay active. Exercise for at least 30 minutes on 5 or more days each week.  Do not use any products that contain nicotine or tobacco, such as cigarettes, e-cigarettes, and chewing tobacco. If you need help quitting, ask your health care provider.  If you are sexually active, practice safe sex. Use a condom or other form of birth control (contraception) in order to prevent pregnancy and STIs (sexually transmitted infections).  If told by your health care provider, take low-dose aspirin daily starting at age 46. What's next?  Visit your health care provider once a year for a well check visit.  Ask your health care provider how often you should have your eyes and teeth checked.  Stay up to date on all vaccines. This information is not intended to replace advice given to you by your health care provider. Make sure you  discuss any questions you have with your health care provider. Document Released: 01/01/2016 Document Revised: 08/16/2018 Document Reviewed: 08/16/2018 Elsevier Patient Education  [place].  Chronic Knee Pain, Adult Knee pain that lasts longer than 3 months is called chronic knee pain. You may have pain in one or both knees. Symptoms of chronic knee pain may also include swelling and stiffness. The most common cause is age-related wear and tear (osteoarthritis) of your knee joint. Many conditions can cause chronic knee pain. Treatment depends on the cause. The main treatments are physical therapy and weight loss. It may also be treated with medicines, injections, a knee sleeve or brace, and by using crutches. Rest, ice, compression (pressure), and elevation (RICE) therapy may also be recommended. Follow these instructions at home: If you have a knee sleeve or brace:   Wear it as told by your doctor. Remove it only as told by your doctor.  Loosen it if your toes: ? Tingle. ? Become numb. ? Turn cold and blue.  Keep it clean.  If the  sleeve or brace is not waterproof: ? Do not let it get wet. ? Remove it if told by your doctor, or cover it with a watertight covering when you take a bath or shower. Managing pain, stiffness, and swelling      If told, put heat on your knee. Do this as often as told by your doctor. Use the heat source that your doctor recommends, such as a moist heat pack or a heating pad. ? If you have a removable sleeve or brace, remove it as told by your doctor. ? Place a towel between your skin and the heat source. ? Leave the heat on for 20-30 minutes. ? Remove the heat if your skin turns bright red. This is very important if you are unable to feel pain, heat, or cold. You may have a greater risk of getting burned.  If told, put ice on your knee. ? If you have a removable sleeve or brace, remove it as told by your doctor. ? Put ice in a plastic bag. ?  Place a towel between your skin and the bag. ? Leave the ice on for 20 minutes, 2-3 times a day.  Move your toes often.  Raise (elevate) the injured area above the level of your heart while you are sitting or lying down. Activity  Avoid activities where both feet leave the ground at the same time (high-impact activities). Examples are running, jumping rope, and doing jumping jacks.  Return to your normal activities as told by your doctor. Ask your doctor what activities are safe for you.  Follow the exercise plan that your doctor makes for you. Your doctor may suggest that you: ? Avoid activities that make knee pain worse. You may need to change the exercises that you do, the sports that you participate in, or your job duties. ? Wear shoes with cushioned soles. ? Avoid high-impact activities or sports that require running and sudden changes in direction. ? Do exercises or physical therapy as told by your doctor. Physical therapy is planned to match your needs and abilities. ? Do exercises that increase your balance and strength, such as tai chi and yoga.  Do not use your injured knee to support your body weight until your doctor says that you can. Use crutches, a cane, or a walker, as told by your doctor. General instructions  Take over-the-counter and prescription medicines only as told by your doctor.  If you are overweight, work with your doctor and a food expert (dietitian) to set goals to lose weight. Being overweight can make your knee hurt more.  Do not use any products that contain nicotine or tobacco, such as cigarettes, e-cigarettes, and chewing tobacco. If you need help quitting, ask your doctor.  Keep all follow-up visits as told by your doctor. This is important. Contact a doctor if:  You have knee pain that is not getting better or gets worse.  You are not able to do your exercises due to knee pain. Get help right away if:  Your knee swells and the swelling becomes  worse.  You cannot move your knee.  You have very bad knee pain. Summary  Knee pain that lasts more than 3 months is considered chronic knee pain.  The main treatments for chronic knee pain are physical therapy and weight loss. You may also need to take medicines, wear a knee sleeve or brace, use crutches, and put ice or heat on your knee.  Lose weight if you are overweight.  Work with your doctor and a food expert (dietitian) to help you set goals to lose weight. Being overweight can make your knee hurt more.  Work with a physical therapist to make a safe exercise program, as told by your doctor. This information is not intended to replace advice given to you by your health care provider. Make sure you discuss any questions you have with your health care provider. Document Released: 02/14/2019 Document Revised: 02/14/2019 Document Reviewed: 02/14/2019 Elsevier Patient Education  2020 [employer].

## 2019-09-13 ENCOUNTER — Encounter: Payer: Self-pay | Admitting: Family Medicine

## 2019-09-13 NOTE — Progress Notes (Signed)
Subjective:     Whitney Kent is a 48 y.o. female and is here for a comprehensive physical exam. The patient reports problems - IBS, GERD, insominia, migraines, anxiety, L knee pain, ecchymosis.  IBS/GERD- tried probiotic, IB guard, pre and post from?.  Pt tryin to adheare to a low FODMAP diet.  Notes some improvement as gained weight.  Eating mostly plant based diet.  Walking for exercise.  Followed by GI.  Migraines:  Notes improving.  Had one yesterday, 1-2 pain, felt like "eyes got stuck"/ couldn't focus.  Endorses having hemiplegic migraines.  Taking ASA daily and verapamil for breakthrough.  Insomnia: tried melatonin which helps pt fall asleep.  If goes to bed a 9:40 pm wil lwake up at 1:30 am.  Getting 4-5 hours of sleep per night.  Anxiety: pt notes increase in symptoms.  Endorses stress at work.  Has a daughter in [place] who is doing well and a daughter who is on the autism spectrum who is in school at [employer].  Pt is helping her younger daughter with her school work. Interested in restarting celexa.  L knee pain:  Pt notes h/o pain and stiffness x a while.  Symptoms worse at night.  Denies edema.  Notes h/o being a Therapist, sports.  Also notes bruising on legs, but denies injury.  Social History   Socioeconomic History  . Marital status: Divorced    Spouse name: Not on file  . Number of children: 2  . Years of education: 71  . Highest education level: Some college, no degree  Occupational History  . Not on file  Social Needs  . Financial resource strain: Not on file  . Food insecurity    Worry: Not on file    Inability: Not on file  . Transportation needs    Medical: Not on file    Non-medical: Not on file  Tobacco Use  . Smoking status: Never Smoker  . Smokeless tobacco: Never Used  Substance and Sexual Activity  . Alcohol use: Yes    Comment: ocas  . Drug use: No  . Sexual activity: Yes    Partners: Male    Birth control/protection: Condom  Lifestyle  .  Physical activity    Days per week: Not on file    Minutes per session: Not on file  . Stress: Not on file  Relationships  . Social Herbalist on phone: Not on file    Gets together: Not on file    Attends religious service: Not on file    Active member of club or organization: Not on file    Attends meetings of clubs or organizations: Not on file    Relationship status: Not on file  . Intimate partner violence    Fear of current or ex partner: Not on file    Emotionally abused: Not on file    Physically abused: Not on file    Forced sexual activity: Not on file  Other Topics Concern  . Not on file  Social History Narrative   Lives at home with her daughter   Right handed   Drinks occasional caffeine   Health Maintenance  Topic Date Due  . Samul Dada  03/05/1992  . PAP SMEAR-Modifier  09/26/2020  . INFLUENZA VACCINE  Completed  . HIV Screening  Completed    The following portions of the patient's history were reviewed and updated as appropriate: allergies, current medications, past family history, past medical history, past social history,  past surgical history and problem list.  Review of Systems Pertinent items noted in HPI and remainder of comprehensive ROS otherwise negative.   Objective:    BP 110/80 (BP Location: Left Arm, Patient Position: Sitting, Cuff Size: Normal)   Pulse 93   Temp 98.1 F (36.7 C) (Oral)   Wt 143 lb (64.9 kg)   LMP 09/03/2019 (Exact Date)   SpO2 98%   BMI 25.74 kg/m  General appearance: alert, cooperative and no distress Head: Normocephalic, without obvious abnormality, atraumatic Eyes: conjunctivae/corneas clear. PERRL, EOM's intact. Fundi benign. Ears: normal TM's and external ear canals both ears Nose: Nares normal. Septum midline. Mucosa normal. No drainage or sinus tenderness. Throat: lips, mucosa, and tongue normal; teeth and gums normal Neck: no adenopathy, no carotid bruit, no JVD, supple, symmetrical, trachea  midline and thyroid not enlarged, symmetric, no tenderness/mass/nodules Lungs: clear to auscultation bilaterally Heart: regular rate and rhythm, S1, S2 normal, no murmur, click, rub or gallop Abdomen: soft, non-tender; bowel sounds normal; no masses,  no organomegaly Extremities: extremities normal, atraumatic, no cyanosis or edema Pulses: 2+ and symmetric Skin: Skin color, texture, turgor normal. No rashes or lesions Lymph nodes: Cervical, supraclavicular, and axillary nodes normal. Neurologic: Alert and oriented X 3, normal strength and tone. Normal symmetric reflexes. Normal coordination and gait    Assessment:    Healthy female exam.     Plan:     Anticipatory guidance given including wearing seatbelts, smoke detectors in the home, increasing physical activity, increasing p.o. intake of water and vegetables. -will obtain labs. -given handout -next CPE in 1 yr See After Visit Summary for Counseling Recommendations    Screening for cholesterol level  - Plan: Lipid panel  Screening for diabetes mellitus  - Plan: Hemoglobin A1c  Chronic pain of left knee  Gastroesophageal reflux disease, esophagitis presence not specified  - Plan: Comprehensive metabolic panel, Vitamin 123456  Irritable bowel syndrome with both constipation and diarrhea  -continue low FODMAP diet -consider f/u with GI - Plan: Comprehensive metabolic panel, Vitamin 123456  Primary insomnia -Discussed sleep hygiene -will re-evaluate symptoms and consider medication at next visit as restarting celexa.  Anxiety and depression  -PHQ9 score 8 -GAD 7 score 16 -discussed restarting celexa -consider counseling - Plan: citalopram (CELEXA) 20 MG tablet  Ecchymosis  - Plan: Comprehensive metabolic panel  F/u prn  Grier Mitts, MD

## 2019-09-17 ENCOUNTER — Other Ambulatory Visit: Payer: Self-pay

## 2019-09-17 DIAGNOSIS — R0989 Other specified symptoms and signs involving the circulatory and respiratory systems: Secondary | ICD-10-CM

## 2019-09-18 LAB — NOVEL CORONAVIRUS, NAA: SARS-CoV-2, NAA: NOT DETECTED

## 2020-02-06 ENCOUNTER — Other Ambulatory Visit: Payer: PRIVATE HEALTH INSURANCE

## 2020-02-12 ENCOUNTER — Other Ambulatory Visit: Payer: Self-pay

## 2020-02-13 ENCOUNTER — Other Ambulatory Visit (INDEPENDENT_AMBULATORY_CARE_PROVIDER_SITE_OTHER): Payer: PRIVATE HEALTH INSURANCE

## 2020-02-13 DIAGNOSIS — Z0000 Encounter for general adult medical examination without abnormal findings: Secondary | ICD-10-CM

## 2020-02-13 DIAGNOSIS — Z13220 Encounter for screening for lipoid disorders: Secondary | ICD-10-CM

## 2020-02-13 DIAGNOSIS — Z131 Encounter for screening for diabetes mellitus: Secondary | ICD-10-CM

## 2020-02-13 LAB — LIPID PANEL
Cholesterol: 163 mg/dL (ref 0–200)
HDL: 68.5 mg/dL (ref >39.00)
LDL Cholesterol: 82 mg/dL (ref 0–99)
NonHDL: 94.17
Total CHOL/HDL Ratio: 2
Triglycerides: 63 mg/dL (ref 0.0–149.0)
VLDL: 12.6 mg/dL (ref 0.0–40.0)

## 2020-02-13 LAB — CBC WITH DIFFERENTIAL/PLATELET
Basophils Absolute: 0 10*3/uL (ref 0.0–0.1)
Basophils Relative: 0.2 % (ref 0.0–3.0)
Eosinophils Absolute: 0.4 10*3/uL (ref 0.0–0.7)
Eosinophils Relative: 5.3 % — ABNORMAL HIGH (ref 0.0–5.0)
HCT: 38.5 % (ref 36.0–46.0)
Hemoglobin: 12.7 g/dL (ref 12.0–15.0)
Lymphocytes Relative: 25.8 % (ref 12.0–46.0)
Lymphs Abs: 1.7 10*3/uL (ref 0.7–4.0)
MCHC: 33 g/dL (ref 30.0–36.0)
MCV: 80.4 fl (ref 78.0–100.0)
Monocytes Absolute: 0.6 10*3/uL (ref 0.1–1.0)
Monocytes Relative: 8.6 % (ref 3.0–12.0)
Neutro Abs: 4 10*3/uL (ref 1.4–7.7)
Neutrophils Relative %: 60.1 % (ref 43.0–77.0)
Platelets: 265 10*3/uL (ref 150.0–400.0)
RBC: 4.79 Mil/uL (ref 3.87–5.11)
RDW: 13.7 % (ref 11.5–15.5)
WBC: 6.6 10*3/uL (ref 4.0–10.5)

## 2020-02-14 LAB — HEMOGLOBIN A1C: Hgb A1c MFr Bld: 5.5 % (ref 4.6–6.5)

## 2020-02-16 ENCOUNTER — Ambulatory Visit: Payer: PRIVATE HEALTH INSURANCE | Attending: Internal Medicine

## 2020-02-16 DIAGNOSIS — Z23 Encounter for immunization: Secondary | ICD-10-CM | POA: Insufficient documentation

## 2020-02-16 NOTE — Progress Notes (Signed)
   Covid-19 Vaccination Clinic  Name:  Whitney Kent    MRN: [id number] DOB: 28-Dec-1970  02/16/2020  Whitney Kent was observed post Covid-19 immunization for 15 minutes without incidence. She was provided with Vaccine Information Sheet and instruction to access the V-Safe system.   Whitney Kent was instructed to call 911 with any severe reactions post vaccine: Marland Kitchen Difficulty breathing  . Swelling of your face and throat  . A fast heartbeat  . A bad rash all over your body  . Dizziness and weakness    Immunizations Administered    Name Date Dose VIS Date Route   Pfizer COVID-19 Vaccine 02/16/2020  4:39 PM 0.3 mL 11/29/2019 Intramuscular   Manufacturer: [place]   Lot: [id number]   [place]: [id number]

## 2020-03-17 ENCOUNTER — Ambulatory Visit: Payer: PRIVATE HEALTH INSURANCE | Attending: Internal Medicine

## 2020-03-17 DIAGNOSIS — Z23 Encounter for immunization: Secondary | ICD-10-CM

## 2020-03-17 NOTE — Progress Notes (Signed)
   Covid-19 Vaccination Clinic  Name:  Jodye Hemmings    MRN: [id number] DOB: [birth year]  03/17/2020  Ms. Freel was observed post Covid-19 immunization for 30 minutes based on pre-vaccination screening without incident. She was provided with Vaccine Information Sheet and instruction to access the V-Safe system.   Ms. Brohl was instructed to call 911 with any severe reactions post vaccine: Marland Kitchen Difficulty breathing  . Swelling of face and throat  . A fast heartbeat  . A bad rash all over body  . Dizziness and weakness   Immunizations Administered    Name Date Dose VIS Date Route   Pfizer COVID-19 Vaccine 03/17/2020  3:18 PM 0.3 mL 11/29/2019 Intramuscular   Manufacturer: [place]   Lot: [id number]   NDC: [place] COVID-19 Vaccine 03/17/2020  3:30 PM 0.3 mL 11/29/2019 Intramuscular   Manufacturer: [place]   Lot: [id number]   [place]: [id number]

## 2020-03-23 ENCOUNTER — Encounter: Payer: Self-pay | Admitting: Family Medicine

## 2020-03-23 ENCOUNTER — Telehealth (INDEPENDENT_AMBULATORY_CARE_PROVIDER_SITE_OTHER): Payer: PRIVATE HEALTH INSURANCE | Admitting: Family Medicine

## 2020-03-23 DIAGNOSIS — R0982 Postnasal drip: Secondary | ICD-10-CM | POA: Diagnosis not present

## 2020-03-23 DIAGNOSIS — J019 Acute sinusitis, unspecified: Secondary | ICD-10-CM | POA: Diagnosis not present

## 2020-03-23 DIAGNOSIS — R05 Cough: Secondary | ICD-10-CM

## 2020-03-23 DIAGNOSIS — J302 Other seasonal allergic rhinitis: Secondary | ICD-10-CM

## 2020-03-23 DIAGNOSIS — R059 Cough, unspecified: Secondary | ICD-10-CM

## 2020-03-23 MED ORDER — DOXYCYCLINE HYCLATE 100 MG PO TABS: 100.0000 mg | ORAL_TABLET | Freq: Two times a day (BID) | ORAL | 0 refills | Status: AC

## 2020-03-23 MED ORDER — HYDROCODONE-HOMATROPINE 5-1.5 MG/5ML PO SYRP: 5.0000 mL | ORAL_SOLUTION | Freq: Three times a day (TID) | ORAL | 0 refills | Status: DC | PRN

## 2020-03-23 NOTE — Progress Notes (Signed)
Virtual Visit via Video Note  I connected with Whitney Kent on 03/23/20 at  8:00 AM EDT by a video enabled telemedicine application 2/2 [id number] pandemic and verified that I am speaking with the correct person using two identifiers.  Location patient: home Location provider:work or home office Persons participating in the virtual visit: patient, provider  I discussed the limitations of evaluation and management by telemedicine and the availability of in person appointments. The patient expressed understanding and agreed to proceed.   HPI: Pt started feeling sick on March 7th with nasal drainage and cough that became worse.  Seen by telehealth at work who said it was viral, but gave her Amoxicillin "just in case".  Pt tried mucinex and allergy meds.  Had a negative COVID test 3/10.  States the cough continued.  She called telehealth again and was given tessalon.  Advised to use nasal spray and a netti pot.    Pt now with continued coughing spells, constant clear nasal drainage.  States her chest is clear.  Endorses some HAs, tension around head, itchy eyes. Feels like the cough comes from her throat not her lungs. Denies ear pain, fever, facial pain or pressure, n/v,  chills.  Took zyrtec last night.    Pt completed COVID vaccine series. Second given 3/30. Has an insurance form that needs to be completed regarding CPE.  Pt mentions being sick is triggering her depression.  She is not currently on meds, but would like to get back on them.    ROS: See pertinent positives and negatives per HPI.  Past Medical History:  Diagnosis Date  . Allergy   . Anemia   . Anxiety   . Asthma    per pt no asthma sx since age 58  . Depression   . GERD (gastroesophageal reflux disease)   . Goiter   . Hemiplegic migraine    pt dx 2004.  Pt had 1 seizure after this dx.  Not sure if related to dx or not. maw  . Hypertension   . Seizures ([place])    2004. only 1 seizure  . Syncope   . Umbilical hernia    . Vitamin D deficiency     Past Surgical History:  Procedure Laterality Date  . COLONOSCOPY    . DILATATION & CURETTAGE/HYSTEROSCOPY WITH MYOSURE N/A 07/11/2019   Procedure: DILATATION & CURETTAGE/HYSTEROSCOPY WITH MYOSURE;  Surgeon: Servando Salina, MD;  Location: [facility];  Service: Gynecology;  Laterality: N/A;  . WISDOM TOOTH EXTRACTION     2009 x4 removed    Family History  Problem Relation Age of Onset  . Uterine cancer Mother   . Alcohol abuse Mother   . Arthritis Mother   . Depression Mother   . Drug abuse Mother   . Heart disease Mother   . Mental illness Mother   . Stroke Mother   . Kidney failure Father   . Alcohol abuse Father   . Diabetes Father   . Prostate cancer Father   . Colon cancer Father 26  . Kidney disease Father   . Depression Brother   . Arthritis Maternal Grandmother   . COPD Maternal Grandmother   . Heart attack Maternal Grandmother   . Hypertension Maternal Grandmother   . Hyperlipidemia Maternal Grandmother   . Arthritis Maternal Grandfather   . Heart attack Maternal Grandfather   . Hyperlipidemia Maternal Grandfather   . Diabetes Paternal Grandfather   . Depression Daughter   . Asthma Daughter   .  Depression Daughter   . Autism Daughter   . Esophageal cancer Neg Hx   . Pancreatic cancer Neg Hx   . Rectal cancer Neg Hx   . Stomach cancer Neg Hx     Current Outpatient Medications:  .  AMBULATORY NON FORMULARY MEDICATION, Medication Name:  Dr Holli Humbles Probiotics- Prebiotics, Probiotics, Postbiotics-1 capsule daily, Disp: , Rfl:  .  AMBULATORY NON FORMULARY MEDICATION, Medication Name: IBgard - Take as directed, Disp: 12 capsule, Rfl: 0 .  Ascorbic Acid (VITAMIN C PO), Take 1 tablet by mouth daily. , Disp: , Rfl:  .  Ascorbic Acid (VITAMIN C) 100 MG tablet, Take 100 mg by mouth daily., Disp: , Rfl:  .  aspirin EC 81 MG tablet, Take 81 mg by mouth daily., Disp: , Rfl:  .  bisacodyl (DULCOLAX) 5 MG EC tablet, Take 10  mg by mouth daily as needed for moderate constipation., Disp: , Rfl:  .  butalbital-acetaminophen-caffeine (FIORICET, ESGIC) 50-325-40 MG tablet, Take 1 tablet by mouth every 6 (six) hours as needed for headache., Disp: 20 tablet, Rfl: 3 .  Cholecalciferol (VITAMIN D3) 3000 units TABS, Take by mouth daily., Disp: , Rfl:  .  citalopram (CELEXA) 20 MG tablet, Take 1 tablet (20 mg total) by mouth daily., Disp: 30 tablet, Rfl: 4 .  ibuprofen (ADVIL) 800 MG tablet, Take 1 tablet (800 mg total) by mouth every 8 (eight) hours as needed., Disp: 30 tablet, Rfl: 5 .  loratadine (CLARITIN) 10 MG tablet, Take 1 tablet (10 mg total) by mouth daily., Disp: 30 tablet, Rfl: 11 .  omeprazole (PRILOSEC) 20 MG capsule, Take 1 capsule (20 mg total) by mouth daily., Disp: 60 capsule, Rfl: 1 .  verapamil (CALAN-SR) 120 MG CR tablet, Take 1 tablet (120 mg total) by mouth at bedtime., Disp: 30 tablet, Rfl: 3  EXAM:  VITALS per patient if applicable: RR between 123456 bpm  GENERAL: alert, oriented, appears well and in no acute distress  HEENT: atraumatic, conjunctiva clear, no obvious abnormalities on inspection of external nose and ears  NECK: normal movements of the head and neck  LUNGS: occasional cough,  on inspection no signs of respiratory distress, breathing rate appears normal, no obvious gross SOB, gasping or wheezing  CV: no obvious cyanosis  MS: moves all visible extremities without noticeable abnormality  PSYCH/NEURO: pleasant and cooperative, no obvious depression or anxiety, speech and thought processing grossly intact  ASSESSMENT AND PLAN:  Discussed the following assessment and plan:  Seasonal allergies -discussed continuing flonase and netti pot. -restart daily allergy med such as Zyrtec.  Consider switching to a different OTC med.  Post-nasal drainage -likely 2/2 allergies, URI -continue flonase.  Subacute sinusitis, unspecified location  -continue supportive care - Plan: doxycycline  (VIBRA-TABS) 100 MG tablet  Cough -likely 2/2 post nasal drainage, seasonal allergies, viral etiology, asthma, acid reflux. -consider repeat COVID test -self quarantine  -given precautions. - Plan: HYDROcodone-homatropine (HYCODAN) 5-1.5 MG/5ML syrup  Will f/u in 1 wk for depression to further evaluated and discuss treatment options.  Unable to fully discuss as mentioned at the end of the appt.  F/u prn   I discussed the assessment and treatment plan with the patient. The patient was provided an opportunity to ask questions and all were answered. The patient agreed with the plan and demonstrated an understanding of the instructions.   The patient was advised to call back or seek an in-person evaluation if the symptoms worsen or if the condition fails to improve as  anticipated.   Billie Ruddy, MD

## 2020-03-25 ENCOUNTER — Ambulatory Visit: Payer: PRIVATE HEALTH INSURANCE | Admitting: Family Medicine

## 2020-04-15 ENCOUNTER — Encounter: Payer: Self-pay | Admitting: Family Medicine

## 2020-04-21 LAB — HM MAMMOGRAPHY

## 2020-04-23 ENCOUNTER — Encounter: Payer: Self-pay | Admitting: Family Medicine

## 2020-04-24 ENCOUNTER — Encounter: Payer: Self-pay | Admitting: Family Medicine

## 2020-05-06 ENCOUNTER — Telehealth: Payer: Self-pay

## 2020-05-06 NOTE — Telephone Encounter (Signed)
Spoke with pt regarding her form state that she will call back with a fax number for where to fax he form or she can come pick it up from the office since she is out of town

## 2020-05-11 NOTE — Telephone Encounter (Signed)
Pt picked up form on 05/11/2020.

## 2020-06-30 ENCOUNTER — Ambulatory Visit (INDEPENDENT_AMBULATORY_CARE_PROVIDER_SITE_OTHER): Payer: PRIVATE HEALTH INSURANCE

## 2020-06-30 ENCOUNTER — Ambulatory Visit: Payer: PRIVATE HEALTH INSURANCE | Admitting: Sports Medicine

## 2020-06-30 ENCOUNTER — Other Ambulatory Visit: Payer: Self-pay | Admitting: Sports Medicine

## 2020-06-30 ENCOUNTER — Encounter: Payer: Self-pay | Admitting: Sports Medicine

## 2020-06-30 ENCOUNTER — Other Ambulatory Visit: Payer: Self-pay

## 2020-06-30 DIAGNOSIS — M21619 Bunion of unspecified foot: Secondary | ICD-10-CM | POA: Diagnosis not present

## 2020-06-30 DIAGNOSIS — M79672 Pain in left foot: Secondary | ICD-10-CM | POA: Diagnosis not present

## 2020-06-30 DIAGNOSIS — M21612 Bunion of left foot: Secondary | ICD-10-CM | POA: Diagnosis not present

## 2020-06-30 DIAGNOSIS — L600 Ingrowing nail: Secondary | ICD-10-CM | POA: Diagnosis not present

## 2020-06-30 NOTE — Progress Notes (Signed)
Subjective: Whitney Kent is a 49 y.o. female patient who presents to office for evaluation of left big toe joint pain reports that for years there has been some soreness around off-and-on but the last few months the big toe has had sharp pains no known injury used to pop the joint but has stopped also reports that over the last 3 months as well has had problems with some tenderness along the left hallux medial border but denies redness warmth drainage or any other symptoms at this time.   Review of Systems  All other systems reviewed and are negative.    Patient Active Problem List   Diagnosis Date Noted   Goiter 04/23/2018   Hemiplegic migraine 02/05/2018    Current Outpatient Medications on File Prior to Visit  Medication Sig Dispense Refill   AMBULATORY NON FORMULARY MEDICATION Medication Name:  Dr Holli Humbles Probiotics- Prebiotics, Probiotics, Postbiotics-1 capsule daily     AMBULATORY NON FORMULARY MEDICATION Medication Name: IBgard - Take as directed 12 capsule 0   Ascorbic Acid (VITAMIN C PO) Take 1 tablet by mouth daily.      Ascorbic Acid (VITAMIN C) 100 MG tablet Take 100 mg by mouth daily.     aspirin EC 81 MG tablet Take 81 mg by mouth daily.     bisacodyl (DULCOLAX) 5 MG EC tablet Take 10 mg by mouth daily as needed for moderate constipation.     butalbital-acetaminophen-caffeine (FIORICET, ESGIC) 50-325-40 MG tablet Take 1 tablet by mouth every 6 (six) hours as needed for headache. 20 tablet 3   Cholecalciferol (VITAMIN D3) 3000 units TABS Take by mouth daily.     citalopram (CELEXA) 20 MG tablet Take 1 tablet (20 mg total) by mouth daily. 30 tablet 4   Doxycycline Hyclate 200 MG TBEC Take 1 tablet by mouth daily.     HYDROcodone-homatropine (HYCODAN) 5-1.5 MG/5ML syrup Take 5 mLs by mouth every 8 (eight) hours as needed for cough. 120 mL 0   ibuprofen (ADVIL) 800 MG tablet Take 1 tablet (800 mg total) by mouth every 8 (eight) hours as needed. 30 tablet  5   loratadine (CLARITIN) 10 MG tablet Take 1 tablet (10 mg total) by mouth daily. 30 tablet 11   omeprazole (PRILOSEC) 20 MG capsule Take 1 capsule (20 mg total) by mouth daily. 60 capsule 1   verapamil (CALAN-SR) 120 MG CR tablet Take 1 tablet (120 mg total) by mouth at bedtime. 30 tablet 3   No current facility-administered medications on file prior to visit.    No Known Allergies  Objective:  General: Alert and oriented x3 in no acute distress  Dermatology: No open lesions bilateral lower extremities, no webspace macerations, no ecchymosis bilateral, all nails x 10 are well manicured no acute signs of ingrowing noted but there is mild incurvation at the distal margin of the left hallux medial border that is suspicious of early ingrown with no acute signs of infection.  Vascular: Dorsalis Pedis and Posterior Tibial pedal pulses 2/4, Capillary Fill Time 3 seconds, (+) pedal hair growth bilateral, no edema bilateral lower extremities, Temperature gradient within normal limits.  Neurology: Gross sensation intact via light touch bilateral.  Musculoskeletal: Mild tenderness with palpation left bunion deformity, no limitation or crepitus with range of motion, deformity reducible, tracking not trackbound, there is no 1st ray hypermobility noted bilateral. Midtarsal, Subtalar joint, and ankle joint range of motion is within normal limits. 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   Gait: Non-Antalgic gait with increased medial arch collapse and pronatory influence noted  Xrays  Left Foot    Impression: Very early increase in intermetatarsal angle above no other significant deformity noted.     Assessment and Plan: Problem List Items Addressed This Visit    None    Visit Diagnoses    Pain in  left foot    -  Primary   Relevant Orders   DG Foot Complete Left   Bunion       Ingrowing nail           -Complete examination performed -Xrays reviewed -Discussed treatement options; discussed HAV deformity;conservative and  Surgical management; risks, benefits, alternatives discussed. All patient's questions answered. -Dispensed bunion shield for patient to use on left -Recommend continue with good supportive shoes and inserts.  -Recommend topical Voltaren to the joint as needed on the left -And no additional charge mechanically debrided left hallux toenail and advised patient if this provides relief will benefit from keeping her nails trimmed however if recurs may benefit from nail avulsion procedure -Patient to return to office as needed or sooner if condition worsens.  Landis Martins, DPM

## 2020-06-30 NOTE — Patient Instructions (Signed)
Topical Voltaren OTC to use for pain at bunion on left as needed   For tennis shoes recommend:  [place] Can be purchased at [employer] or [employer] arch fit Can be purchased at any major retailers  Vionic  SAS Can be purchased at [employer] or [employer]   For work shoes recommend: [employer] Can be purchased at a variety of places or Engineer, maintenance (IT)   For casual shoes recommend: Vionic  Can be purchased at [employer] or [employer]   For Over the [employer] recommend: [employer] Can be purchased in office/[employer] and [place] Can be purchased at [employer] or [employer] Can be purchased at [employer]

## 2020-07-24 ENCOUNTER — Other Ambulatory Visit: Payer: Self-pay

## 2020-07-24 ENCOUNTER — Encounter: Payer: Self-pay | Admitting: Family Medicine

## 2020-07-24 ENCOUNTER — Ambulatory Visit: Payer: PRIVATE HEALTH INSURANCE | Admitting: Family Medicine

## 2020-07-24 VITALS — BP 110/78 | HR 86 | Temp 98.1°F | Wt 149.0 lb

## 2020-07-24 DIAGNOSIS — R202 Paresthesia of skin: Secondary | ICD-10-CM

## 2020-07-24 DIAGNOSIS — M7052 Other bursitis of knee, left knee: Secondary | ICD-10-CM | POA: Diagnosis not present

## 2020-07-24 NOTE — Patient Instructions (Signed)
Paresthesia Paresthesia is an abnormal burning or prickling sensation. It is usually felt in the hands, arms, legs, or feet. However, it may occur in any part of the body. Usually, paresthesia is not painful. It may feel like:  Tingling or numbness.  Buzzing.  Itching. Paresthesia may occur without any clear cause, or it may be caused by:  Breathing too quickly (hyperventilation).  Pressure on a nerve.  An underlying medical condition.  Side effects of a medication.  Nutritional deficiencies.  Exposure to toxic chemicals. Most people experience temporary (transient) paresthesia at some time in their lives. For some people, it may be long-lasting (chronic) because of an underlying medical condition. If you have paresthesia that lasts a long time, you may need to be evaluated by your health care provider. Follow these instructions at home: Alcohol use   Do not drink alcohol if: ? Your health care provider tells you not to drink. ? You are pregnant, may be pregnant, or are planning to become pregnant.  If you drink alcohol: ? Limit how much you use to:  0-1 drink a day for women.  0-2 drinks a day for men. ? Be aware of how much alcohol is in your drink. In the [place] one drink equals one 12 oz bottle of beer (355 mL), one 5 oz glass of wine (148 mL), or one 1 oz glass of hard liquor (44 mL). Nutrition   Eat a healthy diet. This includes: ? Eating foods that are high in fiber, such as fresh fruits and vegetables, whole grains, and beans. ? Limiting foods that are high in fat and processed sugars, such as fried or sweet foods. General instructions  Take over-the-counter and prescription medicines only as told by your health care provider.  Do not use any products that contain nicotine or tobacco, such as cigarettes and e-cigarettes. These can keep blood from reaching damaged nerves. If you need help quitting, ask your health care provider.  If you have diabetes, work  closely with your health care provider to keep your blood sugar under control.  If you have numbness in your feet: ? Check every day for signs of injury or infection. Watch for redness, warmth, and swelling. ? Wear padded socks and comfortable shoes. These help protect your feet.  Keep all follow-up visits as told by your health care provider. This is important. Contact a health care provider if you:  Have paresthesia that gets worse or does not go away.  Have a burning or prickling feeling that gets worse when you walk.  Have pain, cramps, or dizziness.  Develop a rash. Get help right away if you:  Feel weak.  Have trouble walking or moving.  Have problems with speech, understanding, or vision.  Feel confused.  Cannot control your bladder or bowel movements.  Have numbness after an injury.  Develop new weakness in an arm or leg.  Faint. Summary  Paresthesia is an abnormal burning or prickling sensation that is usually felt in the hands, arms, legs, or feet. It may also occur in other parts of the body.  Paresthesia may occur without any clear cause, or it may be caused by breathing too quickly (hyperventilation), pressure on a nerve, an underlying medical condition, side effects of a medication, nutritional deficiencies, or exposure to toxic chemicals.  If you have paresthesia that lasts a long time, you may need to be evaluated by your health care provider. This information is not intended to replace advice given to you by   your health care provider. Make sure you discuss any questions you have with your health care provider. Document Revised: 12/31/2018 Document Reviewed: 12/14/2017 Elsevier Patient Education  [place].  Iliotibial Bursitis  Iliotibial bursitis is inflammation of the bursa on the outside of the knee. A bursa is a fluid-filled sac that is often found near a joint. Bursas act as cushions to help tendons glide smoothly over bony surfaces during  joint movement. The iliotibial bursa is located beneath a long tendon (iliotibial band) that connects muscles of the buttock, hip, and upper leg to the outside of the shin bone. This condition is also called iliotibial band friction syndrome. What are the causes? This condition is caused by:  Repeated rubbing of the tendon over the bursa, which occurs when you do activities over and over. This friction causes fluid to build up inside the bursa.  The buildup of fluid inside the bursa causes it to swell. The swollen bursa causes pain in the area where it is located. What increases the risk? The following factors may make you more likely to develop this condition:  Doing athletic activities that involve repetitive squatting, running, cutting, and side-to-side movements.  Overtraining, or starting a new athletic activity without gradually increasing your time and distance.  Participating in certain sports, such as: ? Basketball. ? Cross-country running. ? Football. ? Rugby. ? Racquet sports. ? Soccer. ? Volleyball. ? Cycling.  Being 61-42 years old and having knee arthritis.  Being a middle-aged woman who is overweight.  Having flat feet or knee deformities.  Having diabetes. What are the signs or symptoms? Symptoms of this condition include:  Pain on the outside of your knee. The pain may also be felt on the outside of your leg near the knee.  Tenderness when pressing on the side of your knee.  Knee swelling that may or may not include increased warmth or redness.  Pain that gets worse with activity such as: ? Kneeling. ? Walking down the stairs. ? Prolonged walking or running. How is this diagnosed? This condition is usually diagnosed based on your symptoms, your medical history, and a physical exam. During the exam, your health care provider will check your:  Knee motion.  Knee strength.  Amount of pain when the outside of your knee is touched or pressed  on.  Ability to do activities such as walking or climbing stairs. Rarely, other tests may be done to rule out other causes of your symptoms. These tests may include:  MRI.  Ultrasound. How is this treated? Treatment for this condition may include:  Avoiding activities that cause pain and swelling.  Icing your knee.  Applying heat to your knee.  Wearing an elastic wrap or sleeve to support your knee.  Keeping your knee raised (elevated) when resting.  Taking medicine to reduce pain and swelling.  Having an injection of numbing medicine or anti-inflammatory medicine (steroid) into the bursa to see if the pain will go away.  Doing stretching and strengthening exercises. Treatment usually improves the pain in 6-8 weeks. Surgery is sometimes needed to drain or remove the bursa. Follow these instructions at home: If you have a compression wrap or sleeve:  Wear it as told by your health care provider. Remove it only as told by your health care provider.  Loosen the wrap or sleeve if your foot or toes tingle, become numb, or turn cold and blue.  Keep the wrap or sleeve clean.  If the wrap or sleeve is  not waterproof: ? Do not let it get wet. ? Cover it with a watertight covering when you take a bath or shower. Managing pain, stiffness, and swelling      If directed, put ice on the knee. ? If you have a removable wrap or sleeve, remove it as told by your health care provider. ? Put ice in a plastic bag. ? Place a towel between your skin and the bag. ? Leave the ice on for 20 minutes, 2-3 times a day.  Move your toes often to avoid stiffness and to lessen swelling.  Raise (elevate) your leg above the level of your heart while you are sitting or lying down.  If directed, apply heat to the affected area. Use the heat source that your health care provider recommends, such as a moist heat pack or a heating pad. ? Place a towel between your skin and the heat source. ? Leave  the heat on for 20-30 minutes. ? Remove the heat if your skin turns bright red. This is especially important if you are unable to feel pain, heat, or cold. You may have a greater risk of getting burned. Activity  Return to your normal activities as told by your health care provider. Ask your health care provider what activities are safe for you.  Do exercises as told by your health care provider. General instructions  Take over-the-counter and prescription medicines only as told by your health care provider.  Keep all follow-up visits as told by your health care provider. This is important. How is this prevented?  Warm up and stretch before being active.  Cool down and stretch after being active.  Give your body time to rest between periods of activity.  Make sure to use equipment that fits you.  Maintain physical fitness, including: ? Strength. ? Flexibility. Contact a health care provider if:  You have pain that is not relieved by rest or treatment.  Your symptoms get worse or do not improve with home care. Summary  Iliotibial bursitis is inflammation of the bursa on the outside of the knee.  Symptoms of this condition include pain on the outside of the knee that gets worse with activity.  Treatment includes resting the knee, ice, compression, and sometimes pain medicines. This information is not intended to replace advice given to you by your health care provider. Make sure you discuss any questions you have with your health care provider. Document Revised: 03/28/2019 Document Reviewed: 05/23/2018 Elsevier Patient Education  [place] Ask your health care provider which exercises are safe for you. Do exercises exactly as told by your health care provider and adjust them as directed. It is normal to feel mild stretching, pulling, tightness, or discomfort as you do these exercises. Stop right away if you feel sudden pain or your pain  gets worse. Do not begin these exercises until told by your health care provider. Stretching and range-of-motion exercises These exercises warm up your muscles and joints and improve the movement and flexibility of your leg. These exercises also help to relieve pain and stiffness. Quadriceps stretch, prone  1. Lie on your abdomen (prone position) on a firm surface, such as a bed or padded floor. 2. Bend your left / right knee and reach back to hold your ankle or pant leg. If you cannot reach your ankle or pant leg, loop a belt around your foot and grab the belt instead. 3. Gently pull your heel toward your buttocks.  Your knee should not slide out to the side. You should feel a stretch in the front of your thigh and knee (quadriceps). 4. Hold this position for __________ seconds. Repeat __________ times. Complete this exercise __________ times a day. Lunge This exercise stretches the muscle in the inner thigh (adductor). 1. Stand and spread your legs about 3 ft (1 m) apart. Put your left / right leg slightly back for balance. 2. Lean away from your left / right leg by bending your other knee and shifting your weight toward your bent knee. You may rest your hands on your thigh for balance. You should feel a stretch in your left / right inner thigh. 3. Hold this position for __________ seconds. Repeat __________ times. Complete this exercise __________ times a day. Hamstring stretch, supine  1. Lie on your back (supine position). 2. Loop a belt or towel over the ball of your left / right foot. The ball of your foot is on the walking surface, right under your toes. 3. Straighten your left / right knee and slowly pull on the belt or towel to raise your leg. Stop when you feel a gentle stretch in the back of your left / right knee or thigh (hamstrings). ? Do not let your left / right knee bend. ? Keep your other leg flat on the floor. 4. Hold this position for __________ seconds. Repeat __________  times. Complete this exercise __________ times a day. Strengthening exercises These exercises build strength and endurance in your leg. Endurance is the ability to use your muscles for a long time, even after they get tired. Wall slides This exercise strengthens the muscles in the front of your thigh and knee (quadriceps). 1. Lean your back against a smooth wall or door, and walk your feet out 18-24 inches (46-61 cm) from it. 2. Place your feet hip-width apart. 3. Slowly slide down the wall or door until your knees bend as far as told by your health care provider. Keep your knees over your heels, not your toes. Keep your knees in line with your hips. 4. Hold this position for __________ seconds. 5. Use the muscles in the front of your thigh to push yourself up to the standing position. 6. Rest for __________ seconds after each repetition. Repeat __________ times. Complete this exercise __________ times a day. Straight leg raises, side-lying This exercise is sometimes called a hip abductor exercise. It strengthens the muscles that rotate the leg at the hip and move it away from your body (hip abductors). 1. Lie on your side with your left / right leg in the top position. Lie so your head, shoulder, hip, and knee line up. Bend your bottom knee slightly to help you balance. 2. Lift your top leg 4-6 inches (10-15 cm) while keeping your toes pointed straight ahead. 3. Hold this position for __________ seconds. 4. Slowly lower your leg to the starting position. 5. Let your muscles relax completely after each repetition. Repeat __________ times. Complete this exercise __________ times a day. Straight leg raises, prone This exercise strengthens the muscles that move the hips (hip extensors). 1. Lie on your abdomen (prone position) on a firm surface. You can put a pillow under your hips if that is more comfortable for your lower back. 2. Squeeze your buttocks muscles and lift your left / right leg  about 4-6 inches (10-15 cm). Keep your knee straight as you lift your leg. 3. Hold this position for __________ seconds. 4. Slowly lower your  leg to the starting position. 5. Let your muscles relax completely after each repetition. Repeat __________ times. Complete this exercise __________ times a day. Bridge This exercise strengthens the muscles that move the hips (hip extensors). 1. Lie on your back on a firm surface with your knees bent and your feet flat on the floor. 2. Tighten your buttocks muscles and lift your bottom off the floor until the trunk of your body is level with your thighs. ? Do not arch your back. ? You should feel the muscles working in your buttocks and the back of your thighs. If you do not feel these muscles, slide your feet 1-2 inches (2.5-5 cm) farther away from your buttocks. 3. Hold this position for __________ seconds. 4. Slowly lower your hips to the starting position. 5. Let your muscles relax completely after each repetition. 6. If this exercise is too easy, try doing it with your arms crossed over your chest. Repeat __________ times. Complete this exercise __________ times a day. This information is not intended to replace advice given to you by your health care provider. Make sure you discuss any questions you have with your health care provider. Document Revised: 03/28/2019 Document Reviewed: 02/11/2019 Elsevier Patient Education  [place].  Paresthesia Paresthesia is an abnormal burning or prickling sensation. It is usually felt in the hands, arms, legs, or feet. However, it may occur in any part of the body. Usually, paresthesia is not painful. It may feel like:  Tingling or numbness.  Buzzing.  Itching. Paresthesia may occur without any clear cause, or it may be caused by:  Breathing too quickly (hyperventilation).  Pressure on a nerve.  An underlying medical condition.  Side effects of a medication.  Nutritional  deficiencies.  Exposure to toxic chemicals. Most people experience temporary (transient) paresthesia at some time in their lives. For some people, it may be long-lasting (chronic) because of an underlying medical condition. If you have paresthesia that lasts a long time, you may need to be evaluated by your health care provider. Follow these instructions at home: Alcohol use   Do not drink alcohol if: ? Your health care provider tells you not to drink. ? You are pregnant, may be pregnant, or are planning to become pregnant.  If you drink alcohol: ? Limit how much you use to:  0-1 drink a day for women.  0-2 drinks a day for men. ? Be aware of how much alcohol is in your drink. In the [place] one drink equals one 12 oz bottle of beer (355 mL), one 5 oz glass of wine (148 mL), or one 1 oz glass of hard liquor (44 mL). Nutrition   Eat a healthy diet. This includes: ? Eating foods that are high in fiber, such as fresh fruits and vegetables, whole grains, and beans. ? Limiting foods that are high in fat and processed sugars, such as fried or sweet foods. General instructions  Take over-the-counter and prescription medicines only as told by your health care provider.  Do not use any products that contain nicotine or tobacco, such as cigarettes and e-cigarettes. These can keep blood from reaching damaged nerves. If you need help quitting, ask your health care provider.  If you have diabetes, work closely with your health care provider to keep your blood sugar under control.  If you have numbness in your feet: ? Check every day for signs of injury or infection. Watch for redness, warmth, and swelling. ? Wear padded socks and comfortable  shoes. These help protect your feet.  Keep all follow-up visits as told by your health care provider. This is important. Contact a health care provider if you:  Have paresthesia that gets worse or does not go away.  Have a burning or prickling feeling  that gets worse when you walk.  Have pain, cramps, or dizziness.  Develop a rash. Get help right away if you:  Feel weak.  Have trouble walking or moving.  Have problems with speech, understanding, or vision.  Feel confused.  Cannot control your bladder or bowel movements.  Have numbness after an injury.  Develop new weakness in an arm or leg.  Faint. Summary  Paresthesia is an abnormal burning or prickling sensation that is usually felt in the hands, arms, legs, or feet. It may also occur in other parts of the body.  Paresthesia may occur without any clear cause, or it may be caused by breathing too quickly (hyperventilation), pressure on a nerve, an underlying medical condition, side effects of a medication, nutritional deficiencies, or exposure to toxic chemicals.  If you have paresthesia that lasts a long time, you may need to be evaluated by your health care provider. This information is not intended to replace advice given to you by your health care provider. Make sure you discuss any questions you have with your health care provider. Document Revised: 12/31/2018 Document Reviewed: 12/14/2017 Elsevier Patient Education  [place].  Iliotibial Bursitis  Iliotibial bursitis is inflammation of the bursa on the outside of the knee. A bursa is a fluid-filled sac that is often found near a joint. Bursas act as cushions to help tendons glide smoothly over bony surfaces during joint movement. The iliotibial bursa is located beneath a long tendon (iliotibial band) that connects muscles of the buttock, hip, and upper leg to the outside of the shin bone. This condition is also called iliotibial band friction syndrome. What are the causes? This condition is caused by:  Repeated rubbing of the tendon over the bursa, which occurs when you do activities over and over. This friction causes fluid to build up inside the bursa.  The buildup of fluid inside the bursa causes it to  swell. The swollen bursa causes pain in the area where it is located. What increases the risk? The following factors may make you more likely to develop this condition:  Doing athletic activities that involve repetitive squatting, running, cutting, and side-to-side movements.  Overtraining, or starting a new athletic activity without gradually increasing your time and distance.  Participating in certain sports, such as: ? Basketball. ? Cross-country running. ? Football. ? Rugby. ? Racquet sports. ? Soccer. ? Volleyball. ? Cycling.  Being 8-22 years old and having knee arthritis.  Being a middle-aged woman who is overweight.  Having flat feet or knee deformities.  Having diabetes. What are the signs or symptoms? Symptoms of this condition include:  Pain on the outside of your knee. The pain may also be felt on the outside of your leg near the knee.  Tenderness when pressing on the side of your knee.  Knee swelling that may or may not include increased warmth or redness.  Pain that gets worse with activity such as: ? Kneeling. ? Walking down the stairs. ? Prolonged walking or running. How is this diagnosed? This condition is usually diagnosed based on your symptoms, your medical history, and a physical exam. During the exam, your health care provider will check your:  Knee motion.  Knee strength.  Amount of pain when the outside of your knee is touched or pressed on.  Ability to do activities such as walking or climbing stairs. Rarely, other tests may be done to rule out other causes of your symptoms. These tests may include:  MRI.  Ultrasound. How is this treated? Treatment for this condition may include:  Avoiding activities that cause pain and swelling.  Icing your knee.  Applying heat to your knee.  Wearing an elastic wrap or sleeve to support your knee.  Keeping your knee raised (elevated) when resting.  Taking medicine to reduce pain and  swelling.  Having an injection of numbing medicine or anti-inflammatory medicine (steroid) into the bursa to see if the pain will go away.  Doing stretching and strengthening exercises. Treatment usually improves the pain in 6-8 weeks. Surgery is sometimes needed to drain or remove the bursa. Follow these instructions at home: If you have a compression wrap or sleeve:  Wear it as told by your health care provider. Remove it only as told by your health care provider.  Loosen the wrap or sleeve if your foot or toes tingle, become numb, or turn cold and blue.  Keep the wrap or sleeve clean.  If the wrap or sleeve is not waterproof: ? Do not let it get wet. ? Cover it with a watertight covering when you take a bath or shower. Managing pain, stiffness, and swelling      If directed, put ice on the knee. ? If you have a removable wrap or sleeve, remove it as told by your health care provider. ? Put ice in a plastic bag. ? Place a towel between your skin and the bag. ? Leave the ice on for 20 minutes, 2-3 times a day.  Move your toes often to avoid stiffness and to lessen swelling.  Raise (elevate) your leg above the level of your heart while you are sitting or lying down.  If directed, apply heat to the affected area. Use the heat source that your health care provider recommends, such as a moist heat pack or a heating pad. ? Place a towel between your skin and the heat source. ? Leave the heat on for 20-30 minutes. ? Remove the heat if your skin turns bright red. This is especially important if you are unable to feel pain, heat, or cold. You may have a greater risk of getting burned. Activity  Return to your normal activities as told by your health care provider. Ask your health care provider what activities are safe for you.  Do exercises as told by your health care provider. General instructions  Take over-the-counter and prescription medicines only as told by your health care  provider.  Keep all follow-up visits as told by your health care provider. This is important. How is this prevented?  Warm up and stretch before being active.  Cool down and stretch after being active.  Give your body time to rest between periods of activity.  Make sure to use equipment that fits you.  Maintain physical fitness, including: ? Strength. ? Flexibility. Contact a health care provider if:  You have pain that is not relieved by rest or treatment.  Your symptoms get worse or do not improve with home care. Summary  Iliotibial bursitis is inflammation of the bursa on the outside of the knee.  Symptoms of this condition include pain on the outside of the knee that gets worse with activity.  Treatment includes resting the knee, ice, compression,  and sometimes pain medicines. This information is not intended to replace advice given to you by your health care provider. Make sure you discuss any questions you have with your health care provider. Document Revised: 03/28/2019 Document Reviewed: 05/23/2018 Elsevier Patient Education  [place] Ask your health care provider which exercises are safe for you. Do exercises exactly as told by your health care provider and adjust them as directed. It is normal to feel mild stretching, pulling, tightness, or discomfort as you do these exercises. Stop right away if you feel sudden pain or your pain gets worse. Do not begin these exercises until told by your health care provider. Stretching and range-of-motion exercises These exercises warm up your muscles and joints and improve the movement and flexibility of your leg. These exercises also help to relieve pain and stiffness. Quadriceps stretch, prone  5. Lie on your abdomen (prone position) on a firm surface, such as a bed or padded floor. 6. Bend your left / right knee and reach back to hold your ankle or pant leg. If you cannot reach your ankle or  pant leg, loop a belt around your foot and grab the belt instead. 7. Gently pull your heel toward your buttocks. Your knee should not slide out to the side. You should feel a stretch in the front of your thigh and knee (quadriceps). 8. Hold this position for __________ seconds. Repeat __________ times. Complete this exercise __________ times a day. Lunge This exercise stretches the muscle in the inner thigh (adductor). 4. Stand and spread your legs about 3 ft (1 m) apart. Put your left / right leg slightly back for balance. 5. Lean away from your left / right leg by bending your other knee and shifting your weight toward your bent knee. You may rest your hands on your thigh for balance. You should feel a stretch in your left / right inner thigh. 6. Hold this position for __________ seconds. Repeat __________ times. Complete this exercise __________ times a day. Hamstring stretch, supine  5. Lie on your back (supine position). 6. Loop a belt or towel over the ball of your left / right foot. The ball of your foot is on the walking surface, right under your toes. 7. Straighten your left / right knee and slowly pull on the belt or towel to raise your leg. Stop when you feel a gentle stretch in the back of your left / right knee or thigh (hamstrings). ? Do not let your left / right knee bend. ? Keep your other leg flat on the floor. 8. Hold this position for __________ seconds. Repeat __________ times. Complete this exercise __________ times a day. Strengthening exercises These exercises build strength and endurance in your leg. Endurance is the ability to use your muscles for a long time, even after they get tired. Wall slides This exercise strengthens the muscles in the front of your thigh and knee (quadriceps). 7. Lean your back against a smooth wall or door, and walk your feet out 18-24 inches (46-61 cm) from it. 8. Place your feet hip-width apart. 9. Slowly slide down the wall or door until  your knees bend as far as told by your health care provider. Keep your knees over your heels, not your toes. Keep your knees in line with your hips. 10. Hold this position for __________ seconds. 11. Use the muscles in the front of your thigh to push yourself up to the standing position. 12. Rest  for __________ seconds after each repetition. Repeat __________ times. Complete this exercise __________ times a day. Straight leg raises, side-lying This exercise is sometimes called a hip abductor exercise. It strengthens the muscles that rotate the leg at the hip and move it away from your body (hip abductors). 6. Lie on your side with your left / right leg in the top position. Lie so your head, shoulder, hip, and knee line up. Bend your bottom knee slightly to help you balance. 7. Lift your top leg 4-6 inches (10-15 cm) while keeping your toes pointed straight ahead. 8. Hold this position for __________ seconds. 9. Slowly lower your leg to the starting position. 10. Let your muscles relax completely after each repetition. Repeat __________ times. Complete this exercise __________ times a day. Straight leg raises, prone This exercise strengthens the muscles that move the hips (hip extensors). 6. Lie on your abdomen (prone position) on a firm surface. You can put a pillow under your hips if that is more comfortable for your lower back. 7. Squeeze your buttocks muscles and lift your left / right leg about 4-6 inches (10-15 cm). Keep your knee straight as you lift your leg. 8. Hold this position for __________ seconds. 9. Slowly lower your leg to the starting position. 10. Let your muscles relax completely after each repetition. Repeat __________ times. Complete this exercise __________ times a day. Bridge This exercise strengthens the muscles that move the hips (hip extensors). 7. Lie on your back on a firm surface with your knees bent and your feet flat on the floor. 8. Tighten your buttocks  muscles and lift your bottom off the floor until the trunk of your body is level with your thighs. ? Do not arch your back. ? You should feel the muscles working in your buttocks and the back of your thighs. If you do not feel these muscles, slide your feet 1-2 inches (2.5-5 cm) farther away from your buttocks. 9. Hold this position for __________ seconds. 10. Slowly lower your hips to the starting position. 11. Let your muscles relax completely after each repetition. 12. If this exercise is too easy, try doing it with your arms crossed over your chest. Repeat __________ times. Complete this exercise __________ times a day. This information is not intended to replace advice given to you by your health care provider. Make sure you discuss any questions you have with your health care provider. Document Revised: 03/28/2019 Document Reviewed: 02/11/2019 Elsevier Patient Education  [employer].

## 2020-07-25 LAB — COMPLETE METABOLIC PANEL WITH GFR
AG Ratio: 1.7 (calc) (ref 1.0–2.5)
ALT: 7 U/L (ref 6–29)
AST: 15 U/L (ref 10–35)
Albumin: 4 g/dL (ref 3.6–5.1)
Alkaline phosphatase (APISO): 54 U/L (ref 31–125)
BUN: 11 mg/dL (ref 7–25)
CO2: 27 mmol/L (ref 20–32)
Calcium: 9.3 mg/dL (ref 8.6–10.2)
Chloride: 106 mmol/L (ref 98–110)
Creat: 0.67 mg/dL (ref 0.50–1.10)
GFR, Est African American: 121 mL/min/{1.73_m2} (ref >=60)
GFR, Est Non African American: 105 mL/min/{1.73_m2} (ref >=60)
Globulin: 2.4 g/dL (calc) (ref 1.9–3.7)
Glucose, Bld: 80 mg/dL (ref 65–99)
Potassium: 4 mmol/L (ref 3.5–5.3)
Sodium: 140 mmol/L (ref 135–146)
Total Bilirubin: 0.4 mg/dL (ref 0.2–1.2)
Total Protein: 6.4 g/dL (ref 6.1–8.1)

## 2020-07-25 LAB — CBC WITH DIFFERENTIAL/PLATELET
Absolute Monocytes: 525 cells/uL (ref 200–950)
Basophils Absolute: 7 cells/uL (ref 0–200)
Basophils Relative: 0.1 %
Eosinophils Absolute: 348 cells/uL (ref 15–500)
Eosinophils Relative: 4.9 %
HCT: 39.2 % (ref 35.0–45.0)
Hemoglobin: 12.7 g/dL (ref 11.7–15.5)
Lymphs Abs: 1732 cells/uL (ref 850–3900)
MCH: 26 pg — ABNORMAL LOW (ref 27.0–33.0)
MCHC: 32.4 g/dL (ref 32.0–36.0)
MCV: 80.2 fL (ref 80.0–100.0)
MPV: 11.6 fL (ref 7.5–12.5)
Monocytes Relative: 7.4 %
Neutro Abs: 4487 cells/uL (ref 1500–7800)
Neutrophils Relative %: 63.2 %
Platelets: 262 10*3/uL (ref 140–400)
RBC: 4.89 10*6/uL (ref 3.80–5.10)
RDW: 13.2 % (ref 11.0–15.0)
Total Lymphocyte: 24.4 %
WBC: 7.1 10*3/uL (ref 3.8–10.8)

## 2020-07-25 LAB — HEMOGLOBIN A1C
Hgb A1c MFr Bld: 5.5 % of total Hgb (ref <5.7)
Mean Plasma Glucose: 111 (calc)
eAG (mmol/L): 6.2 (calc)

## 2020-07-25 LAB — VITAMIN D 25 HYDROXY (VIT D DEFICIENCY, FRACTURES): Vit D, 25-Hydroxy: 21 ng/mL — ABNORMAL LOW (ref 30–100)

## 2020-07-25 LAB — TSH: TSH: 0.69 mIU/L

## 2020-07-25 LAB — T4, FREE: Free T4: 1.2 ng/dL (ref 0.8–1.8)

## 2020-07-25 LAB — MAGNESIUM: Magnesium: 1.8 mg/dL (ref 1.5–2.5)

## 2020-07-25 LAB — VITAMIN B12: Vitamin B-12: 278 pg/mL (ref 200–1100)

## 2020-07-27 ENCOUNTER — Other Ambulatory Visit: Payer: Self-pay | Admitting: Family Medicine

## 2020-07-27 ENCOUNTER — Encounter: Payer: Self-pay | Admitting: Family Medicine

## 2020-07-27 DIAGNOSIS — E559 Vitamin D deficiency, unspecified: Secondary | ICD-10-CM

## 2020-07-27 MED ORDER — VITAMIN D (ERGOCALCIFEROL) 1.25 MG (50000 UNIT) PO CAPS: 50000.0000 [IU] | ORAL_CAPSULE | ORAL | 0 refills | Status: DC

## 2020-07-30 ENCOUNTER — Encounter: Payer: Self-pay | Admitting: Family Medicine

## 2020-07-30 NOTE — Progress Notes (Signed)
Subjective:    Patient ID: Whitney Kent, female    DOB: 1971/05/26, 49 y.o.   MRN: [id number]  No chief complaint on file.   HPI Patient was seen today for ongoing concern.  Pt notes a continued dull ache in L leg x months.  Pt notes a pins and needles sensation with pressure in popliteal fossa.  Discomfort noted as a 0.5/10.  Pt denies edema in popliteal fossa, erythema, increased warmth, or injury, weakness in knee.  Pt notes sitting in an oversized office chair more since working from home.  Tried Ibuprofen.  Past Medical History:  Diagnosis Date  . Allergy   . Anemia   . Anxiety   . Asthma    per pt no asthma sx since age 6  . Depression   . GERD (gastroesophageal reflux disease)   . Goiter   . Hemiplegic migraine    pt dx 2004.  Pt had 1 seizure after this dx.  Not sure if related to dx or not. maw  . Hypertension   . Seizures ([place])    2004. only 1 seizure  . Syncope   . Umbilical hernia   . Vitamin D deficiency     No Known Allergies  ROS General: Denies fever, chills, night sweats, changes in weight, changes in appetite HEENT: Denies headaches, ear pain, changes in vision, rhinorrhea, sore throat CV: Denies CP, palpitations, SOB, orthopnea Pulm: Denies SOB, cough, wheezing GI: Denies abdominal pain, nausea, vomiting, diarrhea, constipation GU: Denies dysuria, hematuria, frequency, vaginal discharge Msk: Denies muscle cramps, joint pains  +ache in LLE. Neuro: Denies weakness, numbness, tingling  +tingling in L leg Skin: Denies rashes, bruising Psych: Denies depression, anxiety, hallucinations    Objective:    Blood pressure 110/78, pulse 86, temperature 98.1 F (36.7 C), temperature source Oral, weight 149 lb (67.6 kg), SpO2 98 %.  Gen. Pleasant, well-nourished, in no distress, normal affect   HEENT: [place]/AT, face symmetric, conjunctiva clear, no scleral icterus, PERRLA, EOMI, nares patent without drainage Lungs: no accessory muscle use, CTAB, no  wheezes or rales Cardiovascular: RRR, no m/r/g, no peripheral edema Musculoskeletal: No effusion or crepitus noted of b/l knees. TTP of lateral L knee at joint line.  No edema of popliteal fossa.  No calf tenderness b/l.  No deformities, no cyanosis or clubbing, normal tone Neuro:  A&Ox3, CN II-XII intact, normal gait, sensation in tact. Skin:  Warm, no lesions/ rash   Wt Readings from Last 3 Encounters:  07/24/20 149 lb (67.6 kg)  09/11/19 143 lb (64.9 kg)  07/11/19 141 lb 1.6 oz (64 kg)    Lab Results  Component Value Date   WBC 7.1 07/24/2020   HGB 12.7 07/24/2020   HCT 39.2 07/24/2020   PLT 262 07/24/2020   GLUCOSE 80 07/24/2020   CHOL 163 02/13/2020   TRIG 63.0 02/13/2020   HDL 68.50 02/13/2020   LDLCALC 82 02/13/2020   ALT 7 07/24/2020   AST 15 07/24/2020   NA 140 07/24/2020   K 4.0 07/24/2020   CL 106 07/24/2020   CREATININE 0.67 07/24/2020   BUN 11 07/24/2020   CO2 27 07/24/2020   TSH 0.69 07/24/2020   HGBA1C 5.5 07/24/2020    Assessment/Plan:  Paresthesia  -discussed possible causes including nerve compression, vitamin deficiency, diabetic neuropathy, baker's cyst, arthritis. -will obtain labs -discussed imaging given duration of symptoms. -will continued to monitor - Plan: CBC with Differential/Platelets, CMP with eGFR([facility]), Vitamin B12, TSH, T4, free, VITAMIN D 25 Hydroxy (  Vit-D Deficiency, Fractures), Magnesium, Hemoglobin A1c, DG Knee Complete 4 Views Left, Hemoglobin A1c  Bursitis of other bursa of left knee  -discussed supportive care: rest, ice, heat, massage, aspercreme - Plan: DG Knee Complete 4 Views Left  F/u prn  Grier Mitts, MD

## 2020-08-31 DIAGNOSIS — Z780 Asymptomatic menopausal state: Secondary | ICD-10-CM | POA: Insufficient documentation

## 2020-10-12 ENCOUNTER — Telehealth: Payer: Self-pay | Admitting: Family Medicine

## 2020-10-12 NOTE — Telephone Encounter (Signed)
Advised pt to get OTC Vit D between 1,000 - 2,000 IU after completing the Rx for 12 weeks

## 2020-10-12 NOTE — Telephone Encounter (Signed)
Pt is scheduled for her 1st B12 Injection on 10/13/2020 at 2.15 pm

## 2020-10-12 NOTE — Telephone Encounter (Signed)
Left a message for pt to return my call in the office for scheduling for B12 injection

## 2020-10-12 NOTE — Telephone Encounter (Signed)
pt requesting a B 12 injection; she also wants to know what to do after completing the vitamin D supplements

## 2020-10-12 NOTE — Telephone Encounter (Signed)
Pt returned the call and had other questions did not schedule an appointment.

## 2020-10-13 ENCOUNTER — Ambulatory Visit (INDEPENDENT_AMBULATORY_CARE_PROVIDER_SITE_OTHER): Payer: PRIVATE HEALTH INSURANCE | Admitting: *Deleted

## 2020-10-13 ENCOUNTER — Other Ambulatory Visit: Payer: Self-pay

## 2020-10-13 DIAGNOSIS — E538 Deficiency of other specified B group vitamins: Secondary | ICD-10-CM | POA: Diagnosis not present

## 2020-10-13 MED ORDER — CYANOCOBALAMIN 1000 MCG/ML IJ SOLN
1000.0000 ug | Freq: Once | INTRAMUSCULAR | Status: AC
  Administered 2020-10-13: 1000 ug via INTRAMUSCULAR

## 2020-10-13 NOTE — Progress Notes (Signed)
After obtaining consent, injection of B-12 1k mcg/64mL given in right deltoid at 2:47pm by Zygmunt Mcglinn. Berneta Sages. Patient instructed to remain in clinic for 20 minutes afterwards, and to report any adverse reaction to me immediately.

## 2020-10-26 ENCOUNTER — Encounter: Payer: Self-pay | Admitting: Family Medicine

## 2020-10-31 ENCOUNTER — Ambulatory Visit: Payer: PRIVATE HEALTH INSURANCE | Attending: Internal Medicine

## 2020-10-31 DIAGNOSIS — Z23 Encounter for immunization: Secondary | ICD-10-CM

## 2020-10-31 NOTE — Progress Notes (Signed)
   Covid-19 Vaccination Clinic  Name:  Whitney Kent    MRN: [id number] DOB: 10-22-1971  10/31/2020  [name] was observed post Covid-19 immunization for 15 minutes without incident. She was provided with Vaccine Information Sheet and instruction to access the V-Safe system.   [name] was instructed to call 911 with any severe reactions post vaccine: Marland Kitchen Difficulty breathing  . Swelling of face and throat  . A fast heartbeat  . A bad rash all over body  . Dizziness and weakness   Immunizations Administered    Name Date Dose VIS Date Route   Pfizer COVID-19 Vaccine 10/31/2020  3:23 PM 0.3 mL 10/07/2020 Intramuscular   Manufacturer: [employer]   Lot: [id number]   [place]: [id number]

## 2020-11-20 ENCOUNTER — Encounter (HOSPITAL_BASED_OUTPATIENT_CLINIC_OR_DEPARTMENT_OTHER): Payer: Self-pay | Admitting: Obstetrics and Gynecology

## 2020-11-20 ENCOUNTER — Other Ambulatory Visit: Payer: Self-pay

## 2020-11-20 NOTE — Progress Notes (Addendum)
Spoke w/ via phone for pre-op interview---pt Lab needs dos----   I stat 8, cbc, urine preg           Lab results----- mri brain -02-23-2018 epic, lov neurology dr Jaynee Eagles 04-06-2018 epic COVID test ------11-24-2020 1440 Arrive at -------53 am 11-27-2020 NPO after MN NO Solid Food.  Clear liquids from MN until---1030 am then npo Medications to take morning of surgery ----zyrtec prn, omeprazole- Diabetic medication -----n/a Patient Special Instructions -----none Pre-Op special Istructions -----none Patient verbalized understanding of instructions that were given at this phone interview. Patient denies shortness of breath, chest pain, fever, cough at this phone interview.

## 2020-11-21 ENCOUNTER — Other Ambulatory Visit: Payer: Self-pay | Admitting: Obstetrics and Gynecology

## 2020-11-24 ENCOUNTER — Other Ambulatory Visit (HOSPITAL_COMMUNITY)
Admission: RE | Admit: 2020-11-24 | Discharge: 2020-11-24 | Disposition: A | Discharge status: Home / Self Care | Payer: PRIVATE HEALTH INSURANCE | Source: Ambulatory Visit | Attending: Obstetrics and Gynecology | Admitting: Obstetrics and Gynecology

## 2020-11-24 DIAGNOSIS — Z20822 Contact with and (suspected) exposure to covid-19: Secondary | ICD-10-CM | POA: Diagnosis not present

## 2020-11-24 DIAGNOSIS — Z01812 Encounter for preprocedural laboratory examination: Secondary | ICD-10-CM | POA: Diagnosis present

## 2020-11-24 LAB — SARS CORONAVIRUS 2 (TAT 6-24 HRS): SARS Coronavirus 2: NEGATIVE

## 2020-11-26 ENCOUNTER — Other Ambulatory Visit: Payer: Self-pay | Admitting: Obstetrics and Gynecology

## 2020-11-27 ENCOUNTER — Ambulatory Visit (HOSPITAL_BASED_OUTPATIENT_CLINIC_OR_DEPARTMENT_OTHER): Payer: PRIVATE HEALTH INSURANCE | Admitting: Anesthesiology

## 2020-11-27 ENCOUNTER — Other Ambulatory Visit: Payer: Self-pay

## 2020-11-27 ENCOUNTER — Encounter (HOSPITAL_BASED_OUTPATIENT_CLINIC_OR_DEPARTMENT_OTHER): Payer: Self-pay | Admitting: Obstetrics and Gynecology

## 2020-11-27 ENCOUNTER — Encounter (HOSPITAL_BASED_OUTPATIENT_CLINIC_OR_DEPARTMENT_OTHER)
Admission: RE | Disposition: A | Discharge status: Home / Self Care | Payer: Self-pay | Source: Home / Self Care | Attending: Obstetrics and Gynecology

## 2020-11-27 ENCOUNTER — Ambulatory Visit (HOSPITAL_BASED_OUTPATIENT_CLINIC_OR_DEPARTMENT_OTHER)
Admission: RE | Admit: 2020-11-27 | Discharge: 2020-11-27 | Disposition: A | Discharge status: Home / Self Care | Payer: PRIVATE HEALTH INSURANCE | Attending: Obstetrics and Gynecology | Admitting: Obstetrics and Gynecology

## 2020-11-27 DIAGNOSIS — Z8673 Personal history of transient ischemic attack (TIA), and cerebral infarction without residual deficits: Secondary | ICD-10-CM | POA: Diagnosis not present

## 2020-11-27 DIAGNOSIS — D252 Subserosal leiomyoma of uterus: Secondary | ICD-10-CM | POA: Diagnosis not present

## 2020-11-27 DIAGNOSIS — Z302 Encounter for sterilization: Secondary | ICD-10-CM | POA: Diagnosis present

## 2020-11-27 HISTORY — PX: LAPAROSCOPIC TUBAL LIGATION: SHX1937

## 2020-11-27 HISTORY — DX: Personal history of other diseases of the respiratory system: Z87.09

## 2020-11-27 LAB — ABO/RH: ABO/RH(D): O POS

## 2020-11-27 LAB — CBC
HCT: 41.9 % (ref 36.0–46.0)
Hemoglobin: 13.4 g/dL (ref 12.0–15.0)
MCH: 26.3 pg (ref 26.0–34.0)
MCHC: 32 g/dL (ref 30.0–36.0)
MCV: 82.3 fL (ref 80.0–100.0)
Platelets: 250 10*3/uL (ref 150–400)
RBC: 5.09 MIL/uL (ref 3.87–5.11)
RDW: 12.5 % (ref 11.5–15.5)
WBC: 6.3 10*3/uL (ref 4.0–10.5)
nRBC: 0 % (ref 0.0–0.2)

## 2020-11-27 LAB — POCT I-STAT, CHEM 8
BUN: 10 mg/dL (ref 6–20)
Calcium, Ion: 1.18 mmol/L (ref 1.15–1.40)
Chloride: 102 mmol/L (ref 98–111)
Creatinine, Ser: 0.7 mg/dL (ref 0.44–1.00)
Glucose, Bld: 87 mg/dL (ref 70–99)
HCT: 42 % (ref 36.0–46.0)
Hemoglobin: 14.3 g/dL (ref 12.0–15.0)
Potassium: 3.8 mmol/L (ref 3.5–5.1)
Sodium: 139 mmol/L (ref 135–145)
TCO2: 27 mmol/L (ref 22–32)

## 2020-11-27 LAB — POCT PREGNANCY, URINE: Preg Test, Ur: NEGATIVE

## 2020-11-27 LAB — TYPE AND SCREEN
ABO/RH(D): O POS
Antibody Screen: NEGATIVE

## 2020-11-27 SURGERY — LIGATION, FALLOPIAN TUBE, LAPAROSCOPIC
Anesthesia: General | Site: Abdomen | Laterality: Bilateral

## 2020-11-27 MED ORDER — SUGAMMADEX SODIUM 200 MG/2ML IV SOLN
INTRAVENOUS | Status: DC | PRN
  Administered 2020-11-27: 200 mg via INTRAVENOUS

## 2020-11-27 MED ORDER — POVIDONE-IODINE 10 % EX SWAB: 2.0000 "application " | Freq: Once | CUTANEOUS | Status: DC

## 2020-11-27 MED ORDER — DEXMEDETOMIDINE HCL 200 MCG/2ML IV SOLN
INTRAVENOUS | Status: DC | PRN
  Administered 2020-11-27 (×3): 4 ug via INTRAVENOUS

## 2020-11-27 MED ORDER — KETOROLAC TROMETHAMINE 30 MG/ML IJ SOLN
INTRAMUSCULAR | Status: DC | PRN
  Administered 2020-11-27: 30 mg via INTRAVENOUS

## 2020-11-27 MED ORDER — LIDOCAINE HCL (PF) 2 % IJ SOLN
INTRAMUSCULAR | Status: AC
  Filled 2020-11-27: qty 5

## 2020-11-27 MED ORDER — OXYCODONE-ACETAMINOPHEN 5-325 MG PO TABS: 1.0000 | ORAL_TABLET | ORAL | 0 refills | Status: AC | PRN

## 2020-11-27 MED ORDER — LACTATED RINGERS IV SOLN: INTRAVENOUS | Status: DC

## 2020-11-27 MED ORDER — ACETAMINOPHEN 500 MG PO TABS
1000.0000 mg | ORAL_TABLET | Freq: Once | ORAL | Status: AC | PRN
  Administered 2020-11-27: 1000 mg via ORAL

## 2020-11-27 MED ORDER — ONDANSETRON HCL 4 MG/2ML IJ SOLN
INTRAMUSCULAR | Status: DC | PRN
  Administered 2020-11-27: 4 mg via INTRAVENOUS

## 2020-11-27 MED ORDER — DEXAMETHASONE SODIUM PHOSPHATE 4 MG/ML IJ SOLN
INTRAMUSCULAR | Status: DC | PRN
  Administered 2020-11-27: 10 mg via INTRAVENOUS

## 2020-11-27 MED ORDER — KETOROLAC TROMETHAMINE 30 MG/ML IJ SOLN
INTRAMUSCULAR | Status: AC
  Filled 2020-11-27: qty 1

## 2020-11-27 MED ORDER — ACETAMINOPHEN 10 MG/ML IV SOLN: 1000.0000 mg | Freq: Once | INTRAVENOUS | Status: DC | PRN

## 2020-11-27 MED ORDER — OXYCODONE HCL 5 MG/5ML PO SOLN: 5.0000 mg | Freq: Once | ORAL | Status: DC | PRN

## 2020-11-27 MED ORDER — OXYCODONE HCL 5 MG PO TABS
ORAL_TABLET | ORAL | Status: AC
  Filled 2020-11-27: qty 1

## 2020-11-27 MED ORDER — ROCURONIUM BROMIDE 100 MG/10ML IV SOLN
INTRAVENOUS | Status: DC | PRN
  Administered 2020-11-27: 50 mg via INTRAVENOUS

## 2020-11-27 MED ORDER — BUPIVACAINE HCL (PF) 0.25 % IJ SOLN
INTRAMUSCULAR | Status: DC | PRN
  Administered 2020-11-27: 2 mL

## 2020-11-27 MED ORDER — PROPOFOL 10 MG/ML IV BOLUS
INTRAVENOUS | Status: AC
  Filled 2020-11-27: qty 40

## 2020-11-27 MED ORDER — FENTANYL CITRATE (PF) 100 MCG/2ML IJ SOLN
INTRAMUSCULAR | Status: DC | PRN
  Administered 2020-11-27: 25 ug via INTRAVENOUS
  Administered 2020-11-27: 50 ug via INTRAVENOUS
  Administered 2020-11-27: 25 ug via INTRAVENOUS

## 2020-11-27 MED ORDER — ACETAMINOPHEN 500 MG PO TABS
ORAL_TABLET | ORAL | Status: AC
  Filled 2020-11-27: qty 2

## 2020-11-27 MED ORDER — OXYCODONE HCL 5 MG PO TABS: 5.0000 mg | ORAL_TABLET | Freq: Once | ORAL | Status: DC | PRN

## 2020-11-27 MED ORDER — LIDOCAINE HCL (CARDIAC) PF 100 MG/5ML IV SOSY
PREFILLED_SYRINGE | INTRAVENOUS | Status: DC | PRN
  Administered 2020-11-27: 60 mg via INTRAVENOUS

## 2020-11-27 MED ORDER — MIDAZOLAM HCL 2 MG/2ML IJ SOLN
INTRAMUSCULAR | Status: AC
  Filled 2020-11-27: qty 2

## 2020-11-27 MED ORDER — DEXMEDETOMIDINE (PRECEDEX) IN NS 20 MCG/5ML (4 MCG/ML) IV SYRINGE
PREFILLED_SYRINGE | INTRAVENOUS | Status: AC
  Filled 2020-11-27: qty 5

## 2020-11-27 MED ORDER — FENTANYL CITRATE (PF) 100 MCG/2ML IJ SOLN: 25.0000 ug | INTRAMUSCULAR | Status: DC | PRN

## 2020-11-27 MED ORDER — PROPOFOL 10 MG/ML IV BOLUS
INTRAVENOUS | Status: DC | PRN
  Administered 2020-11-27: 130 mg via INTRAVENOUS

## 2020-11-27 MED ORDER — DEXAMETHASONE SODIUM PHOSPHATE 10 MG/ML IJ SOLN
INTRAMUSCULAR | Status: AC
  Filled 2020-11-27: qty 1

## 2020-11-27 MED ORDER — ACETAMINOPHEN 160 MG/5ML PO SOLN: 1000.0000 mg | Freq: Once | ORAL | Status: AC | PRN

## 2020-11-27 MED ORDER — FENTANYL CITRATE (PF) 100 MCG/2ML IJ SOLN
INTRAMUSCULAR | Status: AC
  Filled 2020-11-27: qty 2

## 2020-11-27 MED ORDER — ROCURONIUM BROMIDE 10 MG/ML (PF) SYRINGE
PREFILLED_SYRINGE | INTRAVENOUS | Status: AC
  Filled 2020-11-27: qty 10

## 2020-11-27 MED ORDER — ONDANSETRON HCL 4 MG/2ML IJ SOLN
INTRAMUSCULAR | Status: AC
  Filled 2020-11-27: qty 2

## 2020-11-27 MED ORDER — MIDAZOLAM HCL 5 MG/5ML IJ SOLN
INTRAMUSCULAR | Status: DC | PRN
  Administered 2020-11-27: 2 mg via INTRAVENOUS

## 2020-11-27 SURGICAL SUPPLY — 27 items
ADH SKN CLS APL DERMABOND .7 (GAUZE/BANDAGES/DRESSINGS) ×1
CATH ROBINSON RED A/P 16FR (CATHETERS) ×2 IMPLANT
COVER MAYO STAND STRL (DRAPES) ×1 IMPLANT
COVER WAND RF STERILE (DRAPES) ×2 IMPLANT
DECANTER SPIKE VIAL GLASS SM (MISCELLANEOUS) ×1 IMPLANT
DERMABOND ADVANCED (GAUZE/BANDAGES/DRESSINGS) ×1
DERMABOND ADVANCED .7 DNX12 (GAUZE/BANDAGES/DRESSINGS) IMPLANT
DURAPREP 26ML APPLICATOR (WOUND CARE) ×2 IMPLANT
GLOVE BIOGEL M 6.5 STRL (GLOVE) ×2 IMPLANT
GLOVE BIOGEL PI IND STRL 7.0 (GLOVE) ×3 IMPLANT
GLOVE BIOGEL PI INDICATOR 7.0 (GLOVE) ×4
GLOVE ECLIPSE 6.5 STRL STRAW (GLOVE) ×2 IMPLANT
GLOVE SURG UNDER POLY LF SZ7 (GLOVE) ×1 IMPLANT
GOWN STRL REUS W/ TWL LRG LVL3 (GOWN DISPOSABLE) ×2 IMPLANT
GOWN STRL REUS W/TWL LRG LVL3 (GOWN DISPOSABLE) ×4
KIT TURNOVER CYSTO (KITS) ×2 IMPLANT
NEEDLE INSUFFLATION 120MM (ENDOMECHANICALS) ×2 IMPLANT
PACK LAPAROSCOPY BASIN (CUSTOM PROCEDURE TRAY) ×2 IMPLANT
PACK TRENDGUARD 450 HYBRID PRO (MISCELLANEOUS) IMPLANT
SET TUBE SMOKE EVAC HIGH FLOW (TUBING) ×2 IMPLANT
SUT VIC AB 4-0 PS2 18 (SUTURE) ×2 IMPLANT
SUT VICRYL 0 UR6 27IN ABS (SUTURE) ×2 IMPLANT
TOWEL OR 17X26 10 PK STRL BLUE (TOWEL DISPOSABLE) ×1 IMPLANT
TRENDGUARD 450 HYBRID PRO PACK (MISCELLANEOUS) ×2
TROCAR OPTI TIP 5M 100M (ENDOMECHANICALS) ×2 IMPLANT
TROCAR XCEL DIL TIP R 11M (ENDOMECHANICALS) ×2 IMPLANT
WARMER LAPAROSCOPE (MISCELLANEOUS) ×2 IMPLANT

## 2020-11-27 NOTE — Brief Op Note (Signed)
11/27/2020  2:56 PM  PATIENT:  Whitney Kent  49 y.o. female  PRE-OPERATIVE DIAGNOSIS:   desires sterilization  POST-OPERATIVE DIAGNOSIS:   desires sterilization  PROCEDURE:  laparoscopic tubal ligation with bipolar cautery  SURGEON:  Surgeon(s) and Role:    * Jemarcus Dougal, Alanda Slim, MD - Primary  PHYSICIAN ASSISTANT:   ASSISTANTS: none   ANESTHESIA:   general FINDINGS: nl tubes and ovaries, enlarged uterus with right ant SS/pedunculated fibroids, nl cul de sacs nl liver edge Omentum adherent to left ant abd wall EBL:  5 mL   BLOOD ADMINISTERED:none  DRAINS: none   LOCAL MEDICATIONS USED:  MARCAINE     SPECIMEN:  No Specimen  DISPOSITION OF SPECIMEN:  N/A  COUNTS:  YES  TOURNIQUET:  * No tourniquets in log *  DICTATION: .Other Dictation: Dictation Number [id number]  PLAN OF CARE: Discharge to home after PACU  PATIENT DISPOSITION:  PACU - hemodynamically stable.   Delay start of Pharmacological VTE agent (>24hrs) due to surgical blood loss or risk of bleeding: no

## 2020-11-27 NOTE — Discharge Instructions (Signed)
CALL  IF TEMP>100.4, NOTHING PER VAGINA X 2 WK, CALL IF SOAKING A MAXI  PAD EVERY HOUR OR MORE FREQUENTLY Warm heat to abdomen every four hours while awake x 24 hrs  Post Anesthesia Home Care Instructions  Activity: Get plenty of rest for the remainder of the day. A responsible individual must stay with you for 24 hours following the procedure.  For the next 24 hours, DO NOT: -Drive a car -Paediatric nurse -Drink alcoholic beverages -Take any medication unless instructed by your physician -Make any legal decisions or sign important papers.  Meals: Start with liquid foods such as gelatin or soup. Progress to regular foods as tolerated. Avoid greasy, spicy, heavy foods. If nausea and/or vomiting occur, drink only clear liquids until the nausea and/or vomiting subsides. Call your physician if vomiting continues.  Special Instructions/Symptoms: Your throat may feel dry or sore from the anesthesia or the breathing tube placed in your throat during surgery. If this causes discomfort, gargle with warm salt water. The discomfort should disappear within 24 hours.   No ibuprofen, Advil, Aleve, Motrin, or naproxen until after 8:15 pm today if needed.  Next dose of Tylenol after 10:15 pm today if needed.

## 2020-11-27 NOTE — Anesthesia Procedure Notes (Signed)
Procedure Name: Intubation Date/Time: 11/27/2020 1:54 PM Performed by: Justice Rocher, CRNA Pre-anesthesia Checklist: Patient identified, Emergency Drugs available, Suction available, Patient being monitored and Timeout performed Patient Re-evaluated:Patient Re-evaluated prior to induction Oxygen Delivery Method: Circle system utilized Preoxygenation: Pre-oxygenation with 100% oxygen Induction Type: IV induction Ventilation: Mask ventilation without difficulty Laryngoscope Size: Mac and 3 Grade View: Grade II Tube type: Oral Number of attempts: 1 Airway Equipment and Method: Stylet and Oral airway Placement Confirmation: ETT inserted through vocal cords under direct vision,  positive ETCO2,  breath sounds checked- equal and bilateral and CO2 detector Secured at: 22 cm Tube secured with: Tape Dental Injury: Teeth and Oropharynx as per pre-operative assessment

## 2020-11-27 NOTE — Anesthesia Preprocedure Evaluation (Signed)
Anesthesia Evaluation  Patient identified by MRN, date of birth, ID band Patient awake    Reviewed: Allergy & Precautions, NPO status , Patient's Chart, lab work & pertinent test results  History of Anesthesia Complications Negative for: history of anesthetic complications  Airway Mallampati: II  TM Distance: >3 FB Neck ROM: Full    Dental  (+) Dental Advisory Given, Teeth Intact   Pulmonary  Covid-19 Nucleic Acid Test Results Lab Results      Component                Value               Date                      [place]              NEGATIVE            11/24/2020                [place]              Not Detected        09/17/2019                [place]              NEGATIVE            07/11/2019                [place]              NEGATIVE            07/08/2019              breath sounds clear to auscultation       Cardiovascular negative cardio ROS   Rhythm:Regular     Neuro/Psych Seizures -,  PSYCHIATRIC DISORDERS Anxiety Depression CVA    GI/Hepatic Neg liver ROS, GERD  Medicated and Controlled,  Endo/Other  negative endocrine ROS  Renal/GU Lab Results      Component                Value               Date                      CREATININE               0.70                11/27/2020                Musculoskeletal negative musculoskeletal ROS (+)   Abdominal   Peds  Hematology negative hematology ROS (+) Lab Results      Component                Value               Date                      WBC                      6.3                 11/27/2020                HGB  13.4                11/27/2020                HCT                      41.9                11/27/2020                MCV                      82.3                11/27/2020                PLT                      250                 11/27/2020              Anesthesia Other Findings    Reproductive/Obstetrics                             Anesthesia Physical Anesthesia Plan  ASA: II  Anesthesia Plan: General   Post-op Pain Management:    Induction: Intravenous  PONV Risk Score and Plan: 3 and Ondansetron and Dexamethasone  Airway Management Planned: Oral ETT  Additional Equipment: None  Intra-op Plan:   Post-operative Plan: Extubation in OR  Informed Consent: I have reviewed the patients History and Physical, chart, labs and discussed the procedure including the risks, benefits and alternatives for the proposed anesthesia with the patient or authorized representative who has indicated his/her understanding and acceptance.     Dental advisory given  Plan Discussed with: CRNA and Anesthesiologist  Anesthesia Plan Comments:         Anesthesia Quick Evaluation

## 2020-11-27 NOTE — Interval H&P Note (Signed)
History and Physical Interval Note:  11/27/2020 1:21 PM  Whitney Kent  has presented today for surgery, with the diagnosis of sterilization.  The various methods of treatment have been discussed with the patient and family. After consideration of risks, benefits and other options for treatment, the patient has consented to  Procedure(s): LAPAROSCOPIC TUBAL LIGATION (Bilateral) as a surgical intervention.  The patient's history has been reviewed, patient examined, no change in status, stable for surgery.  I have reviewed the patient's chart and labs.  Questions were answered to the patient's satisfaction.     Rheanna Sergent A Tamekia Rotter

## 2020-11-27 NOTE — H&P (Signed)
Whitney Kent is an 49 y.o. female. [id number] perimenopausal female presents for surgical sterilization via laparoscopy  Pertinent Gynecological History: Menses: perimenopause Bleeding: nl flow , skipping cycles Contraception: none DES exposure: denies Blood transfusions: none Sexually transmitted diseases: no past history Previous GYN Procedures: DNC Last mammogram: normal Date: 2021 Last pap: normal Date: 2021 OB History: G3P2   Menstrual History: Menarche age: n/a Patient's last menstrual period was 10/13/2020.    Past Medical History:  Diagnosis Date  . Allergy   . Anemia   . Anxiety   . Depression   . GERD (gastroesophageal reflux disease)   . Goiter    dr Dwyane Dee follows  last [place] 2 yrs ago  . Hemiplegic migraine    pt dx 2004.  Pt had 1 seizure after this dx.  Not sure if related to dx or not. maw  . History of asthma    none since age 85  . Seizures ([place])    2004. only 1 seizure  . Stroke ([place]) 2019   mild no residual deficit found on mri  . Syncope    occ with migraine  . Vitamin D deficiency     Past Surgical History:  Procedure Laterality Date  . COLONOSCOPY  2018  . DILATATION & CURETTAGE/HYSTEROSCOPY WITH MYOSURE N/A 07/11/2019   Procedure: DILATATION & CURETTAGE/HYSTEROSCOPY WITH MYOSURE;  Surgeon: Servando Salina, MD;  Location: [facility];  Service: Gynecology;  Laterality: N/A;  . WISDOM TOOTH EXTRACTION     2009 x4 removed    Family History  Problem Relation Age of Onset  . Uterine cancer Mother   . Alcohol abuse Mother   . Arthritis Mother   . Depression Mother   . Drug abuse Mother   . Heart disease Mother   . Mental illness Mother   . Stroke Mother   . Kidney failure Father   . Alcohol abuse Father   . Diabetes Father   . Prostate cancer Father   . Colon cancer Father 57  . Kidney disease Father   . Depression Brother   . Arthritis Maternal Grandmother   . COPD Maternal Grandmother   . Heart attack  Maternal Grandmother   . Hypertension Maternal Grandmother   . Hyperlipidemia Maternal Grandmother   . Arthritis Maternal Grandfather   . Heart attack Maternal Grandfather   . Hyperlipidemia Maternal Grandfather   . Diabetes Paternal Grandfather   . Depression Daughter   . Asthma Daughter   . Depression Daughter   . Autism Daughter   . Esophageal cancer Neg Hx   . Pancreatic cancer Neg Hx   . Rectal cancer Neg Hx   . Stomach cancer Neg Hx     Social History:  reports that she has never smoked. She has never used smokeless tobacco. She reports current alcohol use. She reports that she does not use drugs.  Allergies: No Known Allergies  No medications prior to admission.    Review of Systems  All other systems reviewed and are negative.   Height 5' 2.5" (1.588 m), weight 67.6 kg, last menstrual period 10/13/2020. Physical Exam Constitutional:      Appearance: Normal appearance.  HENT:     Head: Normocephalic.  Eyes:     Extraocular Movements: Extraocular movements intact.  Cardiovascular:     Rate and Rhythm: Regular rhythm.  Pulmonary:     Breath sounds: Normal breath sounds.  Abdominal:     General: Abdomen is flat.  Genitourinary:    General:  Normal vulva.     Comments: Vagina nl Cervix parous Uterus AF nl  adnexa nl Musculoskeletal:        General: No swelling.     Cervical back: Normal range of motion and neck supple.  Skin:    General: Skin is warm and dry.  Neurological:     Mental Status: She is alert and oriented to person, place, and time.  Psychiatric:        Mood and Affect: Mood normal.     No results found for this or any previous visit (from the past 24 hour(s)).  No results found.  Assessment/Plan: Desires  Sterilization P) laparoscopic tubal ligation with bipolar cautery Procedure explained. Risk of surgery reviewed including infection, bleeding, injury to underlying organ structures, internal scar tissue, nonreversible, failure rate  1/500-1/600, permanent. All ? answered  Roper Tolson A Cameran Ahmed 11/27/2020, 4:24 AM

## 2020-11-27 NOTE — Transfer of Care (Signed)
Immediate Anesthesia Transfer of Care Note  Patient: Whitney Kent  Procedure(s) Performed: Procedure(s) (LRB): LAPAROSCOPIC TUBAL LIGATION with BIPOLAR CAUTERY (Bilateral)  Patient Location: PACU  Anesthesia Type: General  Level of Consciousness: awake, sedated, patient cooperative and responds to stimulation  Airway & Oxygen Therapy: Patient Spontanous Breathing and Patient connected to NC02 and soft FM   Post-op Assessment: Report given to PACU RN, Post -op Vital signs reviewed and stable and Patient moving all extremities  Post vital signs: Reviewed and stable  Complications: No apparent anesthesia complications

## 2020-11-28 NOTE — Op Note (Signed)
Whitney Kent, [facility] MEDICAL RECORD DQ:[id number] ACCOUNT [id number] DATE OF BIRTH:[birth year] FACILITY: [facility] LOCATION: [facility] PHYSICIAN:Tacarra Justo A. Shardae Kleinman, MD  OPERATIVE REPORT  DATE OF PROCEDURE:  11/27/2020  PREOPERATIVE DIAGNOSIS:  Desires sterilization.  PROCEDURE:  Laparoscopic tubal ligation with bipolar cautery.  POSTOPERATIVE DIAGNOSIS:  Desires sterilization.  ANESTHESIA:  General.  SURGEON:  Servando Salina, MD  ASSISTANT:  None.  DESCRIPTION OF PROCEDURE:  The patient was placed in the dorsal lithotomy position.  She was sterilely prepped and draped in usual fashion.  Prior to being prepped, bimanual examination revealed an anteverted uterus.  No adnexal masses could be  appreciated.  Bivalve speculum was placed in the vagina.  Single tooth tenaculum was placed on the anterior lip of the cervix.  An acorn cannula was introduced into the cervical os and attached to the tenaculum for manipulation of the uterus.  The  bladder was catheterized for a moderate amount of urine.  Attention was then turned to the abdomen, which had been prepped.  0.25% Marcaine was injected infraumbilically.  A small infraumbilical vertical incision was then made.  A Veress needle was  introduced and tested with normal saline.  Opening pressure of 4 was noted.  2.8 liters of CO2 was insufflated.  Veress needle was then removed.  A 10 mm disposable trocar was introduced in the abdomen without incident.  A lighted video laparoscope was then inserted, confirming entry into the abdomen without incident.  Panoramic inspection was done.  The patient was placed in Trendelenburg position.  Suprapubic incision was made after 0.25% Marcaine was injected.  Small incision was made and under  direct visualization, a 5 mm port was placed.  The pelvis was then inspected with the probe.  Pedunculated small fibroid was noted on the left anterior aspect of the serosal surface of the uterus.  The  posterior and anterior cul-de-sac was otherwise unremarkable.  Both tubes were normal.  Both ovaries were normal.  The omentum was partially adherent to the anterior abdominal wall.  The normal liver edge was noted.  Using a bipolar cautery, the midportion of both fallopian tubes were cauterized.   When the cauterization was felt to be adequate, the suprapubic port site was removed under direct visualization.  The abdomen was deflated and the infraumbilical port site was removed, taking care not to bring up any underlying structures.  Once the abdomen was then deflated, the fascia was identified, 0 Vicryl figure-of-eight suture x2 was placed.  The skin was approximated with 4-0 Vicryl subcuticular closures and the instruments in the vagina was removed.  SPECIMEN:  None.  ESTIMATED BLOOD LOSS:  Minimal.  INTRAOPERATIVE FLUIDS:  600 mL.  URINE OUTPUT:  100 mL.  COMPLICATIONS:  None.  DISPOSITION:  The patient tolerated the procedure well and was transferred to recovery room in stable condition.  HN/NUANCE  D:11/27/2020 T:11/28/2020 JOB:[id number]

## 2020-11-30 ENCOUNTER — Encounter (HOSPITAL_BASED_OUTPATIENT_CLINIC_OR_DEPARTMENT_OTHER): Payer: Self-pay | Admitting: Obstetrics and Gynecology

## 2020-12-03 NOTE — Anesthesia Postprocedure Evaluation (Signed)
Anesthesia Post Note  Patient: Whitney Kent  Procedure(s) Performed: LAPAROSCOPIC TUBAL LIGATION with BIPOLAR CAUTERY (Bilateral Abdomen)     Patient location during evaluation: PACU Anesthesia Type: General Level of consciousness: awake and alert Pain management: pain level controlled Vital Signs Assessment: post-procedure vital signs reviewed and stable Respiratory status: spontaneous breathing, nonlabored ventilation, respiratory function stable and patient connected to nasal cannula oxygen Cardiovascular status: blood pressure returned to baseline and stable Postop Assessment: no apparent nausea or vomiting Anesthetic complications: no   No complications documented.  Last Vitals:  Vitals:   11/27/20 1515 11/27/20 1630  BP: 107/78 114/70  Pulse: 73 67  Resp: 14 15  Temp:  36.4 C  SpO2: 97% 100%    Last Pain:  Vitals:   11/30/20 1022  TempSrc:   PainSc: 4                  Valkyrie Guardiola

## 2021-01-13 ENCOUNTER — Telehealth: Payer: Self-pay | Admitting: Endocrinology

## 2021-01-13 NOTE — Telephone Encounter (Signed)
Pt called to see if Dr Dwyane Dee recommends she make a lab appt to check her levels. She said it has been a while and at her last gynocology appt they recommended she get in touch with her endocrinologist.  Ph# [phone]

## 2021-01-14 NOTE — Telephone Encounter (Signed)
Please see below.

## 2021-01-14 NOTE — Telephone Encounter (Signed)
Could you please call this patient and schedule her for  labs and an office visit per Dr. Dwyane Dee please

## 2021-01-14 NOTE — Telephone Encounter (Signed)
She needs to be scheduled for thyroid labs and office visit

## 2021-01-15 ENCOUNTER — Ambulatory Visit (INDEPENDENT_AMBULATORY_CARE_PROVIDER_SITE_OTHER): Payer: PRIVATE HEALTH INSURANCE

## 2021-01-15 ENCOUNTER — Other Ambulatory Visit: Payer: Self-pay

## 2021-01-15 DIAGNOSIS — E538 Deficiency of other specified B group vitamins: Secondary | ICD-10-CM | POA: Diagnosis not present

## 2021-01-15 MED ORDER — CYANOCOBALAMIN 1000 MCG/ML IJ SOLN
1000.0000 ug | Freq: Once | INTRAMUSCULAR | Status: AC
  Administered 2021-01-15: 1000 ug via INTRAMUSCULAR

## 2021-01-15 NOTE — Progress Notes (Signed)
Per orders of Dr. Volanda Napoleon, injection of Cyanocobalamin 1000 mcg/mL given by Wyvonne Lenz. Patient tolerated injection well.

## 2021-01-19 ENCOUNTER — Other Ambulatory Visit: Payer: Self-pay | Admitting: Endocrinology

## 2021-01-19 DIAGNOSIS — E049 Nontoxic goiter, unspecified: Secondary | ICD-10-CM

## 2021-01-22 ENCOUNTER — Other Ambulatory Visit: Payer: PRIVATE HEALTH INSURANCE

## 2021-01-27 ENCOUNTER — Other Ambulatory Visit: Payer: PRIVATE HEALTH INSURANCE

## 2021-01-27 ENCOUNTER — Other Ambulatory Visit: Payer: Self-pay

## 2021-01-27 ENCOUNTER — Other Ambulatory Visit (INDEPENDENT_AMBULATORY_CARE_PROVIDER_SITE_OTHER): Payer: PRIVATE HEALTH INSURANCE

## 2021-01-27 DIAGNOSIS — E049 Nontoxic goiter, unspecified: Secondary | ICD-10-CM

## 2021-01-27 LAB — TSH: TSH: 0.7 u[IU]/mL (ref 0.35–4.50)

## 2021-01-27 LAB — T4, FREE: Free T4: 0.94 ng/dL (ref 0.60–1.60)

## 2021-01-28 ENCOUNTER — Encounter: Payer: Self-pay | Admitting: Endocrinology

## 2021-01-28 ENCOUNTER — Ambulatory Visit: Payer: PRIVATE HEALTH INSURANCE | Admitting: Endocrinology

## 2021-01-28 VITALS — BP 106/70 | HR 91 | Ht 62.5 in | Wt 145.0 lb

## 2021-01-28 DIAGNOSIS — E049 Nontoxic goiter, unspecified: Secondary | ICD-10-CM

## 2021-01-28 NOTE — Progress Notes (Signed)
Patient ID: Whitney Kent, female   DOB: 1971/03/31, 50 y.o.   MRN: [id number]          Reason for Appointment: Goiter, follow-up visit    History of Present Illness:   The patient's thyroid enlargement was first discovered in the year 2002 reportedly At that time she was told by she had a goiter She thinks she had small nodules on initial evaluation but no intervention was needed She has never been on thyroid supplementation  She was also periodically followed by another endocrinologist previously  She was last seen in 2019 She was told by her anesthesiologist to do a follow-up visit She does not think she has any choking sensation although she feels that her swallowing is a little different She does feel some swelling on the right side of the night but not much different than before  Lab results as follows:  Lab Results  Component Value Date   FREET4 0.94 01/27/2021   FREET4 1.2 07/24/2020   FREET4 0.89 09/25/2017   TSH 0.70 01/27/2021   TSH 0.69 07/24/2020   TSH 0.74 09/25/2017    She has had an ultrasound exam in 2019 which showed mostly a right-sided thyroid enlargement and no significant nodules    Allergies as of 01/28/2021   No Known Allergies     Medication List       Accurate as of January 28, 2021  8:51 AM. If you have any questions, ask your nurse or doctor.        aspirin EC 81 MG tablet Take 81 mg by mouth daily.   bisacodyl 5 MG EC tablet Commonly known as: DULCOLAX Take 10 mg by mouth daily as needed for moderate constipation.   butalbital-acetaminophen-caffeine 50-325-40 MG tablet Commonly known as: FIORICET Take 1 tablet by mouth every 6 (six) hours as needed for headache.   cetirizine 10 MG tablet Commonly known as: ZYRTEC Take 10 mg by mouth as needed. OTC   Flintstones Complete 10 MG Chew Chew by mouth daily.   ibuprofen 800 MG tablet Commonly known as: ADVIL Take 1 tablet (800 mg total) by mouth every 8 (eight) hours as  needed.   omeprazole 20 MG capsule Commonly known as: PRILOSEC Take 1 capsule (20 mg total) by mouth daily. What changed:   when to take this  reasons to take this   Cleona Name: vitamin b 12 injection q 30 days   vitamin C 100 MG tablet Take 100 mg by mouth daily.   Vitamin D3 75 MCG (3000 UT) Tabs Take by mouth daily.       Allergies: No Known Allergies  Past Medical History:  Diagnosis Date  . Allergy   . Anemia   . Anxiety   . Depression   . GERD (gastroesophageal reflux disease)   . Goiter    dr Dwyane Dee follows  last [place] 2 yrs ago  . Hemiplegic migraine    pt dx 2004.  Pt had 1 seizure after this dx.  Not sure if related to dx or not. maw  . History of asthma    none since age 53  . Seizures ([place])    2004. only 1 seizure  . Stroke ([place]) 2019   mild no residual deficit found on mri  . Syncope    occ with migraine  . Vitamin D deficiency     Past Surgical History:  Procedure Laterality Date  . COLONOSCOPY  2018  . DILATATION & CURETTAGE/HYSTEROSCOPY  WITH MYOSURE N/A 07/11/2019   Procedure: DILATATION & CURETTAGE/HYSTEROSCOPY WITH MYOSURE;  Surgeon: Servando Salina, MD;  Location: [place] [place];  Service: Gynecology;  Laterality: N/A;  . LAPAROSCOPIC TUBAL LIGATION Bilateral 11/27/2020   Procedure: LAPAROSCOPIC TUBAL LIGATION with BIPOLAR CAUTERY;  Surgeon: Servando Salina, MD;  Location: [place];  Service: Gynecology;  Laterality: Bilateral;  . WISDOM TOOTH EXTRACTION     2009 x4 removed    Family History  Problem Relation Age of Onset  . Uterine cancer Mother   . Alcohol abuse Mother   . Arthritis Mother   . Depression Mother   . Drug abuse Mother   . Heart disease Mother   . Mental illness Mother   . Stroke Mother   . Kidney failure Father   . Alcohol abuse Father   . Diabetes Father   . Prostate cancer Father   . Colon cancer Father 74  . Kidney disease Father   . Depression Brother   .  Arthritis Maternal Grandmother   . COPD Maternal Grandmother   . Heart attack Maternal Grandmother   . Hypertension Maternal Grandmother   . Hyperlipidemia Maternal Grandmother   . Arthritis Maternal Grandfather   . Heart attack Maternal Grandfather   . Hyperlipidemia Maternal Grandfather   . Diabetes Paternal Grandfather   . Depression Daughter   . Asthma Daughter   . Depression Daughter   . Autism Daughter   . Esophageal cancer Neg Hx   . Pancreatic cancer Neg Hx   . Rectal cancer Neg Hx   . Stomach cancer Neg Hx     Social History:  reports that she has never smoked. She has never used smokeless tobacco. She reports current alcohol use. She reports that she does not use drugs.     Review of Systems    Examination:   BP 106/70 (BP Location: Right Arm, Patient Position: Sitting, Cuff Size: Normal)   Pulse 91   Ht 5' 2.5" (1.588 m)   Wt 145 lb (65.8 kg)   SpO2 97%   BMI 26.10 kg/m     Thyroid enlargement: This is mostly right-sided and about 2-1/2 times normal No distinct nodules are felt but the texture is firm Neck circumference is 33.5 cm over the mid thyroid Left side is not palpable but isthmus is prominent There is no deviation of the trachea There is no stridor There is no lymphadenopathy in the neck    Assessment/Plan:  GOITER:  Long-standing symptom goiter with normal thyroid functions Her thyroid enlargement appears to be about the same as before; no nodules felt She previously had no nodule formation in the ultrasounds However it appears to be firmer in texture Continues to be euthyroid  Plan: Check TPO antibody for diagnostic purposes, she currently does not have any significant family history of thyroid disease  Unless she is noticing new symptoms she can come back every 2 years   Elayne Snare 01/28/2021

## 2021-01-29 LAB — THYROID PEROXIDASE ANTIBODY: Thyroperoxidase Ab SerPl-aCnc: 12 IU/mL (ref 0–34)

## 2021-02-11 ENCOUNTER — Encounter: Payer: Self-pay | Admitting: Family Medicine

## 2021-02-16 NOTE — Progress Notes (Signed)
Subjective:   I, Whitney Kent, LAT, ATC acting as a scribe for Whitney Leader, MD.  I'm seeing this patient as a consultation for Dr. Grier Mitts. Note will be routed back to referring provider/PCP.  CC: Right shoulder pain  HPI: Pt is a 50 y/o female c/o R shoulder pain ongoing for over a month w/ no certain MOI. Pt reports when camping, she was laying on R side on the air mattress and when trying to roll over she remembers feeling some pain. Pt locates shoulder pain to the lateral aspect of R shoulder. Pt reports she is not in extreme pain, but knows something about shoulder does not feel right.  Radiates: no Shoulder mechanical symptoms: yes UE Numbness/tingling: no UE Weakness: no- but is guarding Aggravates: shoulder ext and ER, sleeping, Treatments tried: rest  Past medical history, Surgical history, Family history, Social history, Allergies, and medications have been entered into the medical record, reviewed.   Review of Systems: No new headache, visual changes, nausea, vomiting, diarrhea, constipation, dizziness, abdominal pain, skin rash, fevers, chills, night sweats, weight loss, swollen lymph nodes, body aches, joint swelling, muscle aches, chest pain, shortness of breath, mood changes, visual or auditory hallucinations.   Objective:    Vitals:   02/17/21 1343  BP: 98/66  Pulse: 88  SpO2: 98%   General: Well Developed, well nourished, and in no acute distress.  Neuro/Psych: Alert and oriented x3, extra-ocular muscles intact, able to move all 4 extremities, sensation grossly intact. Skin: Warm and dry, no rashes noted.  Respiratory: Not using accessory muscles, speaking in full sentences, trachea midline.  Cardiovascular: Pulses palpable, no extremity edema. Abdomen: Does not appear distended. MSK: Right shoulder normal. Nontender. Full range of motion abduction external and internal rotation. Intact strength abduction external/internal rotation. Positive Hawkins  and Neer's test.  Positive indican test. Positive crossover arm compression test. Negative Yergason's and speeds test.  Lab and Radiology Results  Diagnostic Limited MSK Ultrasound of: Right shoulder Biceps tendon intact normal. Subscapularis tendon is intact Supraspinatus tendon is intact. Moderate subacromial bursitis present. Infraspinatus tendon is intact. AC joint degenerative with effusion. Impression: Subacromial bursitis.  Mild AC DJD.   Impression and Recommendations:    Assessment and Plan: 50 y.o. female with right shoulder pain ongoing for about a month.  Pain thought to be due to subacromial bursitis.  Plan for physical therapy and home exercise program.  Recheck back in about 6 weeks.  Return sooner if needed.Marland Kitchen  PDMP not reviewed this encounter. Orders Placed This Encounter  Procedures  . [place] LIMITED JOINT SPACE STRUCTURES UP RIGHT(NO LINKED CHARGES)    Standing Status:   Future    Number of Occurrences:   1    Standing Expiration Date:   08/20/2021    Order Specific Question:   Reason for Exam (SYMPTOM  OR DIAGNOSIS REQUIRED)    Answer:   right shoulder pain    Order Specific Question:   Preferred imaging location?    Answer:   [facility] [facility]  . Ambulatory referral to Physical Therapy    Referral Priority:   Routine    Referral Type:   Physical Medicine    Referral Reason:   Specialty Services Required    Requested Specialty:   Physical Therapy   No orders of the defined types were placed in this encounter.   Discussed warning signs or symptoms. Please see discharge instructions. Patient expresses understanding.   The above documentation has been reviewed and is  accurate and complete Whitney Kent, M.D.

## 2021-02-17 ENCOUNTER — Ambulatory Visit: Payer: PRIVATE HEALTH INSURANCE | Admitting: Family Medicine

## 2021-02-17 ENCOUNTER — Ambulatory Visit: Payer: Self-pay

## 2021-02-17 ENCOUNTER — Other Ambulatory Visit: Payer: Self-pay

## 2021-02-17 ENCOUNTER — Encounter: Payer: Self-pay | Admitting: Family Medicine

## 2021-02-17 VITALS — BP 98/66 | HR 88 | Ht 62.5 in | Wt 149.0 lb

## 2021-02-17 DIAGNOSIS — M25511 Pain in right shoulder: Secondary | ICD-10-CM | POA: Diagnosis not present

## 2021-02-17 NOTE — Patient Instructions (Addendum)
Thank you for coming in today.  I've referred you to Physical Therapy.  Let us know if you don't hear from them in one week.  Recheck in 6 weeks.    Shoulder Impingement Syndrome  Shoulder impingement syndrome is a condition that causes pain when connective tissues (tendons) surrounding the shoulder joint become pinched. These tendons are part of the group of muscles and tissues that help to stabilize the shoulder (rotator cuff). Beneath the rotator cuff is a fluid-filled sac (bursa) that allows the muscles and tendons to glide smoothly. The bursa may become swollen or irritated (bursitis). Bursitis, swelling in the rotator cuff tendons, or both conditions can decrease how much space is under a bone in the shoulder joint (acromion), resulting in impingement. What are the causes? Shoulder impingement syndrome may be caused by bursitis or swelling of the rotator cuff tendons, which may result from:  Repetitive overhead arm movements.  Falling onto the shoulder.  Weakness in the shoulder muscles. What increases the risk? You may be more likely to develop this condition if you:  Play sports that involve throwing, such as baseball.  Participate in sports such as tennis, volleyball, and swimming.  Work as a Curator, Games developer, or Architect. Some people are also more likely to develop impingement syndrome because of the shape of their acromion bone. What are the signs or symptoms? The main symptom of this condition is pain on the front or side of the shoulder. The pain may:  Get worse when lifting or raising the arm.  Get worse at night.  Wake you up from sleeping.  Feel sharp when the shoulder is moved and then fade to an ache. Other symptoms may include:  Tenderness.  Stiffness.  Inability to raise the arm above shoulder level or behind the body.  Weakness. How is this diagnosed? This condition may be diagnosed based on:  Your symptoms and medical history.  A  physical exam.  Imaging tests, such as: ? X-rays. ? MRI. ? Ultrasound. How is this treated? This condition may be treated by:  Resting your shoulder and avoiding all activities that cause pain or put stress on the shoulder.  Icing your shoulder.  NSAIDs to help reduce pain and swelling.  One or more injections of medicines to numb the area and reduce inflammation.  Physical therapy.  Surgery. This may be needed if nonsurgical treatments have not helped. Surgery may involve repairing the rotator cuff, reshaping the acromion, or removing the bursa. Follow these instructions at home: Managing pain, stiffness, and swelling  If directed, put ice on the injured area. ? Put ice in a plastic bag. ? Place a towel between your skin and the bag. ? Leave the ice on for 20 minutes, 2-3 times a day.   Activity  Rest and return to your normal activities as told by your health care provider. Ask your health care provider what activities are safe for you.  Do exercises as told by your health care provider. General instructions  Do not use any products that contain nicotine or tobacco, such as cigarettes, e-cigarettes, and chewing tobacco. These can delay healing. If you need help quitting, ask your health care provider.  Ask your health care provider when it is safe for you to drive.  Take over-the-counter and prescription medicines only as told by your health care provider.  Keep all follow-up visits as told by your health care provider. This is important. How is this prevented?  Give your body time to  rest between periods of activity.  Be safe and responsible while being active. This will help you avoid falls.  Maintain physical fitness, including strength and flexibility. Contact a health care provider if:  Your symptoms have not improved after 1-2 months of treatment and rest.  You cannot lift your arm away from your body. Summary  Shoulder impingement syndrome is a  condition that causes pain when connective tissues (tendons) surrounding the shoulder joint become pinched.  The main symptom of this condition is pain on the front or side of the shoulder.  This condition is usually treated with rest, ice, and pain medicines as needed. This information is not intended to replace advice given to you by your health care provider. Make sure you discuss any questions you have with your health care provider. Document Revised: 03/29/2019 Document Reviewed: 05/30/2018 Elsevier Patient Education  2021 [employer].

## 2021-02-19 ENCOUNTER — Ambulatory Visit: Payer: PRIVATE HEALTH INSURANCE

## 2021-03-02 ENCOUNTER — Ambulatory Visit: Payer: 59 | Attending: Family Medicine | Admitting: Physical Therapy

## 2021-03-02 ENCOUNTER — Encounter: Payer: Self-pay | Admitting: Physical Therapy

## 2021-03-02 ENCOUNTER — Other Ambulatory Visit: Payer: Self-pay

## 2021-03-02 DIAGNOSIS — Z789 Other specified health status: Secondary | ICD-10-CM | POA: Insufficient documentation

## 2021-03-02 DIAGNOSIS — M25611 Stiffness of right shoulder, not elsewhere classified: Secondary | ICD-10-CM | POA: Insufficient documentation

## 2021-03-02 DIAGNOSIS — R252 Cramp and spasm: Secondary | ICD-10-CM | POA: Diagnosis present

## 2021-03-02 DIAGNOSIS — M25511 Pain in right shoulder: Secondary | ICD-10-CM | POA: Diagnosis present

## 2021-03-02 NOTE — Addendum Note (Signed)
Addended by: Abelino Derrick on: 03/02/2021 04:58 PM   Modules accepted: Orders

## 2021-03-02 NOTE — Therapy (Addendum)
[facility] [address], [place], [place], [address] Phone: [phone]   Fax:  [phone]  Physical Therapy Evaluation  Patient Details  Name: Whitney Kent MRN: [id number] Date of Birth: November 07, 1971 Referring Provider (PT): Lynne Leader, MD   Encounter Date: 03/02/2021   PT End of Session - 03/02/21 0811     Visit Number 1    Date for PT Re-Evaluation 04/13/21    PT Start Time 0964    PT Stop Time 0852    PT Time Calculation (min) 41 min             Past Medical History:  Diagnosis Date   Allergy    Anemia    Anxiety    Depression    GERD (gastroesophageal reflux disease)    Goiter    dr Dwyane Dee follows  last [place] 2 yrs ago   Hemiplegic migraine    pt dx 2004.  Pt had 1 seizure after this dx.  Not sure if related to dx or not. maw   History of asthma    none since age 28   Seizures ([place])    2004. only 1 seizure   Stroke ([place]) 2019   mild no residual deficit found on mri   Syncope    occ with migraine   Vitamin D deficiency     Past Surgical History:  Procedure Laterality Date   COLONOSCOPY  2018   DILATATION & CURETTAGE/HYSTEROSCOPY WITH MYOSURE N/A 07/11/2019   Procedure: DILATATION & CURETTAGE/HYSTEROSCOPY WITH MYOSURE;  Surgeon: Servando Salina, MD;  Location: [place];  Service: Gynecology;  Laterality: N/A;   LAPAROSCOPIC TUBAL LIGATION Bilateral 11/27/2020   Procedure: LAPAROSCOPIC TUBAL LIGATION with BIPOLAR CAUTERY;  Surgeon: Servando Salina, MD;  Location: [place];  Service: Gynecology;  Laterality: Bilateral;   WISDOM TOOTH EXTRACTION     2009 x4 removed    There were no vitals filed for this visit.    Subjective Assessment - 03/02/21 0814     Subjective Patient reporting to therapy due to >1 month history of Rt shoulder pain. Patient reports no known injury. Notes that pain is intermittent but is occuring more frequently. Had  hoped that the issue would resolve on its own but has not and patient feels that it may be worsening.    Pertinent History stroke, seizures    Limitations Lifting;House hold activities    How long can you sit comfortably? unlimited    How long can you stand comfortably? unlimited    How long can you walk comfortably? unlimited    Patient Stated Goals to resolve this issue; to carry backpack without pain    Currently in Pain? Yes    Pain Score 1     Pain Location Shoulder    Pain Orientation Right    Pain Descriptors / Indicators Radiating;Dull;Sharp    Pain Type Acute pain    Pain Radiating Towards distal UE; medially into pect    Pain Onset More than a month ago    Pain Frequency Intermittent    Aggravating Factors  sleeping on R side; reaching overhead; reaching behind back; carrying heavy groceries    Pain Relieving Factors rest    Effect of Pain on Daily Activities reaching ADLs    Multiple Pain Sites No                [facility] PT Assessment - 03/02/21 0001       Assessment  Medical Diagnosis Rt shoulder pain    Referring Provider (PT) Lynne Leader, MD    Onset Date/Surgical Date --   ~1 month   Hand Dominance Right    Prior Therapy No      Precautions   Precautions None      Balance Screen   Has the patient fallen in the past 6 months No    Has the patient had a decrease in activity level because of a fear of falling?  No    Is the patient reluctant to leave their home because of a fear of falling?  No      Prior Function   Level of Independence Independent    Vocation Student      Observation/Other Assessments   Observations increased forward head posture with thoracic kyphosis and bil shoulder elevation    Focus on Therapeutic Outcomes (FOTO)  46      Sensation   Additional Comments No numbness or tingling reported      AROM   AROM Assessment Site Shoulder    Right/Left Shoulder Right    Right Shoulder Flexion 145 Degrees   pain   Right Shoulder  ABduction 142 Degrees   pain   Right Shoulder Internal Rotation --   to L1   Right Shoulder External Rotation --   to T1; pain     Strength   Right/Left Shoulder Right    Right Shoulder Flexion 5/5    Right Shoulder ABduction 5/5    Right Shoulder Internal Rotation 4/5    Right Shoulder External Rotation 4-/5    Right/Left Elbow Right    Right Elbow Flexion 5/5    Right Elbow Extension 5/5      Palpation   Palpation comment supraspinatus, AC joint, middle deltoid, pec minor tenderness with palpation      Hawkins-Kennedy test   Findings Positive    Side Right      Empty Can test   Findings Positive    Side Right      Full Can test   Findings Negative    Side Right                        Objective measurements completed on examination: See above findings.       [facility] Adult PT Treatment/Exercise - 03/02/21 0001       Exercises   Exercises Neck;Shoulder;Elbow      Shoulder Exercises: Standing   Extension Strengthening;Right;Left   8 reps   Theraband Level (Shoulder Extension) Level 2 (Red)    Extension Limitations cuing for scapular depression and retraction    Row Strengthening;Both;10 reps;Theraband    Theraband Level (Shoulder Row) Level 2 (Red)    Row Limitations increased pain initially; cuing for low row position      Shoulder Exercises: Stretch   Other Shoulder Stretches corner pec stretch; low position; x20s                    PT Education - 03/02/21 0855     Education Details Access [id number]; use of ice instead of heat for inflammation and pain management    Person(s) Educated Patient    Methods Explanation;Demonstration;Tactile cues    Comprehension Verbalized understanding;Returned demonstration;Verbal cues required              PT Short Term Goals - 03/02/21 0907       PT SHORT TERM GOAL #1   Title Patient will  demonstrate full flexion and abduction ROM of Rt shoulder without pain to more readily complete  overhead reaching    Time 3    Period Weeks    Status New    Target Date 03/23/21      PT SHORT TERM GOAL #2   Title Patient will demonstrate at least 4+/5 ER and IR strength for improved manipulation of objects for self-care    Time 3    Period Weeks    Status New    Target Date 03/23/21      PT SHORT TERM GOAL #3   Title Patient will report no more than 2 instances of pain while sleeping to improve sleep hygiene    Time 3    Period Weeks    Status New    Target Date 03/23/21               PT Long Term Goals - 03/02/21 0909       PT LONG TERM GOAL #1   Title Patient will improve FOTO score to 66 or more to indicate improved functional use of Rt UE    Baseline 46    Time 6    Period Weeks    Status New    Target Date 04/13/21      PT LONG TERM GOAL #2   Title Patient will reach behind back and behind head with 0/10 pain to more readily complete self-care and grooming tasks    Time 6    Period Weeks    Status New    Target Date 04/13/21      PT LONG TERM GOAL #3   Title Patient will raise at least 4# from waist to shoulder level with no more than 1/10 pain to indicate improved readiness for reaching ADLs.    Time 6    Period Weeks    Status New    Target Date 04/13/21                    Plan - 03/02/21 0857     Clinical Impression Statement Patient is a 50 y/o female referred due to R shoulder pain. PMH includes Hx of stroke and seizure. Patient reports ADL limitations continue to progress as she is having increased pain with reaching and carrying tasks at home and in the community. Resting posture includes forward head, increased thoracic kyphosis, and elevated shoulders. Patient reports pain with AROM and exhibits impairments in all directions. Provocative testing consistent with diagnosis of impingement. Noting impaired shoulder strength with largest deficit being rhomboid strength. Patient requires verbal and tactile cuing for proper recruitment of  scapular retractors. Reporting increased pain with row exercise but reporting decreased pain when activity changed to low row. Would benefit from continued skilled intervention to address impairments for decreased pain and to more readily complete ADLs.    Personal Factors and Comorbidities Other;Comorbidity 2    Comorbidities stroke, seizure    Examination-Activity Limitations Carry;Reach Overhead;Dressing;Hygiene/Grooming;Lift;Sleep    Examination-Participation Restrictions Cleaning;Meal Prep;Shop    Stability/Clinical Decision Making Stable/Uncomplicated    Clinical Decision Making Low    Rehab Potential Excellent    PT Frequency 2x / week    PT Duration 6 weeks    PT Treatment/Interventions Cryotherapy;Electrical Stimulation;Iontophoresis 52m/ml Dexamethasone;Therapeutic activities;Therapeutic exercise;Neuromuscular re-education;Patient/family education;Manual techniques;Passive range of motion;Dry needling;Taping    PT Next Visit Plan review HEP; continue scapular stability and postural training; ionto if patient agrees    PT Home Exercise Plan Access [email or web address]  Consulted and Agree with Plan of Care Patient             Patient will benefit from skilled therapeutic intervention in order to improve the following deficits and impairments:  Decreased activity tolerance,Decreased endurance,Decreased mobility,Decreased range of motion,Decreased strength,Increased muscle spasms,Impaired perceived functional ability,Impaired UE functional use,Improper body mechanics,Postural dysfunction  Visit Diagnosis: Acute pain of right shoulder  Decreased range of motion of right shoulder  Deficit in activities of daily living (ADL)  Cramp and spasm     Problem List Patient Active Problem List   Diagnosis Date Noted   Goiter 04/23/2018   Hemiplegic migraine 02/05/2018   PHYSICAL THERAPY DISCHARGE SUMMARY  Visits from Start of Care: 3  Current functional level related to goals /  functional outcomes: See above   Remaining deficits: See above   Education / Equipment: See above   Patient agrees to discharge. Patient goals were not met. Patient is being discharged due to not returning since the last visit.   Everardo All PT, DPT  03/02/21 12:00 PM  [facility] [address] [place], [place], [address] Phone: [phone]   Fax:  [phone]  Name: Whitney Kent MRN: [id number] Date of Birth: [birth year]

## 2021-03-03 ENCOUNTER — Encounter: Payer: Self-pay | Admitting: Physical Therapy

## 2021-03-03 ENCOUNTER — Other Ambulatory Visit: Payer: Self-pay

## 2021-03-03 ENCOUNTER — Ambulatory Visit: Payer: 59 | Admitting: Physical Therapy

## 2021-03-03 DIAGNOSIS — R252 Cramp and spasm: Secondary | ICD-10-CM

## 2021-03-03 DIAGNOSIS — M25611 Stiffness of right shoulder, not elsewhere classified: Secondary | ICD-10-CM

## 2021-03-03 DIAGNOSIS — Z789 Other specified health status: Secondary | ICD-10-CM

## 2021-03-03 DIAGNOSIS — M25511 Pain in right shoulder: Secondary | ICD-10-CM | POA: Diagnosis not present

## 2021-03-03 NOTE — Therapy (Signed)
[facility] [address] [place], [place], [address] Phone: [phone]   Fax:  [phone]  Physical Therapy Treatment  Patient Details  Name: Whitney Kent MRN: [id number] Date of Birth: [birth year] Referring Provider (PT): Lynne Leader, MD   Encounter Date: 03/03/2021   PT End of Session - 03/03/21 3976    Visit Number 2    Number of Visits 12    Date for PT Re-Evaluation 04/13/21    PT Start Time 1240    PT Stop Time 1313    PT Time Calculation (min) 33 min           Past Medical History:  Diagnosis Date  . Allergy   . Anemia   . Anxiety   . Depression   . GERD (gastroesophageal reflux disease)   . Goiter    dr Dwyane Dee follows  last [place] 2 yrs ago  . Hemiplegic migraine    pt dx 2004.  Pt had 1 seizure after this dx.  Not sure if related to dx or not. maw  . History of asthma    none since age 49  . Seizures ([place])    2004. only 1 seizure  . Stroke ([place]) 2019   mild no residual deficit found on mri  . Syncope    occ with migraine  . Vitamin D deficiency     Past Surgical History:  Procedure Laterality Date  . COLONOSCOPY  2018  . DILATATION & CURETTAGE/HYSTEROSCOPY WITH MYOSURE N/A 07/11/2019   Procedure: DILATATION & CURETTAGE/HYSTEROSCOPY WITH MYOSURE;  Surgeon: Servando Salina, MD;  Location: [place];  Service: Gynecology;  Laterality: N/A;  . LAPAROSCOPIC TUBAL LIGATION Bilateral 11/27/2020   Procedure: LAPAROSCOPIC TUBAL LIGATION with BIPOLAR CAUTERY;  Surgeon: Servando Salina, MD;  Location: [place];  Service: Gynecology;  Laterality: Bilateral;  . WISDOM TOOTH EXTRACTION     2009 x4 removed    There were no vitals filed for this visit.   Subjective Assessment - 03/03/21 1242    Subjective Patient reports that Rt shoulder is not happy.    Pertinent History stroke, seizures    Limitations Lifting;House hold activities    How long can  you sit comfortably? unlimited    How long can you stand comfortably? unlimited    How long can you walk comfortably? unlimited    Patient Stated Goals to resolve this issue; to carry backpack without pain    Currently in Pain? Yes    Pain Score 4     Pain Location Shoulder    Pain Orientation Right    Multiple Pain Sites No                             [facility] Adult PT Treatment/Exercise - 03/03/21 0001      Shoulder Exercises: Supine   Other Supine Exercises wand flexion x10    Other Supine Exercises wand chest press x10      Shoulder Exercises: Prone   Extension Strengthening;Right;10 reps      Shoulder Exercises: Sidelying   External Rotation AROM;Right;Limitations    External Rotation Limitations pain    ABduction AROM;Right;10 reps    ABduction Limitations 2x5 reps; manual scapular stabilization provided by therapist      Shoulder Exercises: Standing   Other Standing Exercises flexion isometrics with bent elbow x 15    Other Standing Exercises abduction isometric with bent elbow x10  Modalities   Modalities Iontophoresis      Iontophoresis   Type of Iontophoresis Dexamethasone    Location Rt shoulder over supraspinatus    Dose 24mL    Time 5 min                  PT Education - 03/03/21 1632    Education Details iontophoresis contraindications, methods, adverse events    Person(s) Educated Patient    Methods Explanation;Handout    Comprehension Verbalized understanding            PT Short Term Goals - 03/02/21 0907      PT SHORT TERM GOAL #1   Title Patient will demonstrate full flexion and abduction ROM of Rt shoulder without pain to more readily complete overhead reaching    Time 3    Period Weeks    Status New    Target Date 03/23/21      PT SHORT TERM GOAL #2   Title Patient will demonstrate at least 4+/5 ER and IR strength for improved manipulation of objects for self-care    Time 3    Period Weeks    Status New     Target Date 03/23/21      PT SHORT TERM GOAL #3   Title Patient will report no more than 2 instances of pain while sleeping to improve sleep hygiene    Time 3    Period Weeks    Status New    Target Date 03/23/21             PT Long Term Goals - 03/02/21 0909      PT LONG TERM GOAL #1   Title Patient will improve FOTO score to 66 or more to indicate improved functional use of Rt UE    Baseline 46    Time 6    Period Weeks    Status New    Target Date 04/13/21      PT LONG TERM GOAL #2   Title Patient will reach behind back and behind head with 0/10 pain to more readily complete self-care and grooming tasks    Time 6    Period Weeks    Status New    Target Date 04/13/21      PT LONG TERM GOAL #3   Title Patient will raise at least 4# from waist to shoulder level with no more than 1/10 pain to indicate improved readiness for reaching ADLs.    Time 6    Period Weeks    Status New    Target Date 04/13/21                 Plan - 03/03/21 1633    Clinical Impression Statement Patient reporting with increased Rt shoulder pain this date. Reports that HEP exercises are painful except for resisted shoulder extension. Initially able to perform sidelying ER but then reporting to be painful at start of second set. Patient reports increased tenderness of Rt upper trap, levator scap, supraspinatus, and middle deltoid with palpation. Reporting no increased pain with prone shoulder extension. Will continue prone scapular strengthening and AAROM going forward. Iontophoresis applied over Rt supraspinatus. Patient provided educaiton regarding uses and how to monitor for adverse response. Would benefit from continued skilled intervention to address impairments for decreased pain and improved functional use of Rt UE.    Personal Factors and Comorbidities Comorbidity 2    Comorbidities stroke, seizure    Examination-Activity Limitations Carry;Reach  Overhead;Dressing;Hygiene/Grooming;Lift;Sleep  Examination-Participation Restrictions Cleaning;Meal Prep;Shop    Rehab Potential Excellent    PT Frequency 2x / week    PT Duration 6 weeks    PT Treatment/Interventions Cryotherapy;Electrical Stimulation;Iontophoresis 4mg /ml Dexamethasone;Therapeutic activities;Therapeutic exercise;Neuromuscular re-education;Patient/family education;Manual techniques;Passive range of motion;Dry needling;Taping    PT Next Visit Plan continue scapular stablization and AAROM/AROM progressing to patient tolerance; DN    PT Home Exercise Plan Access [email or web address]    Consulted and Agree with Plan of Care Patient           Patient will benefit from skilled therapeutic intervention in order to improve the following deficits and impairments:  Decreased activity tolerance,Decreased endurance,Decreased mobility,Decreased range of motion,Decreased strength,Increased muscle spasms,Impaired perceived functional ability,Impaired UE functional use,Improper body mechanics,Postural dysfunction  Visit Diagnosis: Acute pain of right shoulder  Decreased range of motion of right shoulder  Deficit in activities of daily living (ADL)  Cramp and spasm     Problem List Patient Active Problem List   Diagnosis Date Noted  . Goiter 04/23/2018  . Hemiplegic migraine 02/05/2018   Everardo All PT, DPT  03/03/21 4:52 PM  [facility] [address] [place], [place], [address] Phone: [phone]   Fax:  [phone]  Name: Whitney Kent MRN: [id number] Date of Birth: [birth year]

## 2021-03-03 NOTE — Patient Instructions (Addendum)

## 2021-03-09 ENCOUNTER — Ambulatory Visit: Payer: 59 | Admitting: Physical Therapy

## 2021-03-11 ENCOUNTER — Encounter: Payer: Self-pay | Admitting: Physical Therapy

## 2021-03-11 ENCOUNTER — Ambulatory Visit: Payer: 59 | Admitting: Physical Therapy

## 2021-03-11 ENCOUNTER — Other Ambulatory Visit: Payer: Self-pay

## 2021-03-11 DIAGNOSIS — Z789 Other specified health status: Secondary | ICD-10-CM

## 2021-03-11 DIAGNOSIS — M25611 Stiffness of right shoulder, not elsewhere classified: Secondary | ICD-10-CM

## 2021-03-11 DIAGNOSIS — M25511 Pain in right shoulder: Secondary | ICD-10-CM

## 2021-03-11 DIAGNOSIS — R252 Cramp and spasm: Secondary | ICD-10-CM

## 2021-03-11 NOTE — Therapy (Addendum)
[facility] [address] [place], [place], [address] Phone: [phone]   Fax:  [phone]  Physical Therapy Treatment  Patient Details  Name: Whitney Kent MRN: [id number] Date of Birth: 20-Dec-1970 Referring Provider (PT): Lynne Leader, MD   Encounter Date: 03/11/2021   PT End of Session - 03/11/21 1711     Visit Number 3    Number of Visits 12    Date for PT Re-Evaluation 04/13/21    PT Start Time 1622    PT Stop Time 1701    PT Time Calculation (min) 39 min             Past Medical History:  Diagnosis Date   Allergy    Anemia    Anxiety    Depression    GERD (gastroesophageal reflux disease)    Goiter    dr Dwyane Dee follows  last [place] 2 yrs ago   Hemiplegic migraine    pt dx 2004.  Pt had 1 seizure after this dx.  Not sure if related to dx or not. maw   History of asthma    none since age 61   Seizures ([place])    2004. only 1 seizure   Stroke ([place]) 2019   mild no residual deficit found on mri   Syncope    occ with migraine   Vitamin D deficiency     Past Surgical History:  Procedure Laterality Date   COLONOSCOPY  2018   DILATATION & CURETTAGE/HYSTEROSCOPY WITH MYOSURE N/A 07/11/2019   Procedure: DILATATION & CURETTAGE/HYSTEROSCOPY WITH MYOSURE;  Surgeon: Servando Salina, MD;  Location: [place];  Service: Gynecology;  Laterality: N/A;   LAPAROSCOPIC TUBAL LIGATION Bilateral 11/27/2020   Procedure: LAPAROSCOPIC TUBAL LIGATION with BIPOLAR CAUTERY;  Surgeon: Servando Salina, MD;  Location: [place];  Service: Gynecology;  Laterality: Bilateral;   WISDOM TOOTH EXTRACTION     2009 x4 removed    There were no vitals filed for this visit.   Subjective Assessment - 03/11/21 1624     Subjective Patient reports no significant Rt shoulder pain. Has been prescribed pain medication due to another health issue. Feels this may be affecting the shoulder  pain as well.    Pertinent History stroke, seizures    How long can you sit comfortably? unlimited    How long can you stand comfortably? unlimited    How long can you walk comfortably? unlimited    Patient Stated Goals to resolve this issue; to carry backpack without pain    Currently in Pain? Yes    Pain Score 1     Pain Location Shoulder    Pain Orientation Right    Pain Type Acute pain    Pain Onset More than a month ago    Pain Frequency Intermittent    Multiple Pain Sites No                               [facility] Adult PT Treatment/Exercise - 03/11/21 0001       Neck Exercises: Machines for Strengthening   UBE (Upper Arm Bike) lvl 1; 2 min fwd/2 min back      Shoulder Exercises: Supine   Flexion AAROM;10 reps    Shoulder Flexion Weight (lbs) with wand    Other Supine Exercises wand chest press 2x10      Shoulder Exercises: Seated   Retraction AROM;Both  Retraction Weight (lbs) 2x10; TC for scapular depression and retraction      Shoulder Exercises: Sidelying   External Rotation AROM;Right    External Rotation Weight (lbs) 2x10      Manual Therapy   Manual Therapy Soft tissue mobilization    Soft tissue mobilization skilled palpation and assessment of tissues before, during, and following DN              Trigger Point Dry Needling - 03/11/21 0001     Consent Given? Yes    Education Handout Provided Previously provided    Muscles Treated Head and Neck Upper trapezius;Levator scapulae    Muscles Treated Upper Quadrant Supraspinatus;Infraspinatus;Deltoid;Teres major    Dry Needling Comments Rt only    Upper Trapezius Response Twitch reponse elicited;Palpable increased muscle length    Levator Scapulae Response Twitch response elicited;Palpable increased muscle length    Supraspinatus Response Palpable increased muscle length    Infraspinatus Response Twitch response elicited;Palpable increased muscle length    Deltoid Response Palpable  increased muscle length    Teres major Response Palpable increased muscle length                    PT Short Term Goals - 03/02/21 0907       PT SHORT TERM GOAL #1   Title Patient will demonstrate full flexion and abduction ROM of Rt shoulder without pain to more readily complete overhead reaching    Time 3    Period Weeks    Status New    Target Date 03/23/21      PT SHORT TERM GOAL #2   Title Patient will demonstrate at least 4+/5 ER and IR strength for improved manipulation of objects for self-care    Time 3    Period Weeks    Status New    Target Date 03/23/21      PT SHORT TERM GOAL #3   Title Patient will report no more than 2 instances of pain while sleeping to improve sleep hygiene    Time 3    Period Weeks    Status New    Target Date 03/23/21               PT Long Term Goals - 03/02/21 0909       PT LONG TERM GOAL #1   Title Patient will improve FOTO score to 66 or more to indicate improved functional use of Rt UE    Baseline 46    Time 6    Period Weeks    Status New    Target Date 04/13/21      PT LONG TERM GOAL #2   Title Patient will reach behind back and behind head with 0/10 pain to more readily complete self-care and grooming tasks    Time 6    Period Weeks    Status New    Target Date 04/13/21      PT LONG TERM GOAL #3   Title Patient will raise at least 4# from waist to shoulder level with no more than 1/10 pain to indicate improved readiness for reaching ADLs.    Time 6    Period Weeks    Status New    Target Date 04/13/21                   Plan - 03/11/21 1712     Clinical Impression Statement Patient reporting decreased Rt shoulder pain this date. Attributes this partially due  to newly prescribed pain medication due to ovarian polyps. Is scheduled for possible surgery 04/02/2021. Demonstrates improved activity tolerance as patient able to tolerate introduction of UBE during session. Continues to report pain with  AAROM in supine. In sidelying patient reporting decreased pain with manual scapular stabilization however, reporting increased pain with seated scapular retraction. Signfiicant twitch response elicited with DN to Lt upper trap and levator scap. Would benefit from continued skilled intervention to address impairments for decreased pain and improved functional use of Rt UE.    Personal Factors and Comorbidities Comorbidity 2    Comorbidities stroke, seizure    Examination-Activity Limitations Carry;Reach Overhead;Dressing;Hygiene/Grooming;Lift;Sleep    Examination-Participation Restrictions Cleaning;Meal Prep;Shop    Rehab Potential Excellent    PT Frequency 2x / week    PT Duration 6 weeks    PT Treatment/Interventions Cryotherapy;Electrical Stimulation;Iontophoresis 54m/ml Dexamethasone;Therapeutic activities;Therapeutic exercise;Neuromuscular re-education;Patient/family education;Manual techniques;Passive range of motion;Dry needling;Taping    PT Next Visit Plan progress AAROM/AROM to patient tolerance, continue scapular strengthening, assess response to DN    PT Home Exercise Plan Access cOOLD:[email or web address]   Consulted and Agree with Plan of Care Patient             Patient will benefit from skilled therapeutic intervention in order to improve the following deficits and impairments:  Decreased activity tolerance,Decreased endurance,Decreased mobility,Decreased range of motion,Decreased strength,Increased muscle spasms,Impaired perceived functional ability,Impaired UE functional use,Improper body mechanics,Postural dysfunction  Visit Diagnosis: Acute pain of right shoulder  Decreased range of motion of right shoulder  Deficit in activities of daily living (ADL)  Cramp and spasm     Problem List Patient Active Problem List   Diagnosis Date Noted   Goiter 04/23/2018   Hemiplegic migraine 02/05/2018   PHYSICAL THERAPY DISCHARGE SUMMARY  Visits from Start of Care: 3  Current  functional level related to goals / functional outcomes: See above   Remaining deficits: See above   Education / Equipment: See above   Patient agrees to discharge. Patient goals were not met. Patient is being discharged due to not returning since the last visit.   KEverardo Kent, Whitney Kent  07/13/21 1:00 PM  [facility] [address] [place] [address]Phone: [phone]  Fax:  [phone] Name: CBrendy FicekMRN: [id number] of Birth: 305-24-1972

## 2021-03-14 ENCOUNTER — Other Ambulatory Visit: Payer: Self-pay | Admitting: Obstetrics and Gynecology

## 2021-03-15 ENCOUNTER — Ambulatory Visit: Payer: 59 | Admitting: Physical Therapy

## 2021-03-17 ENCOUNTER — Ambulatory Visit: Payer: 59 | Admitting: Physical Therapy

## 2021-03-17 ENCOUNTER — Telehealth: Payer: Self-pay | Admitting: Physical Therapy

## 2021-03-17 NOTE — Telephone Encounter (Signed)
Called patient about No show appointment 3/24 and late cancelled appointment today. Advised patient of attendance policy and left office number to call back to discuss scheduling further.   Everardo All PT, DPT  03/17/21 8:14 AM

## 2021-03-24 ENCOUNTER — Encounter: Payer: PRIVATE HEALTH INSURANCE | Admitting: Physical Therapy

## 2021-03-26 ENCOUNTER — Encounter: Payer: PRIVATE HEALTH INSURANCE | Admitting: Physical Therapy

## 2021-03-30 ENCOUNTER — Encounter: Payer: PRIVATE HEALTH INSURANCE | Admitting: Physical Therapy

## 2021-03-31 ENCOUNTER — Ambulatory Visit: Payer: PRIVATE HEALTH INSURANCE | Admitting: Family Medicine

## 2021-04-01 ENCOUNTER — Encounter: Payer: PRIVATE HEALTH INSURANCE | Admitting: Physical Therapy

## 2021-04-01 ENCOUNTER — Encounter (HOSPITAL_BASED_OUTPATIENT_CLINIC_OR_DEPARTMENT_OTHER): Payer: Self-pay | Admitting: Obstetrics and Gynecology

## 2021-04-01 ENCOUNTER — Other Ambulatory Visit: Payer: Self-pay

## 2021-04-01 NOTE — Progress Notes (Signed)
Spoke w/ via phone for pre-op interview---pt Lab needs dos----  Cbc t & s  Urine preg             Lab results------lov dr Dwyane Dee endocrinology ( right neck goiter) 01-28-2021 epic thryoid [place] 04-03-2018 epic, lov neurology dr Jaynee Eagles 04-06-2018 epic COVID test ------04-07-2021 300 pm Arrive at -------1045 am 04-09-2021 NPO after MN NO Solid Food.  Clear liquids from MN until---945 am then npo Med rec completed Medications to take morning of surgery -----omeprazole, zyrtec prn, megace Diabetic medication -----n/a Patient instructed to bring photo id and insurance card day of surgery Patient aware to have Driver (ride ) / caregiver  Al jones boyfriend  for 24 hours after surgery  Patient Special Instructions -----none Pre-Op special Istructions -----none Patient verbalized understanding of instructions that were given at this phone interview. Patient denies shortness of breath, chest pain, fever, cough at this phone interview.

## 2021-04-05 ENCOUNTER — Encounter: Payer: PRIVATE HEALTH INSURANCE | Admitting: Physical Therapy

## 2021-04-07 ENCOUNTER — Other Ambulatory Visit (HOSPITAL_COMMUNITY)
Admission: RE | Admit: 2021-04-07 | Discharge: 2021-04-07 | Disposition: A | Discharge status: Home / Self Care | Payer: PRIVATE HEALTH INSURANCE | Source: Ambulatory Visit | Attending: Obstetrics and Gynecology | Admitting: Obstetrics and Gynecology

## 2021-04-07 ENCOUNTER — Encounter: Payer: PRIVATE HEALTH INSURANCE | Admitting: Physical Therapy

## 2021-04-07 DIAGNOSIS — Z20822 Contact with and (suspected) exposure to covid-19: Secondary | ICD-10-CM | POA: Diagnosis not present

## 2021-04-07 DIAGNOSIS — Z01812 Encounter for preprocedural laboratory examination: Secondary | ICD-10-CM | POA: Diagnosis present

## 2021-04-07 LAB — SARS CORONAVIRUS 2 (TAT 6-24 HRS): SARS Coronavirus 2: NEGATIVE

## 2021-04-09 ENCOUNTER — Encounter (HOSPITAL_BASED_OUTPATIENT_CLINIC_OR_DEPARTMENT_OTHER)
Admission: RE | Disposition: A | Discharge status: Home / Self Care | Payer: Self-pay | Source: Home / Self Care | Attending: Obstetrics and Gynecology

## 2021-04-09 ENCOUNTER — Encounter (HOSPITAL_BASED_OUTPATIENT_CLINIC_OR_DEPARTMENT_OTHER): Payer: Self-pay | Admitting: Obstetrics and Gynecology

## 2021-04-09 ENCOUNTER — Ambulatory Visit (HOSPITAL_BASED_OUTPATIENT_CLINIC_OR_DEPARTMENT_OTHER)
Admission: RE | Admit: 2021-04-09 | Discharge: 2021-04-09 | Disposition: A | Discharge status: Home / Self Care | Payer: 59 | Attending: Obstetrics and Gynecology | Admitting: Obstetrics and Gynecology

## 2021-04-09 ENCOUNTER — Other Ambulatory Visit: Payer: Self-pay

## 2021-04-09 ENCOUNTER — Ambulatory Visit (HOSPITAL_BASED_OUTPATIENT_CLINIC_OR_DEPARTMENT_OTHER): Payer: 59 | Admitting: Anesthesiology

## 2021-04-09 DIAGNOSIS — N921 Excessive and frequent menstruation with irregular cycle: Secondary | ICD-10-CM | POA: Insufficient documentation

## 2021-04-09 DIAGNOSIS — N938 Other specified abnormal uterine and vaginal bleeding: Secondary | ICD-10-CM | POA: Diagnosis not present

## 2021-04-09 DIAGNOSIS — Z8673 Personal history of transient ischemic attack (TIA), and cerebral infarction without residual deficits: Secondary | ICD-10-CM | POA: Insufficient documentation

## 2021-04-09 DIAGNOSIS — N840 Polyp of corpus uteri: Secondary | ICD-10-CM | POA: Insufficient documentation

## 2021-04-09 HISTORY — DX: Family history of other specified conditions: Z84.89

## 2021-04-09 HISTORY — PX: DILATATION & CURETTAGE/HYSTEROSCOPY WITH MYOSURE: SHX6511

## 2021-04-09 HISTORY — DX: Attention-deficit hyperactivity disorder, unspecified type: F90.9

## 2021-04-09 HISTORY — DX: Personal history of other mental and behavioral disorders: Z86.59

## 2021-04-09 LAB — CBC
HCT: 41.5 % (ref 36.0–46.0)
Hemoglobin: 13.1 g/dL (ref 12.0–15.0)
MCH: 26.8 pg (ref 26.0–34.0)
MCHC: 31.6 g/dL (ref 30.0–36.0)
MCV: 84.9 fL (ref 80.0–100.0)
Platelets: 248 10*3/uL (ref 150–400)
RBC: 4.89 MIL/uL (ref 3.87–5.11)
RDW: 12.3 % (ref 11.5–15.5)
WBC: 6.9 10*3/uL (ref 4.0–10.5)
nRBC: 0 % (ref 0.0–0.2)

## 2021-04-09 LAB — POCT PREGNANCY, URINE: Preg Test, Ur: NEGATIVE

## 2021-04-09 LAB — TYPE AND SCREEN
ABO/RH(D): O POS
Antibody Screen: NEGATIVE

## 2021-04-09 SURGERY — DILATATION & CURETTAGE/HYSTEROSCOPY WITH MYOSURE
Anesthesia: General | Site: Uterus

## 2021-04-09 MED ORDER — ONDANSETRON HCL 4 MG/2ML IJ SOLN
INTRAMUSCULAR | Status: DC | PRN
  Administered 2021-04-09: 4 mg via INTRAVENOUS

## 2021-04-09 MED ORDER — FENTANYL CITRATE (PF) 100 MCG/2ML IJ SOLN: 25.0000 ug | INTRAMUSCULAR | Status: DC | PRN

## 2021-04-09 MED ORDER — PROPOFOL 10 MG/ML IV BOLUS
INTRAVENOUS | Status: AC
  Filled 2021-04-09: qty 20

## 2021-04-09 MED ORDER — PROMETHAZINE HCL 25 MG/ML IJ SOLN
6.2500 mg | INTRAMUSCULAR | Status: AC | PRN
  Administered 2021-04-09 (×2): 6.25 mg via INTRAVENOUS

## 2021-04-09 MED ORDER — MIDAZOLAM HCL 2 MG/2ML IJ SOLN
INTRAMUSCULAR | Status: AC
  Filled 2021-04-09: qty 2

## 2021-04-09 MED ORDER — LIDOCAINE 2% (20 MG/ML) 5 ML SYRINGE
INTRAMUSCULAR | Status: AC
  Filled 2021-04-09: qty 5

## 2021-04-09 MED ORDER — KETOROLAC TROMETHAMINE 30 MG/ML IJ SOLN
INTRAMUSCULAR | Status: AC
  Filled 2021-04-09: qty 1

## 2021-04-09 MED ORDER — KETOROLAC TROMETHAMINE 30 MG/ML IJ SOLN
INTRAMUSCULAR | Status: DC | PRN
  Administered 2021-04-09: 30 mg via INTRAVENOUS

## 2021-04-09 MED ORDER — POVIDONE-IODINE 10 % EX SWAB: 2.0000 "application " | Freq: Once | CUTANEOUS | Status: DC

## 2021-04-09 MED ORDER — MISOPROSTOL 200 MCG PO TABS
200.0000 ug | ORAL_TABLET | Freq: Once | ORAL | Status: AC
  Administered 2021-04-09: 200 ug via VAGINAL

## 2021-04-09 MED ORDER — MISOPROSTOL 200 MCG PO TABS
ORAL_TABLET | ORAL | Status: AC
  Filled 2021-04-09: qty 1

## 2021-04-09 MED ORDER — MIDAZOLAM HCL 2 MG/2ML IJ SOLN
INTRAMUSCULAR | Status: DC | PRN
  Administered 2021-04-09: 2 mg via INTRAVENOUS

## 2021-04-09 MED ORDER — ACETAMINOPHEN 500 MG PO TABS
1000.0000 mg | ORAL_TABLET | Freq: Once | ORAL | Status: AC
  Administered 2021-04-09: 1000 mg via ORAL

## 2021-04-09 MED ORDER — LACTATED RINGERS IV SOLN: INTRAVENOUS | Status: DC

## 2021-04-09 MED ORDER — FENTANYL CITRATE (PF) 100 MCG/2ML IJ SOLN
INTRAMUSCULAR | Status: DC | PRN
  Administered 2021-04-09: 25 ug via INTRAVENOUS
  Administered 2021-04-09: 50 ug via INTRAVENOUS
  Administered 2021-04-09: 25 ug via INTRAVENOUS

## 2021-04-09 MED ORDER — PROPOFOL 10 MG/ML IV BOLUS
INTRAVENOUS | Status: DC | PRN
  Administered 2021-04-09: 120 mg via INTRAVENOUS

## 2021-04-09 MED ORDER — OXYCODONE HCL 5 MG/5ML PO SOLN: 5.0000 mg | Freq: Once | ORAL | Status: AC | PRN

## 2021-04-09 MED ORDER — PROMETHAZINE HCL 25 MG/ML IJ SOLN
INTRAMUSCULAR | Status: AC
  Filled 2021-04-09: qty 1

## 2021-04-09 MED ORDER — DEXAMETHASONE SODIUM PHOSPHATE 10 MG/ML IJ SOLN
INTRAMUSCULAR | Status: AC
  Filled 2021-04-09: qty 1

## 2021-04-09 MED ORDER — LIDOCAINE 2% (20 MG/ML) 5 ML SYRINGE
INTRAMUSCULAR | Status: DC | PRN
  Administered 2021-04-09: 80 mg via INTRAVENOUS

## 2021-04-09 MED ORDER — OXYCODONE HCL 5 MG PO TABS
5.0000 mg | ORAL_TABLET | Freq: Once | ORAL | Status: AC | PRN
  Administered 2021-04-09: 5 mg via ORAL

## 2021-04-09 MED ORDER — OXYCODONE HCL 5 MG PO TABS
ORAL_TABLET | ORAL | Status: AC
  Filled 2021-04-09: qty 1

## 2021-04-09 MED ORDER — ONDANSETRON HCL 4 MG/2ML IJ SOLN
INTRAMUSCULAR | Status: AC
  Filled 2021-04-09: qty 2

## 2021-04-09 MED ORDER — FENTANYL CITRATE (PF) 100 MCG/2ML IJ SOLN
INTRAMUSCULAR | Status: AC
  Filled 2021-04-09: qty 2

## 2021-04-09 MED ORDER — DEXAMETHASONE SODIUM PHOSPHATE 10 MG/ML IJ SOLN
INTRAMUSCULAR | Status: DC | PRN
  Administered 2021-04-09: 10 mg via INTRAVENOUS

## 2021-04-09 MED ORDER — SODIUM CHLORIDE 0.9 % IR SOLN
Status: DC | PRN
  Administered 2021-04-09: 3000 mL

## 2021-04-09 MED ORDER — ACETAMINOPHEN 500 MG PO TABS
ORAL_TABLET | ORAL | Status: AC
  Filled 2021-04-09: qty 2

## 2021-04-09 SURGICAL SUPPLY — 22 items
CANISTER SUCT 3000ML PPV (MISCELLANEOUS) ×2 IMPLANT
CATH ROBINSON RED A/P 16FR (CATHETERS) IMPLANT
COVER WAND RF STERILE (DRAPES) ×2 IMPLANT
DEVICE MYOSURE LITE (MISCELLANEOUS) IMPLANT
DEVICE MYOSURE REACH (MISCELLANEOUS) ×1 IMPLANT
DILATOR CANAL MILEX (MISCELLANEOUS) IMPLANT
GAUZE 4X4 16PLY RFD (DISPOSABLE) ×2 IMPLANT
GLOVE SURG LTX SZ6.5 (GLOVE) ×2 IMPLANT
GLOVE SURG UNDER POLY LF SZ7 (GLOVE) ×2 IMPLANT
GOWN STRL REUS W/TWL LRG LVL3 (GOWN DISPOSABLE) ×2 IMPLANT
IV NS IRRIG 3000ML ARTHROMATIC (IV SOLUTION) ×2 IMPLANT
KIT PROCEDURE FLUENT (KITS) ×2 IMPLANT
KIT TURNOVER CYSTO (KITS) ×2 IMPLANT
MYOSURE XL FIBROID (MISCELLANEOUS)
PACK VAGINAL MINOR WOMEN LF (CUSTOM PROCEDURE TRAY) ×2 IMPLANT
PAD OB MATERNITY 4.3X12.25 (PERSONAL CARE ITEMS) ×2 IMPLANT
PAD PREP 24X48 CUFFED NSTRL (MISCELLANEOUS) ×2 IMPLANT
SEAL CERVICAL OMNI LOK (ABLATOR) IMPLANT
SEAL ROD LENS SCOPE MYOSURE (ABLATOR) ×2 IMPLANT
SYSTEM TISS REMOVAL MYOSURE XL (MISCELLANEOUS) IMPLANT
TOWEL OR 17X26 10 PK STRL BLUE (TOWEL DISPOSABLE) ×2 IMPLANT
WATER STERILE IRR 500ML POUR (IV SOLUTION) ×2 IMPLANT

## 2021-04-09 NOTE — Anesthesia Preprocedure Evaluation (Addendum)
Anesthesia Evaluation  Patient identified by MRN, date of birth, ID band Patient awake    Reviewed: Allergy & Precautions, NPO status , Patient's Chart, lab work & pertinent test results  History of Anesthesia Complications Negative for: history of anesthetic complications  Airway Mallampati: II  TM Distance: >3 FB Neck ROM: Full    Dental no notable dental hx.    Pulmonary neg pulmonary ROS,    Pulmonary exam normal        Cardiovascular negative cardio ROS Normal cardiovascular exam     Neuro/Psych  Headaches, Anxiety    GI/Hepatic Neg liver ROS, GERD  Medicated and Controlled,  Endo/Other  negative endocrine ROS  Renal/GU negative Renal ROS  negative genitourinary   Musculoskeletal negative musculoskeletal ROS (+)   Abdominal   Peds  Hematology negative hematology ROS (+)   Anesthesia Other Findings Day of surgery medications reviewed with patient.  Reproductive/Obstetrics endometrial mass, dysfunctional uterine bleeding                            Anesthesia Physical Anesthesia Plan  ASA: II  Anesthesia Plan: General   Post-op Pain Management:    Induction: Intravenous  PONV Risk Score and Plan: 3 and Treatment may vary due to age or medical condition, Ondansetron, Dexamethasone and Midazolam  Airway Management Planned: LMA  Additional Equipment: None  Intra-op Plan:   Post-operative Plan: Extubation in OR  Informed Consent: I have reviewed the patients History and Physical, chart, labs and discussed the procedure including the risks, benefits and alternatives for the proposed anesthesia with the patient or authorized representative who has indicated his/her understanding and acceptance.     Dental advisory given  Plan Discussed with: CRNA  Anesthesia Plan Comments:        Anesthesia Quick Evaluation

## 2021-04-09 NOTE — Interval H&P Note (Signed)
History and Physical Interval Note:  04/09/2021 11:08 AM  [name]  has presented today for surgery, with the diagnosis of endometrial mass Dysfunctional uterine bleeding.  The various methods of treatment have been discussed with the patient and family. After consideration of risks, benefits and other options for treatment, the patient has consented to  DIAGNOSTIC HYSTEROSCOPY, HYSTEROSCOPIC RESECTION OF ENDOMETRIAL MASS/POLYP, DILATION AND CURETTAGE as a surgical intervention.  The patient's history has been reviewed, patient examined, no change in status, stable for surgery.  I have reviewed the patient's chart and labs.  Questions were answered to the patient's satisfaction.     Bernerd Terhune A Jaimie Redditt

## 2021-04-09 NOTE — H&P (Signed)
Whitney Kent is an 50 y.o. female. MF here for planned dx hysteroscopy, D&C, resection of endometrial polyp using myosure. Pt was found to have endometrial polyp on sonogram done for abnormal bleeding  Pertinent Gynecological History: Menses: flow is excessive with use of 5 pads or tampons on heaviest days Bleeding: menorrhagia with irreg cycles Contraception: tubal ligation DES exposure: denies Blood transfusions: none Sexually transmitted diseases: no past history Previous GYN Procedures: LTL  Last mammogram: normal Date: 2021 Last pap: normal Date: 2021 OB History: [id number]   Menstrual History: Menarche age: n/a Patient's last menstrual period was 02/01/2021.    Past Medical History:  Diagnosis Date  . ADHD dx feb 2022   no meds for   . Allergy   . Anemia   . Anxiety   . Family history of adverse reaction to anesthesia    daughter gets hiccups after anesthesia  . GERD (gastroesophageal reflux disease)   . Goiter right neck   dr Dwyane Dee follows lov 01-28-2021   last [place] 04-03-2018  . Hemiplegic migraine    pt dx 2004.  Pt had 1 seizure after this dx.  Not sure if related to dx or not. maw  . History of asthma    none since age 66  . History of depression   . Seizures ([place])    2004. only 1 seizure  . Stroke ([place]) 2019   mild no residual deficit found on mri  . Syncope    occ with migraine fatigue with aura and migraine  . Vitamin D deficiency     Past Surgical History:  Procedure Laterality Date  . COLONOSCOPY  2018  . DILATATION & CURETTAGE/HYSTEROSCOPY WITH MYOSURE N/A 07/11/2019   Procedure: DILATATION & CURETTAGE/HYSTEROSCOPY WITH MYOSURE;  Surgeon: Servando Salina, MD;  Location: [place];  Service: Gynecology;  Laterality: N/A;  . LAPAROSCOPIC TUBAL LIGATION Bilateral 11/27/2020   Procedure: LAPAROSCOPIC TUBAL LIGATION with BIPOLAR CAUTERY;  Surgeon: Servando Salina, MD;  Location: [place];  Service:  Gynecology;  Laterality: Bilateral;  . WISDOM TOOTH EXTRACTION     2009 x4 removed    Family History  Problem Relation Age of Onset  . Uterine cancer Mother   . Alcohol abuse Mother   . Arthritis Mother   . Depression Mother   . Drug abuse Mother   . Heart disease Mother   . Mental illness Mother   . Stroke Mother   . Kidney failure Father   . Alcohol abuse Father   . Diabetes Father   . Prostate cancer Father   . Colon cancer Father 61  . Kidney disease Father   . Depression Brother   . Arthritis Maternal Grandmother   . COPD Maternal Grandmother   . Heart attack Maternal Grandmother   . Hypertension Maternal Grandmother   . Hyperlipidemia Maternal Grandmother   . Arthritis Maternal Grandfather   . Heart attack Maternal Grandfather   . Hyperlipidemia Maternal Grandfather   . Diabetes Paternal Grandfather   . Depression Daughter   . Asthma Daughter   . Depression Daughter   . Autism Daughter   . Esophageal cancer Neg Hx   . Pancreatic cancer Neg Hx   . Rectal cancer Neg Hx   . Stomach cancer Neg Hx   . Thyroid disease Neg Hx     Social History:  reports that she has never smoked. She has never used smokeless tobacco. She reports current alcohol use. She reports that she does not use  drugs.  Allergies: No Known Allergies  No medications prior to admission.    Review of Systems  All other systems reviewed and are negative.   Height 5' 2.5" (1.588 m), weight 68 kg, last menstrual period 02/01/2021. Physical Exam Constitutional:      Appearance: Normal appearance.  Eyes:     Extraocular Movements: Extraocular movements intact.  Cardiovascular:     Rate and Rhythm: Regular rhythm.     Heart sounds: Normal heart sounds.  Pulmonary:     Breath sounds: Normal breath sounds.  Abdominal:     Palpations: Abdomen is soft.  Genitourinary:    General: Normal vulva.     Comments: Vagina nl  cervix parous  utuers AV nl  Adnexa nl Musculoskeletal:         General: Normal range of motion.     Cervical back: Neck supple.  Skin:    General: Skin is warm and dry.  Neurological:     Mental Status: She is alert and oriented to person, place, and time.  Psychiatric:        Mood and Affect: Mood normal.        Behavior: Behavior normal.     No results found for this or any previous visit (from the past 24 hour(s)).  No results found.  Assessment/Plan: Menorrhagia with irreg cycles  endometrial polyp P) dx hysteroscopy, D&C, resection of endometrial polyp. Risk of surgery reviewed Including infection, bleeding, injury to surrounding organ structures, uterine perforation( 12/998) and its risk, thermal injury, fluid overload and its mgmt. All ? answered  Kiefer Opheim A Eivin Mascio 04/09/2021, 3:51 AM

## 2021-04-09 NOTE — Transfer of Care (Signed)
Immediate Anesthesia Transfer of Care Note  Patient: Whitney Kent  Procedure(s) Performed: Procedure(s) (LRB): DILATATION & CURETTAGE/HYSTEROSCOPY WITH MYOSURE (N/A)  Patient Location: PACU  Anesthesia Type: General  Level of Consciousness: awake, alert  and oriented  Airway & Oxygen Therapy: Patient Spontanous Breathing and Patient connected to face mask oxygen  Post-op Assessment: Report given to PACU RN and Post -op Vital signs reviewed and stable  Post vital signs: Reviewed and stable  Complications: No apparent anesthesia complications  Post vital signs:  Last Vitals:  Vitals Value Taken Time  BP    Temp    Pulse 84 04/09/21 1314  Resp 9 04/09/21 1314  SpO2 100 % 04/09/21 1314  Vitals shown include unvalidated device data.  Last Pain:  Vitals:   04/09/21 1134  TempSrc: Oral         Complications: No complications documented.

## 2021-04-09 NOTE — Anesthesia Procedure Notes (Signed)
Procedure Name: LMA Insertion Date/Time: 04/09/2021 12:42 PM Performed by: Mechele Claude, CRNA Pre-anesthesia Checklist: Patient identified, Emergency Drugs available, Suction available and Patient being monitored Patient Re-evaluated:Patient Re-evaluated prior to induction Oxygen Delivery Method: Circle system utilized Preoxygenation: Pre-oxygenation with 100% oxygen Induction Type: IV induction Ventilation: Mask ventilation without difficulty LMA: LMA inserted LMA Size: 4.0 Number of attempts: 1 Airway Equipment and Method: Bite block Placement Confirmation: positive ETCO2 Tube secured with: Tape Dental Injury: Teeth and Oropharynx as per pre-operative assessment

## 2021-04-09 NOTE — Anesthesia Postprocedure Evaluation (Signed)
Anesthesia Post Note  Patient: Whitney Kent  Procedure(s) Performed: DILATATION & CURETTAGE/HYSTEROSCOPY WITH MYOSURE (N/A Uterus)     Patient location during evaluation: PACU Anesthesia Type: General Level of consciousness: awake and alert and oriented Pain management: pain level controlled Vital Signs Assessment: post-procedure vital signs reviewed and stable Respiratory status: spontaneous breathing, nonlabored ventilation and respiratory function stable Cardiovascular status: blood pressure returned to baseline Postop Assessment: no apparent nausea or vomiting Anesthetic complications: no   No complications documented.  Last Vitals:  Vitals:   04/09/21 1330 04/09/21 1345  BP: 104/72 (!) 98/58  Pulse: 78 78  Resp: 10 11  Temp:    SpO2: 100% 100%    Last Pain:  Vitals:   04/09/21 1313  TempSrc:   PainSc: 0-No pain                 Brennan Bailey

## 2021-04-09 NOTE — Brief Op Note (Signed)
04/09/2021  1:03 PM  PATIENT:  Whitney Kent  50 y.o. female  PRE-OPERATIVE DIAGNOSIS:  endometrial mass Dysfunctional uterine bleeding  POST-OPERATIVE DIAGNOSIS:  endometrial polyps, Dysfunctional uterine bleeding  PROCEDURE:  diagnostic hysteroscopy, hysteroscopic resection of endometrial polyps, dilation and curettage  SURGEON:  Surgeon(s) and Role:    * Servando Salina, MD - Primary  PHYSICIAN ASSISTANT:   ASSISTANTS: none   ANESTHESIA:   general FINDINGS; multiple endometrial polyps, no endocervical lesion, tubal ostia seen EBL:  10 mL   BLOOD ADMINISTERED:none  DRAINS: none   LOCAL MEDICATIONS USED:  NONE  SPECIMEN:  Source of Specimen:  EMC with polyps  DISPOSITION OF SPECIMEN:  PATHOLOGY  COUNTS:  YES  TOURNIQUET:  * No tourniquets in log *  DICTATION: .Other Dictation: Dictation Number [id number]  PLAN OF CARE: Discharge to home after PACU  PATIENT DISPOSITION:  PACU - hemodynamically stable.   Delay start of Pharmacological VTE agent (>24hrs) due to surgical blood loss or risk of bleeding: no

## 2021-04-09 NOTE — Discharge Instructions (Signed)
CALL  IF TEMP>100.4, NOTHING PER VAGINA X 2 WK, CALL IF SOAKING A MAXI  PAD EVERY HOUR OR MORE FREQUENTLYCALL  IF TEMP>100.4, NOTHING PER VAGINA X 2 WK, CALL IF SOAKING A MAXI  PAD EVERY HOUR OR MORE FREQUENTLY  DISCHARGE INSTRUCTIONS: D&C / D&E The following instructions have been prepared to help you care for yourself upon your return home.   Personal hygiene: Marland Kitchen Use sanitary pads for vaginal drainage, not tampons. . Shower the day after your procedure. . NO tub baths, pools or Jacuzzis for 2-3 weeks. . Wipe front to back after using the bathroom.  Activity and limitations: . Do NOT drive or operate any equipment for 24 hours. The effects of anesthesia are still present and drowsiness may result. . Do NOT rest in bed all day. . Walking is encouraged. . Walk up and down stairs slowly. . You may resume your normal activity in one to two days or as indicated by your physician.  Sexual activity: NO intercourse for at least 2 weeks after the procedure, or as indicated by your physician.  Diet: Eat a light meal as desired this evening. You may resume your usual diet tomorrow.  Return to work: You may resume your work activities in one to two days or as indicated by your doctor.  What to expect after your surgery: Expect to have vaginal bleeding/discharge for 2-3 days and spotting for up to 10 days. It is not unusual to have soreness for up to 1-2 weeks. You may have a slight burning sensation when you urinate for the first day. Mild cramps may continue for a couple of days. You may have a regular period in 2-6 weeks.  Call your doctor for any of the following: . Excessive vaginal bleeding, saturating and changing one pad every hour. . Inability to urinate 6 hours after discharge from hospital. . Pain not relieved by pain medication. . Fever of 100.4 F or greater. . Unusual vaginal discharge or odor.    Post Anesthesia Home Care Instructions  Activity: Get plenty of rest for the  remainder of the day. A responsible individual must stay with you for 24 hours following the procedure.  For the next 24 hours, DO NOT: -Drive a car -Paediatric nurse -Drink alcoholic beverages -Take any medication unless instructed by your physician -Make any legal decisions or sign important papers.  Meals: Start with liquid foods such as gelatin or soup. Progress to regular foods as tolerated. Avoid greasy, spicy, heavy foods. If nausea and/or vomiting occur, drink only clear liquids until the nausea and/or vomiting subsides. Call your physician if vomiting continues.  Special Instructions/Symptoms: Your throat may feel dry or sore from the anesthesia or the breathing tube placed in your throat during surgery. If this causes discomfort, gargle with warm salt water. The discomfort should disappear within 24 hours.  No additional Tylenol/acetaminophen until after 6:00 pm today if needed.

## 2021-04-10 NOTE — Op Note (Signed)
Whitney Kent, ROELL MEDICAL RECORD NO: [id number] ACCOUNT NO: [id number] DATE OF BIRTH: [birth year] FACILITY: [place] LOCATION: [facility] PHYSICIAN: Perla Echavarria A. Garwin Brothers, MD  Operative Report   DATE OF PROCEDURE: 04/09/2021  PREOPERATIVE DIAGNOSES:  Dysfunctional uterine bleeding, endometrial polyp.  PROCEDURES: Diagnostic hysteroscopy, hysteroscopic resection of endometrial polyps, dilation and curettage.  POSTOPERATIVE DIAGNOSIS:  Endometrial polyps, dysfunctional uterine bleeding.  ANESTHESIA:  General.  SURGEON:  Argel Pablo A. Garwin Brothers, MD  ASSISTANT:  None.  DESCRIPTION OF PROCEDURE:  Under adequate general anesthesia, the patient was placed in the dorsal lithotomy position.  She was sterilely prepped and draped in the usual fashion.  The patient had voided prior to entering the room.  Therefore, she was not catheterized.  Examination under anesthesia revealed anteverted uterus.  No adnexal masses could be appreciated.  Bivalve speculum was placed in the vagina.  A single-tooth tenaculum was placed on the anterior lip of the cervix.  The cervix was then  dilated to a #19 Pratt dilator.  Diagnostic hysteroscope was introduced into the uterine cavity.  On entering the cavity, multiple endometrial polyps were noted, particularly on the anterior wall as well as adjacent to the tubal ostia, which were seen bilaterally. Using the [facility] MyoSure resectoscope, all polypoid lesion and the endometrial wall was resected.  The endocervical canal was inspected and no lesions noted.  When all tissue was felt to be completely resected and removed, all  instruments were then removed from the vagina.  Specimen labeled endometrial curettings with polyps were sent to pathology.  Fluid deficit was 100 ml.  Complication was none.  The patient tolerated the procedure well and was transferred to recovery room in  stable condition.   SHW D: 04/10/2021 7:07:44 am T: 04/10/2021 7:28:00 am  JOB: [id number]/  [id number]

## 2021-04-12 ENCOUNTER — Encounter (HOSPITAL_BASED_OUTPATIENT_CLINIC_OR_DEPARTMENT_OTHER): Payer: Self-pay | Admitting: Obstetrics and Gynecology

## 2021-04-12 LAB — SURGICAL PATHOLOGY

## 2021-04-15 ENCOUNTER — Other Ambulatory Visit: Payer: Self-pay

## 2021-04-15 ENCOUNTER — Encounter: Payer: Self-pay | Admitting: Family Medicine

## 2021-04-15 ENCOUNTER — Ambulatory Visit (INDEPENDENT_AMBULATORY_CARE_PROVIDER_SITE_OTHER): Payer: PRIVATE HEALTH INSURANCE | Admitting: Family Medicine

## 2021-04-15 VITALS — BP 102/76 | HR 80 | Temp 97.8°F | Ht 63.25 in | Wt 149.2 lb

## 2021-04-15 DIAGNOSIS — E538 Deficiency of other specified B group vitamins: Secondary | ICD-10-CM

## 2021-04-15 DIAGNOSIS — E049 Nontoxic goiter, unspecified: Secondary | ICD-10-CM | POA: Diagnosis not present

## 2021-04-15 DIAGNOSIS — Z0000 Encounter for general adult medical examination without abnormal findings: Secondary | ICD-10-CM | POA: Diagnosis not present

## 2021-04-15 DIAGNOSIS — M25511 Pain in right shoulder: Secondary | ICD-10-CM

## 2021-04-15 DIAGNOSIS — F902 Attention-deficit hyperactivity disorder, combined type: Secondary | ICD-10-CM | POA: Diagnosis not present

## 2021-04-15 LAB — COMPREHENSIVE METABOLIC PANEL
ALT: 10 U/L (ref 0–35)
AST: 15 U/L (ref 0–37)
Albumin: 4.1 g/dL (ref 3.5–5.2)
Alkaline Phosphatase: 55 U/L (ref 39–117)
BUN: 11 mg/dL (ref 6–23)
CO2: 29 mEq/L (ref 19–32)
Calcium: 9.2 mg/dL (ref 8.4–10.5)
Chloride: 104 mEq/L (ref 96–112)
Creatinine, Ser: 0.76 mg/dL (ref 0.40–1.20)
GFR: 92.86 mL/min (ref >60.00)
Glucose, Bld: 84 mg/dL (ref 70–99)
Potassium: 4.7 mEq/L (ref 3.5–5.1)
Sodium: 138 mEq/L (ref 135–145)
Total Bilirubin: 0.3 mg/dL (ref 0.2–1.2)
Total Protein: 6.7 g/dL (ref 6.0–8.3)

## 2021-04-15 LAB — LIPID PANEL
Cholesterol: 139 mg/dL (ref 0–200)
HDL: 58.3 mg/dL (ref >39.00)
LDL Cholesterol: 58 mg/dL (ref 0–99)
NonHDL: 80.83
Total CHOL/HDL Ratio: 2
Triglycerides: 112 mg/dL (ref 0.0–149.0)
VLDL: 22.4 mg/dL (ref 0.0–40.0)

## 2021-04-15 LAB — T4, FREE: Free T4: 0.86 ng/dL (ref 0.60–1.60)

## 2021-04-15 LAB — TSH: TSH: 0.61 u[IU]/mL (ref 0.35–4.50)

## 2021-04-15 LAB — HEMOGLOBIN A1C: Hgb A1c MFr Bld: 5.5 % (ref 4.6–6.5)

## 2021-04-15 LAB — VITAMIN D 25 HYDROXY (VIT D DEFICIENCY, FRACTURES): VITD: 34.73 ng/mL (ref 30.00–100.00)

## 2021-04-15 LAB — VITAMIN B12: Vitamin B-12: 243 pg/mL (ref 211–911)

## 2021-04-15 MED ORDER — CYANOCOBALAMIN 1000 MCG/ML IJ SOLN
1000.0000 ug | Freq: Once | INTRAMUSCULAR | Status: AC
  Administered 2021-04-15: 1000 ug via INTRAMUSCULAR

## 2021-04-15 NOTE — Patient Instructions (Addendum)
Preventive Care 48-48 Years Old, Female Preventive care refers to lifestyle choices and visits with your health care provider that can promote health and wellness. This includes:  A yearly physical exam. This is also called an annual wellness visit.  Regular dental and eye exams.  Immunizations.  Screening for certain conditions.  Healthy lifestyle choices, such as: ? Eating a healthy diet. ? Getting regular exercise. ? Not using drugs or products that contain nicotine and tobacco. ? Limiting alcohol use. What can I expect for my preventive care visit? Physical exam Your health care provider will check your:  Height and weight. These may be used to calculate your BMI (body mass index). BMI is a measurement that tells if you are at a healthy weight.  Heart rate and blood pressure.  Body temperature.  Skin for abnormal spots. Counseling Your health care provider may ask you questions about your:  Past medical problems.  Family's medical history.  Alcohol, tobacco, and drug use.  Emotional well-being.  Home life and relationship well-being.  Sexual activity.  Diet, exercise, and sleep habits.  Work and work Statistician.  Access to firearms.  Method of birth control.  Menstrual cycle.  Pregnancy history. What immunizations do I need? Vaccines are usually given at various ages, according to a schedule. Your health care provider will recommend vaccines for you based on your age, medical history, and lifestyle or other factors, such as travel or where you work.   What tests do I need? Blood tests  Lipid and cholesterol levels. These may be checked every 5 years, or more often if you are over 3 years old.  Hepatitis C test.  Hepatitis B test. Screening  Lung cancer screening. You may have this screening every year starting at age 48 if you have a 30-pack-year history of smoking and currently smoke or have quit within the past 15 years.  Colorectal cancer  screening. ? All adults should have this screening starting at age 48 and continuing until age 17. ? Your health care provider may recommend screening at age 48 if you are at increased risk. ? You will have tests every 1-10 years, depending on your results and the type of screening test.  Diabetes screening. ? This is done by checking your blood sugar (glucose) after you have not eaten for a while (fasting). ? You may have this done every 1-3 years.  Mammogram. ? This may be done every 1-2 years. ? Talk with your health care provider about when you should start having regular mammograms. This may depend on whether you have a family history of breast cancer.  BRCA-related cancer screening. This may be done if you have a family history of breast, ovarian, tubal, or peritoneal cancers.  Pelvic exam and Pap test. ? This may be done every 3 years starting at age 48. ? Starting at age 11, this may be done every 5 years if you have a Pap test in combination with an HPV test. Other tests  STD (sexually transmitted disease) testing, if you are at risk.  Bone density scan. This is done to screen for osteoporosis. You may have this scan if you are at high risk for osteoporosis. Talk with your health care provider about your test results, treatment options, and if necessary, the need for more tests. Follow these instructions at home: Eating and drinking  Eat a diet that includes fresh fruits and vegetables, whole grains, lean protein, and low-fat dairy products.  Take vitamin and mineral supplements  as recommended by your health care provider.  Do not drink alcohol if: ? Your health care provider tells you not to drink. ? You are pregnant, may be pregnant, or are planning to become pregnant.  If you drink alcohol: ? Limit how much you have to 0-1 drink a day. ? Be aware of how much alcohol is in your drink. In the [place] one drink equals one 12 oz bottle of beer (355 mL), one 5 oz glass of  wine (148 mL), or one 1 oz glass of hard liquor (44 mL).   Lifestyle  Take daily care of your teeth and gums. Brush your teeth every morning and night with fluoride toothpaste. Floss one time each day.  Stay active. Exercise for at least 30 minutes 5 or more days each week.  Do not use any products that contain nicotine or tobacco, such as cigarettes, e-cigarettes, and chewing tobacco. If you need help quitting, ask your health care provider.  Do not use drugs.  If you are sexually active, practice safe sex. Use a condom or other form of protection to prevent STIs (sexually transmitted infections).  If you do not wish to become pregnant, use a form of birth control. If you plan to become pregnant, see your health care provider for a prepregnancy visit.  If told by your health care provider, take low-dose aspirin daily starting at age 48.  Find healthy ways to cope with stress, such as: ? Meditation, yoga, or listening to music. ? Journaling. ? Talking to a trusted person. ? Spending time with friends and family. Safety  Always wear your seat belt while driving or riding in a vehicle.  Do not drive: ? If you have been drinking alcohol. Do not ride with someone who has been drinking. ? When you are tired or distracted. ? While texting.  Wear a helmet and other protective equipment during sports activities.  If you have firearms in your house, make sure you follow all gun safety procedures. What's next?  Visit your health care provider once a year for an annual wellness visit.  Ask your health care provider how often you should have your eyes and teeth checked.  Stay up to date on all vaccines. This information is not intended to replace advice given to you by your health care provider. Make sure you discuss any questions you have with your health care provider. Document Revised: 09/08/2020 Document Reviewed: 08/16/2018 Elsevier Patient Education  2021 [place] With Attention Deficit Hyperactivity Disorder If you have been diagnosed with attention deficit hyperactivity disorder (ADHD), you may be relieved that you now know why you have felt or behaved a certain way. Still, you may feel overwhelmed about the treatment ahead. You may also wonder how to get the support you need and how to deal with the condition day-to-day. With treatment and support, you can live with ADHD and manage your symptoms. How to manage lifestyle changes Managing stress Stress is your body's reaction to life changes and events, both good and bad. To cope with the stress of an ADHD diagnosis, it may help to:  Learn more about ADHD.  Exercise regularly. Even a short daily walk can lower stress levels.  Participate in training or education programs (including social skills training classes) that teach you to deal with symptoms.   Medicines Your health care provider may suggest certain medicines if he or she feels that they will help to improve your condition. Stimulant medicines are usually  prescribed to treat ADHD, and therapy may also be prescribed. It is important to:  Avoid using alcohol and other substances that may prevent your medicines from working properly (may interact).  Talk with your pharmacist or health care provider about all the medicines that you take, their possible side effects, and what medicines are safe to take together.  Make it your goal to take part in all treatment decisions (shared decision-making). Ask about possible side effects of medicines that your health care provider recommends, and tell him or her how you feel about having those side effects. It is best if shared decision-making with your health care provider is part of your total treatment plan. Relationships To strengthen your relationships with family members while treating your condition, consider taking part in family therapy. You might also attend self-help groups alone or with  a loved one. Be honest about how your symptoms affect your relationships. Make an effort to communicate respectfully instead of fighting, and find ways to show others that you care. Psychotherapy may be useful in helping you cope with how ADHD affects your relationships. How to recognize changes in your condition The following signs may mean that your treatment is working well and your condition is improving:  Consistently being on time for appointments.  Being more organized at home and work.  Other people noticing improvements in your behavior.  Achieving goals that you set for yourself.  Thinking more clearly. The following signs may mean that your treatment is not working very well:  Feeling impatience or more confusion.  Missing, forgetting, or being late for appointments.  An increasing sense of disorganization and messiness.  More difficulty in reaching goals that you set for yourself.  Loved ones becoming angry or frustrated with you. Follow these instructions at home:  Take over-the-counter and prescription medicines only as told by your health care provider. Check with your health care provider before taking any new medicines.  Create structure and an organized atmosphere at home. For example: ? Make a list of tasks, then rank them from most important to least important. Work on one task at a time until your listed tasks are done. ? Make a daily schedule and follow it consistently every day. ? Use an appointment calendar, and check it 2 or 3 times a day to keep on track. Keep it with you when you leave the house. ? Create spaces where you keep certain things, and always put things back in their places after you use them.  Keep all follow-up visits as told by your health care provider. This is important. Where to find support Talking to others  Keep emotion out of important discussions and speak in a calm, logical way.  Listen closely and patiently to your loved  ones. Try to understand their point of view, and try to avoid getting defensive.  Take responsibility for the consequences of your actions.  Ask that others do not take your behaviors personally.  Aim to solve problems as they come up, and express your feelings instead of bottling them up.  Talk openly about what you need from your loved ones and how they can support you.  Consider going to family therapy sessions or having your family meet with a specialist who deals with ADHD-related behavior problems.   Finances Not all insurance plans cover mental health care, so it is important to check with your insurance carrier. If paying for co-pays or counseling services is a problem, search for a local or county  mental health care center. Public mental health care services may be offered there at a low cost or no cost when you are not able to see a private health care provider. If you are taking medicine for ADHD, you may be able to get the generic form, which may be less expensive than brand-name medicine. Some makers of prescription medicines also offer help to patients who cannot afford the medicines that they need. Questions to ask your health care provider:  What are the risks and benefits of taking medicines?  Would I benefit from therapy?  How often should I follow up with a health care provider? Contact a health care provider if:  You have side effects from your medicines, such as: ? Repeated muscle twitches, coughing, or speech outbursts. ? Sleep problems. ? Loss of appetite. ? Depression. ? New or worsening behavior problems. ? Dizziness. ? Unusually fast heartbeat. ? Stomach pains. ? Headaches. Get help right away if:  You have a severe reaction to a medicine.  Your behavior suddenly gets worse. Summary  With treatment and support, you can live with ADHD and manage your symptoms.  The medicines that are most often prescribed for ADHD are stimulants.  Consider taking  part in family therapy or self-help groups with family members or friends.  When you talk with friends and family about your ADHD, be patient and communicate openly.  Take over-the-counter and prescription medicines only as told by your health care provider. Check with your health care provider before taking any new medicines. This information is not intended to replace advice given to you by your health care provider. Make sure you discuss any questions you have with your health care provider. Document Revised: 05/20/2020 Document Reviewed: 05/20/2020 Elsevier Patient Education  2021 [employer].

## 2021-04-15 NOTE — Telephone Encounter (Signed)
Paper printed off at visit, completed and returned to patient.

## 2021-04-15 NOTE — Progress Notes (Signed)
Subjective:     Whitney Kent is a 50 y.o. female and is here for a comprehensive physical exam. The patient reports doing well.  Pt keeping busy with work at [employer].  Pt doing art classes for people.  Pt recently had testing at [facility] which was positive for ADHD. Pt to upload results to mychart.  Inquires about medication options.  Pt went to PT for R shoulder.  Had dry needling which caused more pain.  Used ice and ibuprofen.  Put PT on hold due to other health issues.  Pt had D&C last wk with OB/Gyn for AUB.  Doing ok since procedure.  Using [employer] and fiton apps to increase physical activity/exercise.  Social History   Socioeconomic History  . Marital status: Divorced    Spouse name: Not on file  . Number of children: 2  . Years of education: 57  . Highest education level: Some college, no degree  Occupational History  . Not on file  Tobacco Use  . Smoking status: Never Smoker  . Smokeless tobacco: Never Used  Vaping Use  . Vaping Use: Never used  Substance and Sexual Activity  . Alcohol use: Yes    Comment: ocas  . Drug use: No  . Sexual activity: Yes    Partners: Male    Birth control/protection: Condom  Other Topics Concern  . Not on file  Social History Narrative   Lives at home with her daughter   Right handed   Drinks occasional caffeine   Social Determinants of Health   Financial Resource Strain: Not on file  Food Insecurity: Not on file  Transportation Needs: Not on file  Physical Activity: Not on file  Stress: Not on file  Social Connections: Not on file  Intimate Partner Violence: Not on file   Health Maintenance  Topic Date Due  . Hepatitis C Screening  Never done  . TETANUS/TDAP  Never done  . PAP SMEAR-Modifier  09/26/2020  . INFLUENZA VACCINE  07/19/2021  . COLONOSCOPY (Pts 45-67yrs Insurance coverage will need to be confirmed)  11/21/2027  . COVID-19 Vaccine  Completed  . HIV Screening  Completed  .  HPV VACCINES  Aged Out    The following portions of the patient's history were reviewed and updated as appropriate: allergies, current medications, past family history, past medical history, past social history, past surgical history and problem list.  Review of Systems Pertinent items noted in HPI and remainder of comprehensive ROS otherwise negative.   Objective:    BP 102/76 (BP Location: Left Arm, Patient Position: Sitting, Cuff Size: Normal)   Pulse 80   Temp 97.8 F (36.6 C) (Oral)   Ht 5' 3.25" (1.607 m)   Wt 149 lb 3.2 oz (67.7 kg)   SpO2 90%   BMI 26.22 kg/m  General appearance: alert, cooperative and no distress Head: Normocephalic, without obvious abnormality, atraumatic Eyes: conjunctivae/corneas clear. PERRL, EOM's intact. Fundi benign. Ears: normal TM's and external ear canals both ears Nose: Nares normal. Septum midline. Mucosa normal. No drainage or sinus tenderness. Throat: lips, mucosa, and tongue normal; teeth and gums normal Neck: no adenopathy, no carotid bruit, no JVD, supple, symmetrical, trachea midline and mild symmetric enlargement of thyroid b/l, no nodules appreciated. Lungs: clear to auscultation bilaterally Heart: regular rate and rhythm, S1, S2 normal, no murmur, click, rub or gallop Abdomen: soft, non-tender; bowel sounds normal; no masses,  no organomegaly Extremities: FROM of UEs b/l. extremities normal, atraumatic,  no cyanosis or edema Pulses: 2+ and symmetric Skin: Skin color, texture, turgor normal. No rashes or lesions Lymph nodes: Cervical, supraclavicular, and axillary nodes normal. Neurologic: Alert and oriented X 3, normal strength and tone. Normal symmetric reflexes. Normal coordination and gait    Assessment:    Healthy female exam with goiter and R shoulder pain.     Plan:     Anticipatory guidance given including wearing seatbelts, smoke detectors in the home, increasing physical activity, increasing p.o. intake of water and  vegetables. -will obtain labs -mammogram up to date, done 04/21/20 -pap done by OB/Gyn -colonoscopy up to date, done 13/02/2017 -given handout -next CPE in 1 yr See After Visit Summary for Counseling Recommendations    Acute pain of right shoulder -treatment of symptoms including heat, ice, stretching, topical analgesics, NSAIDs or tylenol -revisit PT -dicussed obtaining imaging of R shoulder to evaluate for arthrtiis  Vitamin B 12 deficiency  - Plan: Vitamin B12, cyanocobalamin ((VITAMIN B-12)) injection 1,000 mcg  Goiter  - Plan: TSH, T4, Free  Attention deficit hyperactivity disorder (ADHD), combined type -discussed various treatment options -given handout -will have pt provide testing results from [facility] eval and f/u in regards to starting medication  F/u in 1 month  Grier Mitts, MD

## 2021-04-16 NOTE — Progress Notes (Signed)
Spoke with patient, is aware of results. Stated will make appointments for B12 injections.

## 2021-04-25 ENCOUNTER — Encounter: Payer: Self-pay | Admitting: Family Medicine

## 2021-04-25 DIAGNOSIS — M25511 Pain in right shoulder: Secondary | ICD-10-CM | POA: Insufficient documentation

## 2021-04-25 DIAGNOSIS — M25519 Pain in unspecified shoulder: Secondary | ICD-10-CM | POA: Insufficient documentation

## 2021-04-25 DIAGNOSIS — F902 Attention-deficit hyperactivity disorder, combined type: Secondary | ICD-10-CM | POA: Insufficient documentation

## 2021-04-25 DIAGNOSIS — E538 Deficiency of other specified B group vitamins: Secondary | ICD-10-CM | POA: Insufficient documentation

## 2021-04-26 ENCOUNTER — Encounter: Payer: Self-pay | Admitting: Family Medicine

## 2021-04-27 ENCOUNTER — Ambulatory Visit: Payer: Self-pay

## 2021-04-27 ENCOUNTER — Ambulatory Visit (INDEPENDENT_AMBULATORY_CARE_PROVIDER_SITE_OTHER)
Admission: RE | Admit: 2021-04-27 | Discharge: 2021-04-27 | Disposition: A | Discharge status: Home / Self Care | Payer: PRIVATE HEALTH INSURANCE | Source: Ambulatory Visit | Attending: Family Medicine | Admitting: Family Medicine

## 2021-04-27 ENCOUNTER — Encounter: Payer: Self-pay | Admitting: Family Medicine

## 2021-04-27 ENCOUNTER — Other Ambulatory Visit: Payer: Self-pay

## 2021-04-27 ENCOUNTER — Ambulatory Visit
Admission: RE | Admit: 2021-04-27 | Discharge: 2021-04-27 | Disposition: A | Discharge status: Home / Self Care | Payer: PRIVATE HEALTH INSURANCE | Source: Ambulatory Visit | Attending: Family Medicine | Admitting: Family Medicine

## 2021-04-27 ENCOUNTER — Ambulatory Visit: Payer: PRIVATE HEALTH INSURANCE | Admitting: Family Medicine

## 2021-04-27 VITALS — BP 110/70 | HR 95 | Ht 63.25 in | Wt 151.0 lb

## 2021-04-27 DIAGNOSIS — M25511 Pain in right shoulder: Secondary | ICD-10-CM

## 2021-04-27 DIAGNOSIS — R202 Paresthesia of skin: Secondary | ICD-10-CM

## 2021-04-27 DIAGNOSIS — G8929 Other chronic pain: Secondary | ICD-10-CM

## 2021-04-27 DIAGNOSIS — M7052 Other bursitis of knee, left knee: Secondary | ICD-10-CM

## 2021-04-27 NOTE — Progress Notes (Signed)
I, Wendy Poet, LAT, ATC, am serving as scribe for Dr. Lynne Leader.  Whitney Kent is a 50 y.o. female who presents to [place] [facility] today for f/u of R shoulder pain.  She was last seen by Dr. Georgina Snell on 02/17/21 and was referred to PT of which she's completed 3 sessions.  Since her last visit, pt reports that her R shoulder is feeling worse.  She had to have a recent gynecological surgery so that was taking precedence over her shoulder.  She has been doing her HEP intermittently since her last visit.  She states that PT did dry needling at her last visit and she feels that this made her pain worse overall.  She is experiencing some catching in her R shoulder.  She con't to have difficulty w/ sleeping due to pain w/ positioning.  She states that she was taking IBU prior to her gynecological surgery but is no longer taking it so perhaps the IBU was masking her pain a bit.  She locates her pain to her R upper arm (lateral and ant), R elbow and R thumb.  She has returned to her office for work and now has a standing desk.  She has been using ice intermittently and has been using a topical pain relieving cream/rub.   Pertinent review of systems: No fevers or chills  Relevant historical information: Uterine polyp   Exam:  BP 110/70 (BP Location: Right Arm, Patient Position: Sitting, Cuff Size: Normal)   Pulse 95   Ht 5' 3.25" (1.607 m)   Wt 151 lb (68.5 kg)   LMP 04/20/2021   SpO2 98%   BMI 26.54 kg/m  General: Well Developed, well nourished, and in no acute distress.   MSK: Right shoulder normal-appearing Nontender. Normal motion pain with abduction. Positive Hawkins and Neer's test positive empty can test Intact strength.    Lab and Radiology Results  X-ray images right shoulder obtained today personally and independently interpreted No acute fractures.  No significant degenerative changes. Await formal radiology review    Assessment and  Plan: 50 y.o. female with right shoulder pain thought to be due to subacromial bursitis and rotator cuff tendinopathy.  Patient is failing conservative management with trial of physical therapy and home exercise program over the last 2 months.  Plan for MRI to further characterize cause of pain and for potential treatment plan including injection or surgery.  Recheck after MRI.   PDMP not reviewed this encounter. Orders Placed This Encounter  Procedures  . DG Shoulder Right    Standing Status:   Future    Number of Occurrences:   1    Standing Expiration Date:   04/27/2022    Order Specific Question:   Reason for Exam (SYMPTOM  OR DIAGNOSIS REQUIRED)    Answer:   eval shoulder pain    Order Specific Question:   Is patient pregnant?    Answer:   No    Order Specific Question:   Preferred imaging location?    Answer:   [facility]  . MR SHOULDER RIGHT WO CONTRAST    Standing Status:   Future    Standing Expiration Date:   04/27/2022    Order Specific Question:   What is the patient's sedation requirement?    Answer:   No Sedation    Order Specific Question:   Does the patient have a pacemaker or implanted devices?    Answer:   No  Order Specific Question:   Preferred imaging location?    Answer:   Product/process development scientist (table limit-350lbs)   No orders of the defined types were placed in this encounter.    Discussed warning signs or symptoms. Please see discharge instructions. Patient expresses understanding.   The above documentation has been reviewed and is accurate and complete Lynne Leader, M.D.

## 2021-04-27 NOTE — Patient Instructions (Addendum)
Thank you for coming in today.  Please get an Xray today before you leave  You should hear from MRI scheduling within 1 week. If you do not hear please let me know.   Recheck after the MRI.   Let me know sooner if you have a problem.

## 2021-04-29 NOTE — Progress Notes (Signed)
Right shoulder x-ray looks normal to radiology

## 2021-05-23 ENCOUNTER — Other Ambulatory Visit: Payer: Self-pay

## 2021-05-23 ENCOUNTER — Ambulatory Visit (INDEPENDENT_AMBULATORY_CARE_PROVIDER_SITE_OTHER): Payer: PRIVATE HEALTH INSURANCE

## 2021-05-23 DIAGNOSIS — M25511 Pain in right shoulder: Secondary | ICD-10-CM

## 2021-05-23 DIAGNOSIS — G8929 Other chronic pain: Secondary | ICD-10-CM

## 2021-05-24 NOTE — Progress Notes (Signed)
Right shoulder MRI shows mild rotator cuff tendinitis but no tears are visible.  Concern for posterior labrum tear based on MRI.  Return to clinic to go over the results in full detail.

## 2021-05-26 NOTE — Progress Notes (Signed)
I, Peterson Lombard, LAT, ATC acting as a scribe for Lynne Leader, MD.  Whitney Kent is a 50 y.o. female who presents to [place] at [facility] today for f/u R shoulder pain and MRI review. Pt was last seen by Dr. Georgina Snell on  and was advised to proceed to MRI to further characterize cause of pain. Today, pt reports constant low dull pain w/ acute extreme pain w/ certain motions. Pt notes her fingers/wrist will go numb/tingling intermittently.  Patient is interested in fixing the problem definitively and he would be willing to proceed to surgery.  She would like to avoid temporarily controlling the problem with injections.  Dx imaging: 05/23/21 R shoulder MRI  04/27/21 R shoulder XR  Pertinent review of systems: No fevers or chills  Relevant historical information: Migraine history   Exam:  BP 128/86 (BP Location: Left Arm, Patient Position: Sitting, Cuff Size: Normal)   Pulse 91   Ht 5' 3.25" (1.607 m)   Wt 151 lb 9.6 oz (68.8 kg)   SpO2 99%   BMI 26.64 kg/m  General: Well Developed, well nourished, and in no acute distress.   MSK: Right shoulder Normal-appearing Limited range of motion to abduction and external rotation. Positive Hawkins and Neer's test.  Positive empty can test.  Negative O'Brien test. Positive apprehension test. Negative Yergason's and speeds test. Intact strength.    Lab and Radiology Results  EXAM: MRI OF THE RIGHT SHOULDER WITHOUT CONTRAST   TECHNIQUE: Multiplanar, multisequence MR imaging of the shoulder was performed. No intravenous contrast was administered.   COMPARISON:  Radiographs 04/27/2021   FINDINGS: Rotator cuff: Mild rotator cuff tendinopathy/tendinosis but no partial or full-thickness rotator cuff tear.   Muscles:  Unremarkable.   Biceps long head:  Intact   Acromioclavicular Joint: Minimal degenerative changes. Type 1 acromion. No lateral downsloping or subacromial spurring.   Glenohumeral Joint:  Mild degenerative changes and small joint effusion.   Labrum: Suspect posterior labral tear. The anterior and superior labrum appear intact.   Bones:  No acute bony findings. No bone lesions.   Other: No subacromial/subdeltoid fluid collections to suggest bursitis.   IMPRESSION: 1. Mild rotator cuff tendinopathy/tendinosis but no partial or full-thickness rotator cuff tear. 2. Intact long head biceps tendon. 3. Suspect posterior labral tear. 4. No significant findings for bony impingement.     Electronically Signed   By: Marijo Sanes M.D.   On: 05/23/2021 15:19   I, Lynne Leader, personally (independently) visualized and performed the interpretation of the images attached in this note.       Assessment and Plan: 50 y.o. female with right shoulder pain chronic and ongoing for several months failing conservative management including physical therapy.  Noncontrast MRI concerning for rotator cuff tendinopathy however also somewhat puzzling concerning for a posterior labral tear.  Patient does have some labral pathology signs on physical exam today.  Discussed options.  Patient is not interested in proceeding with injections and would like to proceed to a more definitive treatment option including surgery.  I think this is reasonable and will refer her to orthopedic surgery for surgical consultation and discussion.   PDMP not reviewed this encounter. Orders Placed This Encounter  Procedures   Ambulatory referral to Orthopedic Surgery    Referral Priority:   Routine    Referral Type:   Surgical    Referral Reason:   Specialty Services Required    Requested Specialty:   Orthopedic Surgery    Number of  Visits Requested:   1   No orders of the defined types were placed in this encounter.    Discussed warning signs or symptoms. Please see discharge instructions. Patient expresses understanding.   The above documentation has been reviewed and is accurate and complete Lynne Leader, M.D.  Total encounter time 20 minutes including face-to-face time with the patient and, reviewing past medical record, and charting on the date of service.   MRI findings treatment options

## 2021-05-27 ENCOUNTER — Other Ambulatory Visit: Payer: Self-pay

## 2021-05-27 ENCOUNTER — Ambulatory Visit (INDEPENDENT_AMBULATORY_CARE_PROVIDER_SITE_OTHER): Payer: PRIVATE HEALTH INSURANCE | Admitting: Family Medicine

## 2021-05-27 VITALS — BP 128/86 | HR 91 | Ht 63.25 in | Wt 151.6 lb

## 2021-05-27 DIAGNOSIS — M25511 Pain in right shoulder: Secondary | ICD-10-CM | POA: Diagnosis not present

## 2021-05-27 DIAGNOSIS — G8929 Other chronic pain: Secondary | ICD-10-CM | POA: Diagnosis not present

## 2021-05-27 NOTE — Patient Instructions (Signed)
Thank you for coming in today.   You should hear from orthopedics soon.   You could call them yourself.   Let me know if you have a problem.

## 2021-05-31 ENCOUNTER — Telehealth: Payer: Self-pay | Admitting: Family Medicine

## 2021-05-31 ENCOUNTER — Other Ambulatory Visit: Payer: Self-pay

## 2021-05-31 NOTE — Telephone Encounter (Signed)
Patient called stating that she would like to be referred to Dr Justice Britain at [facility] instead of [facility].  Can this be put in for her please? (She already has an appointment scheduled with them)

## 2021-06-01 NOTE — Telephone Encounter (Signed)
Referral sent to [facility]  

## 2021-06-01 NOTE — Addendum Note (Signed)
Addended by: Judy Pimple R on: 06/01/2021 12:02 PM   Modules accepted: Orders

## 2021-06-02 ENCOUNTER — Ambulatory Visit: Payer: PRIVATE HEALTH INSURANCE | Admitting: Orthopaedic Surgery

## 2021-06-24 ENCOUNTER — Telehealth: Payer: Self-pay | Admitting: Family Medicine

## 2021-06-24 NOTE — Telephone Encounter (Signed)
Pt is calling w/two request she is wanting to know if she can start on the ADHD medication of Adderall   Pharm:  [facility] [id number].  Pt is calling to see if she can get the order for the xray for her leg renewed due to her letting the other one expire.

## 2021-06-28 NOTE — Telephone Encounter (Signed)
Called patient to inform an appointment is needed, no answer. Left VM for her to call office and schedule appointment.

## 2021-07-01 IMAGING — MR MR SHOULDER*R* W/O CM
5 series · 40 of 40 positions shown · non-contrast
Comparison: Radiographs 04/27/2021

CLINICAL DATA: Right shoulder pain for 1 year.

EXAM:
MRI OF THE RIGHT SHOULDER WITHOUT CONTRAST
TECHNIQUE: Multiplanar, multisequence MR imaging of the shoulder was performed.
No intravenous contrast was administered.

[Series 3: T2 fat-sat · axial · 4.0mm · 0.59mm/px · z∈[+2,+76]mm · 8 of 18 slices shown (1 of 3)]
[im 1/18]
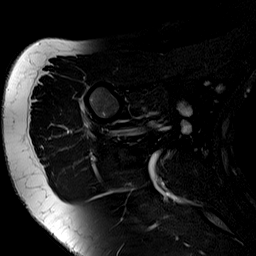
[im 3/18]
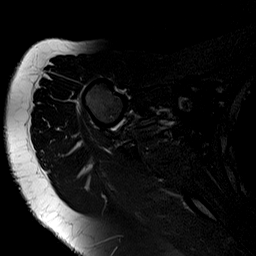
[im 5/18]
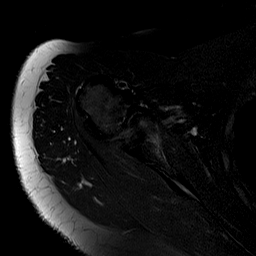
[im 8/18]
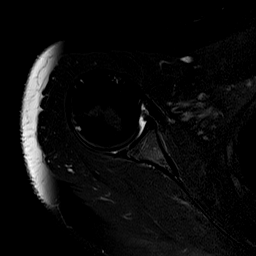
[im 10/18]
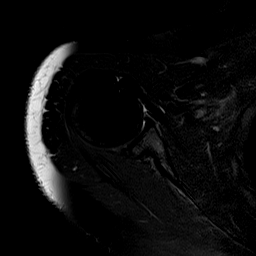
[im 13/18]
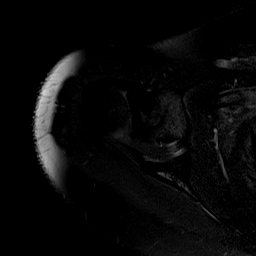
[im 15/18]
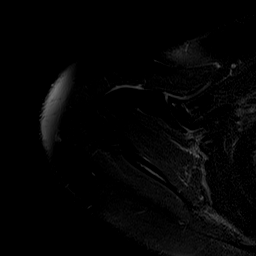
[im 18/18]
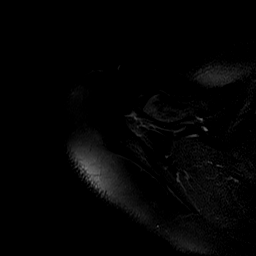

[Series 4: T2 fat-sat · oblique · 4.0mm · 0.59mm/px · 7 of 17 slices shown (2 of 3)]
[im 1/17]
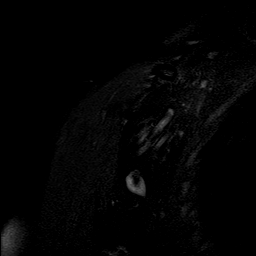
[im 3/17]
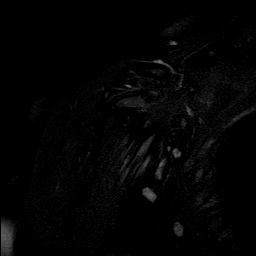
[im 6/17]
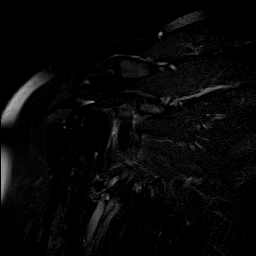
[im 9/17]
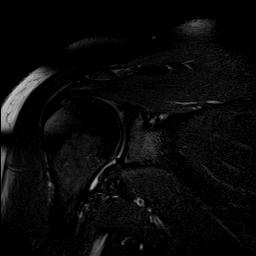
[im 11/17]
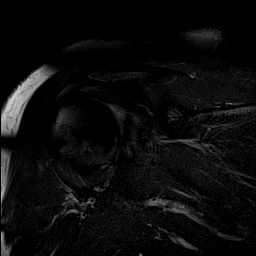
[im 14/17]
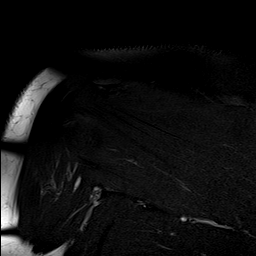
[im 17/17]
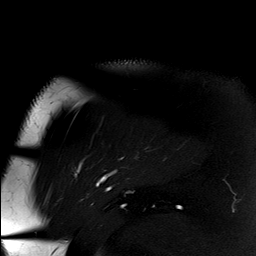

[Series 5: PD · oblique · 4.0mm · 0.59mm/px · 7 of 17 slices shown]
[im 1/17]
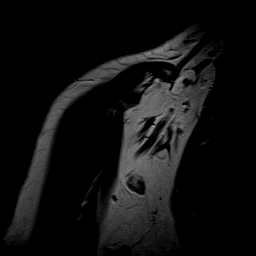
[im 3/17]
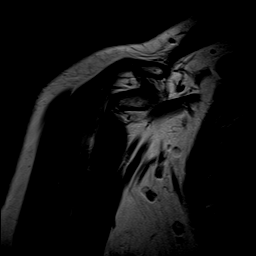
[im 6/17]
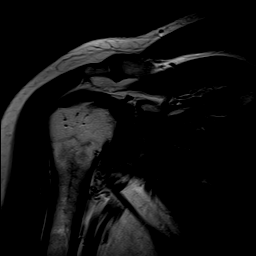
[im 9/17]
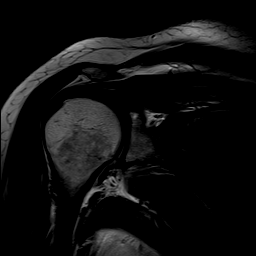
[im 11/17]
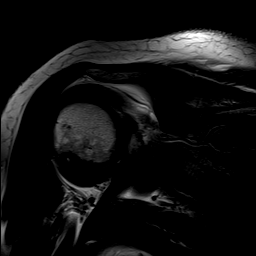
[im 14/17]
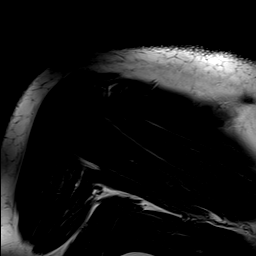
[im 17/17]
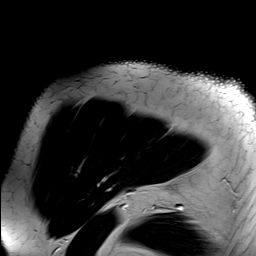

[Series 6: T1 · oblique · 4.0mm · 0.59mm/px · 9 of 20 slices shown]
[im 1/20]
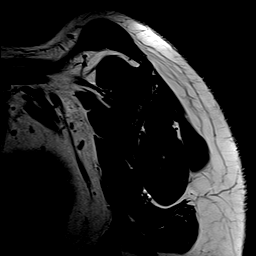
[im 3/20]
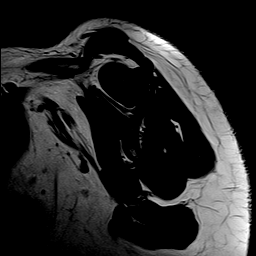
[im 5/20]
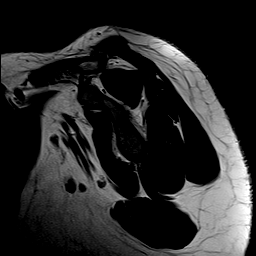
[im 8/20]
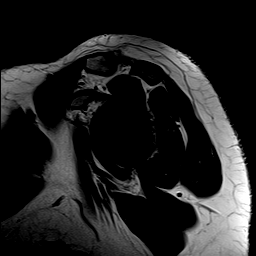
[im 10/20]
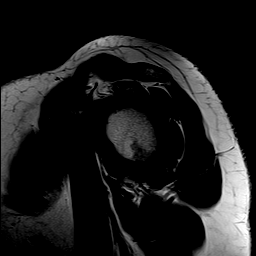
[im 12/20]
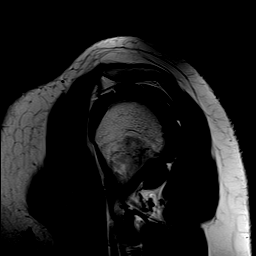
[im 15/20]
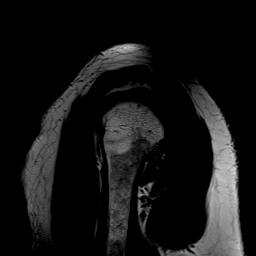
[im 17/20]
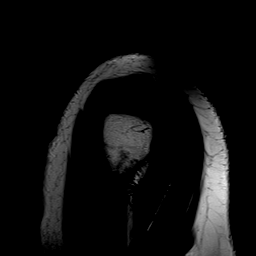
[im 20/20]
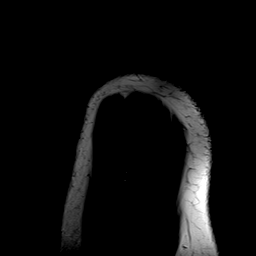

[Series 7: T2 fat-sat · oblique · 4.0mm · 0.59mm/px · 9 of 20 slices shown (3 of 3)]
[im 1/20]
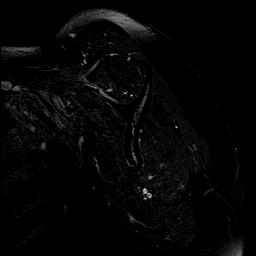
[im 3/20]
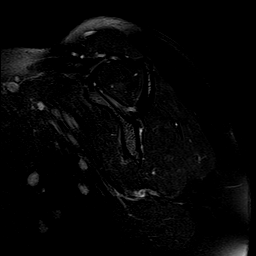
[im 5/20]
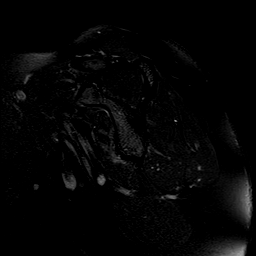
[im 8/20]
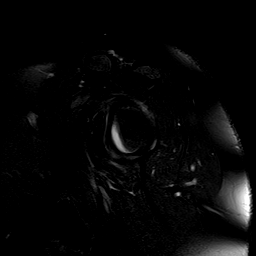
[im 10/20]
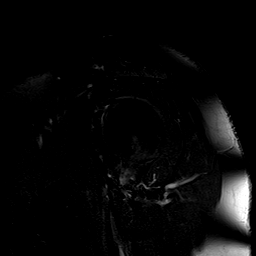
[im 12/20]
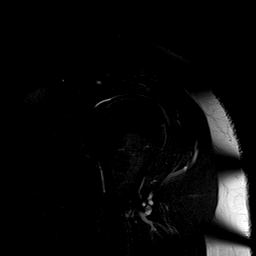
[im 15/20]
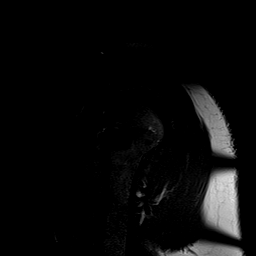
[im 17/20]
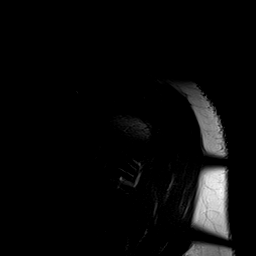
[im 20/20]
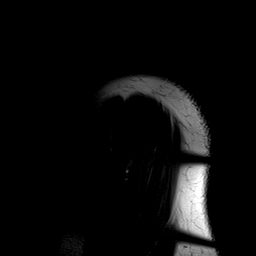

[40 of 40 positions shown; findings below may reference images not displayed]

FINDINGS: Rotator cuff: Mild rotator cuff tendinopathy/tendinosis but no
partial or full-thickness rotator cuff tear.

Muscles:  Unremarkable.

Biceps long head:  Intact

Acromioclavicular Joint: Minimal degenerative changes. Type 1
acromion. No lateral downsloping or subacromial spurring.

Glenohumeral Joint: Mild degenerative changes and small joint
effusion.

Labrum: Suspect posterior labral tear. The anterior and superior
labrum appear intact.

Bones:  No acute bony findings. No bone lesions.

Other: No subacromial/subdeltoid fluid collections to suggest
bursitis.
IMPRESSION: 1. Mild rotator cuff tendinopathy/tendinosis but no partial or
full-thickness rotator cuff tear.
2. Intact long head biceps tendon.
3. Suspect posterior labral tear.
4. No significant findings for bony impingement.

## 2021-07-02 ENCOUNTER — Other Ambulatory Visit: Payer: Self-pay

## 2021-07-02 ENCOUNTER — Ambulatory Visit (INDEPENDENT_AMBULATORY_CARE_PROVIDER_SITE_OTHER): Payer: PRIVATE HEALTH INSURANCE

## 2021-07-02 DIAGNOSIS — E538 Deficiency of other specified B group vitamins: Secondary | ICD-10-CM

## 2021-07-02 MED ORDER — CYANOCOBALAMIN 1000 MCG/ML IJ SOLN
1000.0000 ug | Freq: Once | INTRAMUSCULAR | Status: AC
  Administered 2021-07-02: 1000 ug via INTRAMUSCULAR

## 2021-07-02 NOTE — Progress Notes (Signed)
Per orders of Dr. Jordan, injection of B12 given by Chenise Mulvihill E Luticia Tadros. Patient tolerated injection well.  

## 2021-07-05 ENCOUNTER — Ambulatory Visit (INDEPENDENT_AMBULATORY_CARE_PROVIDER_SITE_OTHER): Payer: 59 | Admitting: Family Medicine

## 2021-07-05 ENCOUNTER — Encounter: Payer: Self-pay | Admitting: Family Medicine

## 2021-07-05 ENCOUNTER — Other Ambulatory Visit: Payer: Self-pay

## 2021-07-05 VITALS — BP 102/68 | HR 90 | Temp 98.1°F | Wt 150.2 lb

## 2021-07-05 DIAGNOSIS — R202 Paresthesia of skin: Secondary | ICD-10-CM

## 2021-07-05 DIAGNOSIS — F411 Generalized anxiety disorder: Secondary | ICD-10-CM | POA: Diagnosis not present

## 2021-07-05 DIAGNOSIS — M7501 Adhesive capsulitis of right shoulder: Secondary | ICD-10-CM

## 2021-07-05 DIAGNOSIS — F419 Anxiety disorder, unspecified: Secondary | ICD-10-CM

## 2021-07-05 DIAGNOSIS — K219 Gastro-esophageal reflux disease without esophagitis: Secondary | ICD-10-CM

## 2021-07-05 DIAGNOSIS — K589 Irritable bowel syndrome without diarrhea: Secondary | ICD-10-CM

## 2021-07-05 DIAGNOSIS — F902 Attention-deficit hyperactivity disorder, combined type: Secondary | ICD-10-CM

## 2021-07-05 DIAGNOSIS — E538 Deficiency of other specified B group vitamins: Secondary | ICD-10-CM

## 2021-07-05 DIAGNOSIS — F32A Depression, unspecified: Secondary | ICD-10-CM

## 2021-07-05 DIAGNOSIS — F3342 Major depressive disorder, recurrent, in full remission: Secondary | ICD-10-CM

## 2021-07-05 DIAGNOSIS — F4310 Post-traumatic stress disorder, unspecified: Secondary | ICD-10-CM

## 2021-07-05 MED ORDER — BUPROPION HCL 100 MG PO TABS: 100.0000 mg | ORAL_TABLET | Freq: Two times a day (BID) | ORAL | 1 refills | Status: DC

## 2021-07-05 MED ORDER — OMEPRAZOLE 20 MG PO CPDR: 20.0000 mg | DELAYED_RELEASE_CAPSULE | Freq: Every day | ORAL | 1 refills | Status: DC

## 2021-07-05 NOTE — Patient Instructions (Signed)
I sent in the prescription for the Wellbutrin 100 mg twice a day.  It can sometime also help with ADHD.  Given this I will hold off on sending in the prescription for Adderall.  If you still notice ADHD symptoms after starting the Wellbutrin I can send it in for you.

## 2021-07-05 NOTE — Progress Notes (Signed)
Subjective:    Patient ID: Whitney Kent, female    DOB: 17-Feb-1971, 50 y.o.   MRN: [id number]  Chief Complaint  Patient presents with   Follow-up    Medication   Leg Pain    Sharp pain, like stepped on something, randomly happens and goes up the calf. Still has stiffness and bursitis feeling    HPI Patient was seen today for f/u.  Pt had ADHD eval at [facility] on 02/10/21, recommendations include medication and test accommodations.  Also noted pt met criteria for GAD, PTSD, and h/o MDD.  Interested in starting medication.  Requesting refills.  Having a "weird pain" in LLE, like a shock with stepping on L foot and intermittently in L shin.  Followed by GNS, Dr. Jaynee Eagles in the past.  R shoulder with adhesive capsulitis.  Had a Cortisone injection at Ortho.  Followed by GI, Dr, Havery Moros for IBS.    Past Medical History:  Diagnosis Date   ADHD dx feb 2022   no meds for    Allergy    Anemia    Anxiety    Family history of adverse reaction to anesthesia    daughter gets hiccups after anesthesia   GERD (gastroesophageal reflux disease)    Goiter right neck   dr Dwyane Dee follows lov 01-28-2021   last [place] 04-03-2018   Hemiplegic migraine    pt dx 2004.  Pt had 1 seizure after this dx.  Not sure if related to dx or not. maw   History of asthma    none since age 89   History of depression    Seizures ([place])    2004. only 1 seizure   Stroke ([place]) 2019   mild no residual deficit found on mri   Syncope    occ with migraine fatigue with aura and migraine   Vitamin D deficiency     No Known Allergies  ROS General: Denies fever, chills, night sweats, changes in weight, changes in appetite HEENT: Denies headaches, ear pain, changes in vision, rhinorrhea, sore throat CV: Denies CP, palpitations, SOB, orthopnea Pulm: Denies SOB, cough, wheezing GI: Denies abdominal pain, nausea, vomiting, diarrhea, constipation GU: Denies dysuria, hematuria, frequency, vaginal discharge Msk: Denies muscle  cramps, joint pains  +LLE pain, R shoulder pain Neuro: Denies weakness, numbness, tingling Skin: Denies rashes, bruising Psych: Denies depression, anxiety, hallucinations  +inattention, anxiety    Objective:    Blood pressure 102/68, pulse 90, temperature 98.1 F (36.7 C), temperature source Oral, weight 150 lb 3.2 oz (68.1 kg), SpO2 97 %.   Gen. Pleasant, well-nourished, in no distress, normal affect   HEENT: [place]/AT, face symmetric, conjunctiva clear, no scleral icterus, PERRLA, EOMI, nares patent without drainage Lungs: no accessory muscle use, CTAB, no wheezes or rales Cardiovascular: RRR, no m/r/g, no peripheral edema Abdomen: BS present, soft, NT/ND. Musculoskeletal: No deformities, no cyanosis or clubbing, normal tone Neuro:  A&Ox3, CN II-XII intact, normal gait Skin:  Warm, no lesions/ rash  Wt Readings from Last 3 Encounters:  07/05/21 150 lb 3.2 oz (68.1 kg)  05/27/21 151 lb 9.6 oz (68.8 kg)  04/27/21 151 lb (68.5 kg)    Lab Results  Component Value Date   WBC 6.9 04/09/2021   HGB 13.1 04/09/2021   HCT 41.5 04/09/2021   PLT 248 04/09/2021   GLUCOSE 84 04/15/2021   CHOL 139 04/15/2021   TRIG 112.0 04/15/2021   HDL 58.30 04/15/2021   LDLCALC 58 04/15/2021   ALT 10 04/15/2021  AST 15 04/15/2021   NA 138 04/15/2021   K 4.7 04/15/2021   CL 104 04/15/2021   CREATININE 0.76 04/15/2021   BUN 11 04/15/2021   CO2 29 04/15/2021   TSH 0.61 04/15/2021   HGBA1C 5.5 04/15/2021   Assessment/Plan:  Attention deficit hyperactivity disorder (ADHD), combined type  -s/p eval at [facility] at [facility].  BDEFS Adult Form total raw score for EF summary was 42 placing her in the 90th percentile range.   ADHD EF indx scoring raw score 13, placing her in the 98th percentile range.  Recommendations include school accommodations such as extra time on test and testing private location.  Also consider CBT and medication.   -report to be scanned into system. -discussed starting  Adderall or other med for ADHD.  As pt would like to start medication for anxiety will wait 4-6 wks before starting Adderall.  -May find some ADHD symptom  relief with wellbutrin. - Plan: buPROPion (WELLBUTRIN) 100 MG tablet  Recurrent major depressive disorder, in full remission ([place]) -PHQ 9 score 8 -counseling -medication options -f/u in 4-6 wks  GAD -GAD 7 score 7 -counseling -discussed medication options. -will start wellbutrin -f/u in 4-6 wks - Plan: buPROPion (WELLBUTRIN) 100 MG tablet  Irritable bowel syndrome, unspecified type  -Low FODMAP diet -self care, decrease stress - Plan: Ambulatory referral to diabetic education  Adhesive capsulitis of right shoulder -s/p cortisone injection -continue f/u with Ortho  Paresthesia  -h/o low normal vit B12 which may contribute to symptoms -discussed OTC supplement - Plan: Ambulatory referral to Neurology  Vitamin B 12 deficiency  -low normal vit B12 04/15/21 - Plan: Ambulatory referral to Neurology, Ambulatory referral to diabetic education  Gastroesophageal reflux disease, unspecified whether esophagitis present  -avoid foods known to cause symptoms - Plan: omeprazole (PRILOSEC) 20 MG capsule, Ambulatory referral to diabetic education  PTSD -evaluated by [facility] clinic at [facility] -counseling  F/u in 4-6 wks  Grier Mitts, MD

## 2021-07-22 ENCOUNTER — Encounter: Payer: 59 | Attending: Family Medicine | Admitting: Dietician

## 2021-07-22 ENCOUNTER — Encounter: Payer: Self-pay | Admitting: Dietician

## 2021-07-22 ENCOUNTER — Other Ambulatory Visit: Payer: Self-pay

## 2021-07-22 DIAGNOSIS — K581 Irritable bowel syndrome with constipation: Secondary | ICD-10-CM | POA: Insufficient documentation

## 2021-07-22 DIAGNOSIS — E538 Deficiency of other specified B group vitamins: Secondary | ICD-10-CM | POA: Insufficient documentation

## 2021-07-22 DIAGNOSIS — E559 Vitamin D deficiency, unspecified: Secondary | ICD-10-CM

## 2021-07-22 NOTE — Patient Instructions (Addendum)
Recommendations: Listen to your body. Start adding Low FodMap foods back into your diet one by one to determine tolerance if you are not aware of tolerance currently. You have the most concerns with legumes.  These may be something you may be able to build tolerance to.  If you try them, add back slowly with small amounts (such as 1/4 cup).  Consider Beano to see if this helps.  Do you tolerate? Peppermint, Chocolate, Caffeine Coffee Turmeric Ginger Vinegar Black pepper Acid foods (fruits and vegetables) Raw onions Banana Fatty foods Cooked vs raw  Food emulsifiers can also cause gut issues Avoid Carrageenan  Continue to drink adequate water  Continue yoga and staying active such as walking  Continue vitamin B-12 supplementation if your vitamin B-12 has not increased, consider an alternate brand such as Methylation Complete by [employer] Magnesium citrate is best for constipation  Continue your vitamin D supplementation  Consider increasing your fiber slowly as tolerated.  Resouce: Fiber Fueled by Dr. Anabel Bene (or podcasts) Dr. Aletha Halim also has a new cookbook
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
## 2021-07-23 ENCOUNTER — Encounter: Payer: Self-pay | Admitting: Dietician

## 2021-07-28 DIAGNOSIS — M7500 Adhesive capsulitis of unspecified shoulder: Secondary | ICD-10-CM | POA: Insufficient documentation

## 2021-08-05 ENCOUNTER — Other Ambulatory Visit: Payer: Self-pay | Admitting: Family Medicine

## 2021-08-05 DIAGNOSIS — F902 Attention-deficit hyperactivity disorder, combined type: Secondary | ICD-10-CM

## 2021-08-05 DIAGNOSIS — F411 Generalized anxiety disorder: Secondary | ICD-10-CM

## 2021-08-27 ENCOUNTER — Encounter: Payer: Self-pay | Admitting: Gastroenterology

## 2021-08-27 ENCOUNTER — Telehealth: Payer: Self-pay

## 2021-08-27 ENCOUNTER — Other Ambulatory Visit (INDEPENDENT_AMBULATORY_CARE_PROVIDER_SITE_OTHER): Payer: 59

## 2021-08-27 ENCOUNTER — Other Ambulatory Visit: Payer: Self-pay

## 2021-08-27 ENCOUNTER — Ambulatory Visit (INDEPENDENT_AMBULATORY_CARE_PROVIDER_SITE_OTHER): Payer: 59 | Admitting: Gastroenterology

## 2021-08-27 VITALS — BP 114/78 | HR 85 | Ht 62.5 in | Wt 146.0 lb

## 2021-08-27 DIAGNOSIS — R1013 Epigastric pain: Secondary | ICD-10-CM | POA: Diagnosis not present

## 2021-08-27 DIAGNOSIS — R6881 Early satiety: Secondary | ICD-10-CM

## 2021-08-27 DIAGNOSIS — K219 Gastro-esophageal reflux disease without esophagitis: Secondary | ICD-10-CM

## 2021-08-27 DIAGNOSIS — R194 Change in bowel habit: Secondary | ICD-10-CM

## 2021-08-27 DIAGNOSIS — Z791 Long term (current) use of non-steroidal anti-inflammatories (NSAID): Secondary | ICD-10-CM | POA: Diagnosis not present

## 2021-08-27 LAB — PROTIME-INR
INR: 1 ratio (ref 0.8–1.0)
Prothrombin Time: 11.1 s (ref 9.6–13.1)

## 2021-08-27 MED ORDER — OMEPRAZOLE 20 MG PO CPDR: 20.0000 mg | DELAYED_RELEASE_CAPSULE | Freq: Every day | ORAL | 1 refills | Status: DC

## 2021-08-27 MED ORDER — CITRUCEL PO POWD: ORAL | Status: DC

## 2021-08-27 NOTE — Telephone Encounter (Signed)
-----   Message from Yetta Flock, MD sent at 08/27/2021  2:07 PM EDT ----- Regarding: lab Hey Jan. I forgot to add that I wanted this patient to have TIBC / ferritin panel. Can you add this and ask her to go to the lab? Thanks, sorry about that

## 2021-08-27 NOTE — Telephone Encounter (Signed)
Ibc/ferritin order placed.  Called and left message for patient to go to the lab today or one day next week at her convenience.

## 2021-08-27 NOTE — Progress Notes (Signed)
Order placed.  LM for patient to go to the lab

## 2021-08-27 NOTE — Addendum Note (Signed)
Addended by: Yetta Flock on: 08/27/2021 02:08 PM   Modules accepted: Level of Service

## 2021-08-27 NOTE — Progress Notes (Signed)
HPI :  50 year old female with a family history of colon cancer, history of altered bowel habits, GERD, here to reestablish her care for multiple upper tract complaints and bowel changes.  I have not seen her since December 2018.  She has had problems with GERD and belching lately.  She has regurgitation of food back up to her mouth at times which tastes bad, sometimes she needs to spit it out or vomit if severe.  This had been going on perhaps once or twice a week, has been a little bit less so lately.  She states the more she eats the worst she feels.  Certain foods clearly will make her feel worse such as eating potatoes, fried foods, popcorn, and nuts.  She does endorse early satiety and has not been eating as much as she used to.  She has been having some epigastric discomfort and distention with this, especially when she eats if she eats too much, although that has not been as much lately.  She denies any significant dysphagia.  She is prescribed omeprazole 20 mg for her history of GERD and takes it as needed.  She has been taking this only about 2 times per month in general and generally does not take it.  She has shoulder pain and was diagnosed with adhesive capsulitis.  She has been using Aleve and ibuprofen since February for her pain, upwards of 600 mg twice daily at 1 point, currently now taking ibuprofen 400 mg a day.  She previously had uterine polyps with menstrual bleeding and was using NSAIDs to help with that however she is now status post surgery no longer needs it for that indication.  She is also prescribed Mobic but states she does not find much relief with that so does not take it.  She denies any overt blood in her stool.  She states her bowels are built altered, she has some urgency sometimes but without a bowel movement.  She generally has a bowel movement every other day, has been using Dulcolax as needed.  Last colonoscopy was in 2018 without any concerning findings.  She has  never had a prior upper endoscopy.  Her last CBC in her file was from April and it was normal with a normal hemoglobin.  She states she thought she was told she had an iron deficiency but I do not see any iron studies in her chart at all.  She inquired about that.  Prior work-up: CT abdomen / pelvis - 04/25/2017 - normal US abdomen 12/1999 - suboptimally distended gallbladder, otherwise normal     Colonoscopy 11/20/2017 - father dx age 62s, chornic loose stools The examined portion of the ileum was normal. - Diverticulosis in the cecum and in the left colon. - One 3 mm polyp in the cecum, removed with a cold biopsy forceps. Resected and retrieved. - Biopsies were taken with a cold forceps from the right colon, left colon and transverse colon for evaluation of microscopic colitis.  1. Surgical [P], cecum, polyp - BENIGN POLYPOID COLORECTAL-TYPE MUCOSA. - THERE IS NO EVIDENCE OF MALIGNANCY. 2. Surgical [P], random sites - BENIGN COLONIC MUCOSA. - NO SIGNIFICANT INFLAMMATION OR OTHER ABNORMALITIES IDENTIFIED.  Repeat in 10 years  Labs negative for celiac     Past Medical History:  Diagnosis Date   ADHD dx feb 2022   no meds for    Allergy    Anemia    Anxiety    Family history of adverse reaction to anesthesia  daughter gets hiccups after anesthesia   GERD (gastroesophageal reflux disease)    Goiter right neck   dr Dwyane Dee follows lov 01-28-2021   last [place] 04-03-2018   Hemiplegic migraine    pt dx 2004.  Pt had 1 seizure after this dx.  Not sure if related to dx or not. maw   History of asthma    none since age 32   History of depression    Seizures ([place])    2004. only 1 seizure   Stroke ([place]) 2019   mild no residual deficit found on mri   Syncope    occ with migraine fatigue with aura and migraine   Vitamin D deficiency      Past Surgical History:  Procedure Laterality Date   COLONOSCOPY  2018   DILATATION & CURETTAGE/HYSTEROSCOPY WITH MYOSURE N/A 07/11/2019    Procedure: [facility];  Surgeon: Servando Salina, MD;  Location: [place];  Service: Gynecology;  Laterality: N/A;   DILATATION & CURETTAGE/HYSTEROSCOPY WITH MYOSURE N/A 04/09/2021   Procedure: DILATATION & CURETTAGE/HYSTEROSCOPY WITH MYOSURE;  Surgeon: Servando Salina, MD;  Location: [place];  Service: Gynecology;  Laterality: N/A;   LAPAROSCOPIC TUBAL LIGATION Bilateral 11/27/2020   Procedure: LAPAROSCOPIC TUBAL LIGATION with BIPOLAR CAUTERY;  Surgeon: Servando Salina, MD;  Location: [facility];  Service: Gynecology;  Laterality: Bilateral;   WISDOM TOOTH EXTRACTION     2009 x4 removed   Family History  Problem Relation Age of Onset   Uterine cancer Mother    Alcohol abuse Mother    Arthritis Mother    Depression Mother    Drug abuse Mother    Heart disease Mother    Mental illness Mother    Stroke Mother    Kidney failure Father    Alcohol abuse Father    Diabetes Father    Prostate cancer Father    Colon cancer Father 73   Kidney disease Father    Depression Brother    Arthritis Maternal Grandmother    COPD Maternal Grandmother    Heart attack Maternal Grandmother    Hypertension Maternal Grandmother    Hyperlipidemia Maternal Grandmother    Arthritis Maternal Grandfather    Heart attack Maternal Grandfather    Hyperlipidemia Maternal Grandfather    Diabetes Paternal Grandfather    Depression Daughter    Asthma Daughter    Depression Daughter    Autism Daughter    Esophageal cancer Neg Hx    Pancreatic cancer Neg Hx    Rectal cancer Neg Hx    Stomach cancer Neg Hx    Thyroid disease Neg Hx    Social History   Tobacco Use   Smoking status: Never   Smokeless tobacco: Never  Vaping Use   Vaping Use: Never used  Substance Use Topics   Alcohol use: Yes    Comment: ocas   Drug use: No   Current Outpatient Medications  Medication Sig Dispense Refill    acetaminophen (TYLENOL) 500 MG tablet Take 500 mg by mouth every 6 (six) hours as needed.     Ascorbic Acid (VITAMIN C) 100 MG tablet Take 100 mg by mouth daily.     aspirin EC 81 MG tablet Take 81 mg by mouth daily.     bisacodyl (DULCOLAX) 5 MG EC tablet Take 10 mg by mouth daily as needed for moderate constipation.     buPROPion (WELLBUTRIN) 100 MG tablet TAKE 1 TABLET BY MOUTH TWICE A  DAY 180 tablet 1   butalbital-acetaminophen-caffeine (FIORICET, ESGIC) 50-325-40 MG tablet Take 1 tablet by mouth every 6 (six) hours as needed for headache. 20 tablet 3   cetirizine (ZYRTEC) 10 MG tablet Take 10 mg by mouth as needed. OTC     Cholecalciferol (VITAMIN D3) 3000 units TABS Take by mouth daily.     Cobalamin Combinations (B-12) [phone] MCG SUBL B12     ibuprofen (ADVIL) 800 MG tablet Take 1 tablet (800 mg total) by mouth every 8 (eight) hours as needed. 30 tablet 5   Magnesium 100 MG CAPS magnesium     Megestrol Acetate (MEGACE ES PO) Take 80 mg by mouth daily.     meloxicam (MOBIC) 15 MG tablet meloxicam 15 mg tablet  TAKE 1 TABLET BY MOUTH EVERY DAY     omeprazole (PRILOSEC) 20 MG capsule Take 1 capsule (20 mg total) by mouth daily. 90 capsule 1   Pediatric Multivitamins-Iron (FLINTSTONES COMPLETE) 10 MG CHEW Chew by mouth daily.     Turmeric (QC TUMERIC COMPLEX PO) Take by mouth.     UNABLE TO FIND Med Name: vitamin b 12 injection q 30 days     UNABLE TO FIND Magnesium 400 mg prn     UNABLE TO FIND Med Name: super green gummi by Goli     No current facility-administered medications for this visit.   Allergies  Allergen Reactions   Other Itching     Review of Systems: All systems reviewed and negative except where noted in HPI.   Lab Results  Component Value Date   WBC 6.9 04/09/2021   HGB 13.1 04/09/2021   HCT 41.5 04/09/2021   MCV 84.9 04/09/2021   PLT 248 04/09/2021    Lab Results  Component Value Date   CREATININE 0.76 04/15/2021   BUN 11 04/15/2021   NA 138  04/15/2021   K 4.7 04/15/2021   CL 104 04/15/2021   CO2 29 04/15/2021    Lab Results  Component Value Date   ALT 10 04/15/2021   AST 15 04/15/2021   ALKPHOS 55 04/15/2021   BILITOT 0.3 04/15/2021     Physical Exam: BP 114/78   Pulse 85   Ht 5' 2.5" (1.588 m)   Wt 146 lb (66.2 kg)   BMI 26.28 kg/m  Constitutional: Pleasant,well-developed, female in no acute distress. HEENT: Normocephalic and atraumatic. Conjunctivae are normal. No scleral icterus. Neck supple.  Cardiovascular: Normal rate, regular rhythm.  Pulmonary/chest: Effort normal and breath sounds normal.  Abdominal: Soft, nondistended, nontender.  There are no masses palpable.  Extremities: no edema Lymphadenopathy: No cervical adenopathy noted. Neurological: Alert and oriented to person place and time. Skin: Skin is warm and dry. No rashes noted. Psychiatric: Normal mood and affect. Behavior is normal.   ASSESSMENT AND PLAN: 50 year old female here to reestablish care for the following:  GERD Early satiety Dyspepsia NSAID use Altered bowel habits  As above, multiple upper tract symptoms in the setting of significant NSAID use.  I counseled her on long-term risks of NSAIDs and with daily use she is at risk for PUD, gastritis, renal disease, cardiovascular disease.  Recommend she minimize NSAID use and stop if possible in light of the symptoms.  She can use Tylenol as needed for her pains although admittedly that will not help her shoulder nearly as much.  In light of these ongoing symptoms I offered her an upper endoscopy to further evaluate, rule out large hiatal hernia, PUD/gastritis, sure no Barrett's.  We  discussed risk benefits of the procedure and anesthesia and she wants to proceed.  This will help provide long-term recommendations on management.  In interim I recommended she take omeprazole 20 mg every day hopefully that will help her feel better.  She inquired about iron deficiency but I do not see any iron  studies on file.  We will send a TIBC ferritin panel to make sure okay.  In light of her irregular bowel habits I recommended a trial of Citrucel once daily to see if that will provide some regularity.  If she does have an iron deficiency we may do a colonoscopy as well.  Plan: - take omeprazole '20mg'$  / day - refilled - stop / minimize NSAID use - TIBC / ferritin panel  - Citrucel once daily - schedule EGD - may add colonoscopy if iron deficient however suspect upper tract would be the most likely course in the setting of NSAID use.  Jolly Mango, MD [facility]     Billie Ruddy, MD

## 2021-08-27 NOTE — Patient Instructions (Addendum)
If you are age 48 or older, your body mass index should be between 23-30. Your Body mass index is 26.28 kg/m. If this is out of the aforementioned range listed, please consider follow up with your Primary Care Provider.  If you are age 73 or younger, your body mass index should be between 19-25. Your Body mass index is 26.28 kg/m. If this is out of the aformentioned range listed, please consider follow up with your Primary Care Provider.   __________________________________________________________  The [facility] providers would like to encourage you to use [facility] to communicate with providers for non-urgent requests or questions.  Due to long hold times on the telephone, sending your provider a message by [facility] may be a faster and more efficient way to get a response.  Please allow 48 business hours for a response.  Please remember that this is for non-urgent requests.   You have been scheduled for an endoscopy. Please follow written instructions given to you at your visit today. If you use inhalers (even only as needed), please bring them with you on the day of your procedure.  Please go to the lab in the basement of our building to have lab work done as you leave today. Hit "B" for basement when you get on the elevator.  When the doors open the lab is on your left.  We will call you with the results. Thank you.  Take omeprazole 20 mg once daily.  We have sent a refill to your pharmacy for you to pick up at your convenience.  Minimize NSAID use.  Please purchase the following medications over the counter and take as directed: Citrucel: Take as directed, daily  Thank you for entrusting me with your care and for choosing [facility], Dr. Woxall Cellar

## 2021-09-10 ENCOUNTER — Other Ambulatory Visit (INDEPENDENT_AMBULATORY_CARE_PROVIDER_SITE_OTHER): Payer: 59

## 2021-09-10 DIAGNOSIS — K219 Gastro-esophageal reflux disease without esophagitis: Secondary | ICD-10-CM | POA: Diagnosis not present

## 2021-09-10 DIAGNOSIS — R1013 Epigastric pain: Secondary | ICD-10-CM | POA: Diagnosis not present

## 2021-09-10 DIAGNOSIS — R6881 Early satiety: Secondary | ICD-10-CM | POA: Diagnosis not present

## 2021-09-10 DIAGNOSIS — R194 Change in bowel habit: Secondary | ICD-10-CM | POA: Diagnosis not present

## 2021-09-10 LAB — IBC + FERRITIN
Ferritin: 19.6 ng/mL (ref 10.0–291.0)
Iron: 59 ug/dL (ref 42–145)
Saturation Ratios: 14.6 % — ABNORMAL LOW (ref 20.0–50.0)
TIBC: 404.6 ug/dL (ref 250.0–450.0)
Transferrin: 289 mg/dL (ref 212.0–360.0)

## 2021-09-14 ENCOUNTER — Encounter: Payer: Self-pay | Admitting: Gastroenterology

## 2021-09-14 ENCOUNTER — Ambulatory Visit (AMBULATORY_SURGERY_CENTER): Payer: 59 | Admitting: Gastroenterology

## 2021-09-14 ENCOUNTER — Other Ambulatory Visit: Payer: Self-pay

## 2021-09-14 VITALS — BP 112/83 | HR 73 | Temp 97.8°F | Resp 11 | Ht 62.0 in | Wt 146.0 lb

## 2021-09-14 DIAGNOSIS — R1013 Epigastric pain: Secondary | ICD-10-CM | POA: Diagnosis not present

## 2021-09-14 DIAGNOSIS — K319 Disease of stomach and duodenum, unspecified: Secondary | ICD-10-CM | POA: Diagnosis not present

## 2021-09-14 DIAGNOSIS — K219 Gastro-esophageal reflux disease without esophagitis: Secondary | ICD-10-CM | POA: Diagnosis not present

## 2021-09-14 DIAGNOSIS — R6881 Early satiety: Secondary | ICD-10-CM

## 2021-09-14 MED ORDER — OMEPRAZOLE 20 MG PO CPDR: 20.0000 mg | DELAYED_RELEASE_CAPSULE | Freq: Every day | ORAL | 0 refills | Status: DC

## 2021-09-14 MED ORDER — SODIUM CHLORIDE 0.9 % IV SOLN: 500.0000 mL | Freq: Once | INTRAVENOUS | Status: DC

## 2021-09-14 NOTE — Op Note (Signed)
[facility] Patient Name: Whitney Kent Procedure Date: 09/14/2021 1:29 PM MRN: [id number] Endoscopist: Remo Lipps P. Havery Moros , MD Age: 50 Referring MD:  Date of Birth: 1971-12-19 Gender: Female Account #: [id number] Procedure:                Upper GI endoscopy Indications:              Dyspepsia, History of gastro-esophageal reflux                            disease, Early satiety - history of routine NSAID                            use which has since been stopped. Patient has not                            yet tried daily PPI for symptoms, which persist Medicines:                Monitored Anesthesia Care Procedure:                Pre-Anesthesia Assessment:                           - Prior to the procedure, a History and Physical                            was performed, and patient medications and                            allergies were reviewed. The patient's tolerance of                            previous anesthesia was also reviewed. The risks                            and benefits of the procedure and the sedation                            options and risks were discussed with the patient.                            All questions were answered, and informed consent                            was obtained. Prior Anticoagulants: The patient has                            taken no previous anticoagulant or antiplatelet                            agents. ASA Grade Assessment: II - A patient with                            mild systemic disease. After reviewing the risks  and benefits, the patient was deemed in                            satisfactory condition to undergo the procedure.                           After obtaining informed consent, the endoscope was                            passed under direct vision. Throughout the                            procedure, the patient's blood pressure, pulse, and                             oxygen saturations were monitored continuously. The                            Endoscope was introduced through the mouth, and                            advanced to the second part of duodenum. The upper                            GI endoscopy was accomplished without difficulty.                            The patient tolerated the procedure well. Scope In: Scope Out: Findings:                 Esophagogastric landmarks were identified: the                            Z-line was found at 37 cm, the gastroesophageal                            junction was found at 37 cm and the upper extent of                            the gastric folds was found at 38 cm from the                            incisors.                           A 1 cm hiatal hernia was present.                           The exam of the esophagus was otherwise normal.                           The entire examined stomach was normal. Biopsies  were taken with a cold forceps for Helicobacter                            pylori testing.                           The duodenal bulb and second portion of the                            duodenum were normal. Complications:            No immediate complications. Estimated blood loss:                            Minimal. Estimated Blood Loss:     Estimated blood loss was minimal. Impression:               - Esophagogastric landmarks identified.                           - 1 cm hiatal hernia.                           - Normal esophagus otherwise.                           - Normal stomach. Biopsied.                           - Normal duodenal bulb and second portion of the                            duodenum. Recommendation:           - Patient has a contact number available for                            emergencies. The signs and symptoms of potential                            delayed complications were discussed with the                            patient.  Return to normal activities tomorrow.                            Written discharge instructions were provided to the                            patient.                           - Resume previous diet.                           - Continue present medications.                           -  Await pathology results.                           - Recommend trial of omeprazole 20mg  / day for 30                            days to see if this will reduce / prevent symptoms Remo Lipps P. Cameryn Schum, MD 09/14/2021 1:46:07 PM This report has been signed electronically.

## 2021-09-14 NOTE — Progress Notes (Signed)
VS taken by DT 

## 2021-09-14 NOTE — Progress Notes (Signed)
Report to PACU, RN, vss, BBS= Clear.  

## 2021-09-14 NOTE — Progress Notes (Signed)
Called to room to assist during endoscopic procedure.  Patient ID and intended procedure confirmed with present staff. Received instructions for my participation in the procedure from the performing physician.  

## 2021-09-14 NOTE — Progress Notes (Signed)
History and Physical Interval Note: See clinic note from 08/27/21 for full details of her case. No interval changes since I have last seen her. She has stopped using NSAIDs routinely. She has not been using PPI regularly as previously recommended. Her symptoms largely persist. No cardiopulmonary symptoms. She wishes to proceed with EGD after discussion of risks / benefits.  09/14/2021 1:31 PM  Whitney Kent  has presented today for endoscopic procedure(s), with the diagnosis of  Encounter Diagnoses  Name Primary?   Gastroesophageal reflux disease, unspecified whether esophagitis present Yes   Early satiety    Dyspepsia   .  The various methods of evaluation and treatment have been discussed with the patient and/or family. After consideration of risks, benefits and other options for treatment, the patient has consented to  the endoscopic procedure(s).   The patient's history has been reviewed, patient examined, no change in status, stable for surgery.  I have reviewed the patient's chart and labs.  Questions were answered to the patient's satisfaction.    Jolly Mango, MD [facility]

## 2021-09-14 NOTE — Patient Instructions (Addendum)
Resume previous diet Continue current medications Await pathology results Try taking omeprazole 20mg  daily with water only and at least 30 minutes before eating. Try this for one month only to reduce symptoms  YOU HAD AN ENDOSCOPIC PROCEDURE TODAY AT [facility]:   Refer to the procedure report that was given to you for any specific questions about what was found during the examination.  If the procedure report does not answer your questions, please call your gastroenterologist to clarify.  If you requested that your care partner not be given the details of your procedure findings, then the procedure report has been included in a sealed envelope for you to review at your convenience later.  YOU SHOULD EXPECT: Some feelings of bloating in the abdomen. Passage of more gas than usual.  Walking can help get rid of the air that was put into your GI tract during the procedure and reduce the bloating. If you had a lower endoscopy (such as a colonoscopy or flexible sigmoidoscopy) you may notice spotting of blood in your stool or on the toilet paper. If you underwent a bowel prep for your procedure, you may not have a normal bowel movement for a few days.  Please Note:  You might notice some irritation and congestion in your nose or some drainage.  This is from the oxygen used during your procedure.  There is no need for concern and it should clear up in a day or so.  SYMPTOMS TO REPORT IMMEDIATELY: following upper endoscopy (EGD)  Vomiting of blood or coffee ground material  New chest pain or pain under the shoulder blades  Painful or persistently difficult swallowing  New shortness of breath  Fever of 100F or higher  Black, tarry-looking stools  For urgent or emergent issues, a gastroenterologist can be reached at any hour by calling [phone]. Do not use MyChart messaging for urgent concerns.    DIET:  We do recommend a small meal at first, but then you may proceed to your  regular diet.  Drink plenty of fluids but you should avoid alcoholic beverages for 24 hours.  ACTIVITY:  You should plan to take it easy for the rest of today and you should NOT DRIVE or use heavy machinery until tomorrow (because of the sedation medicines used during the test).    FOLLOW UP: Our staff will call the number listed on your records 48-72 hours following your procedure to check on you and address any questions or concerns that you may have regarding the information given to you following your procedure. If we do not reach you, we will leave a message.  We will attempt to reach you two times.  During this call, we will ask if you have developed any symptoms of COVID 19. If you develop any symptoms (ie: fever, flu-like symptoms, shortness of breath, cough etc.) before then, please call [phone].  If you test positive for Covid 19 in the 2 weeks post procedure, please call and report this information to [place].    If any biopsies were taken you will be contacted by phone or by letter within the next 1-3 weeks.  Please call us at [phone] if you have not heard about the biopsies in 3 weeks.    SIGNATURES/CONFIDENTIALITY: You and/or your care partner have signed paperwork which will be entered into your electronic medical record.  These signatures attest to the fact that that the information above on your After Visit Summary has been reviewed and  is understood.  Full responsibility of the confidentiality of this discharge information lies with you and/or your care-partner.

## 2021-09-16 ENCOUNTER — Telehealth: Payer: Self-pay

## 2021-09-16 NOTE — Telephone Encounter (Signed)
No answer, left message to call back later today, B.Evadene Wardrip RN. 

## 2021-09-16 NOTE — Telephone Encounter (Signed)
Called [phone] and heard pt's voice mail message, but mail box was full and could not leave a message.  This call was a follow up call from procedure 09/14/21. maw

## 2021-09-23 ENCOUNTER — Ambulatory Visit: Payer: 59 | Admitting: Dietician

## 2021-10-15 ENCOUNTER — Ambulatory Visit: Payer: 59

## 2021-12-27 ENCOUNTER — Telehealth: Payer: Self-pay

## 2021-12-27 ENCOUNTER — Encounter: Payer: Self-pay | Admitting: Family Medicine

## 2021-12-27 ENCOUNTER — Ambulatory Visit (INDEPENDENT_AMBULATORY_CARE_PROVIDER_SITE_OTHER): Payer: 59 | Admitting: Family Medicine

## 2021-12-27 VITALS — BP 110/78 | HR 82 | Temp 97.3°F | Ht 62.5 in | Wt 146.8 lb

## 2021-12-27 DIAGNOSIS — E538 Deficiency of other specified B group vitamins: Secondary | ICD-10-CM

## 2021-12-27 DIAGNOSIS — K589 Irritable bowel syndrome without diarrhea: Secondary | ICD-10-CM | POA: Diagnosis not present

## 2021-12-27 DIAGNOSIS — Z23 Encounter for immunization: Secondary | ICD-10-CM | POA: Diagnosis not present

## 2021-12-27 DIAGNOSIS — M7501 Adhesive capsulitis of right shoulder: Secondary | ICD-10-CM

## 2021-12-27 DIAGNOSIS — F902 Attention-deficit hyperactivity disorder, combined type: Secondary | ICD-10-CM

## 2021-12-27 MED ORDER — AMPHETAMINE-DEXTROAMPHETAMINE 10 MG PO TABS: 10.0000 mg | ORAL_TABLET | Freq: Two times a day (BID) | ORAL | 0 refills | Status: DC

## 2021-12-27 MED ORDER — CYANOCOBALAMIN 1000 MCG/ML IJ SOLN
1000.0000 ug | Freq: Once | INTRAMUSCULAR | Status: AC
  Administered 2021-12-27: 1000 ug via INTRAMUSCULAR

## 2021-12-27 MED ORDER — CYANOCOBALAMIN 1000 MCG/ML IJ SOLN: 1000.0000 ug | Freq: Once | INTRAMUSCULAR | Status: DC

## 2021-12-27 NOTE — Progress Notes (Signed)
Whitney Kent presents today for injection per MD orders. B12 injection  administered IM in left Upper Arm. Administration without incident. Patient tolerated well. Taquita Demby,cma

## 2021-12-27 NOTE — Progress Notes (Signed)
Subjective:    Patient ID: Whitney Kent, female    DOB: 27-Sep-1971, 51 y.o.   MRN: [id number]  Chief Complaint  Patient presents with   Follow-up    Adhd medication/ B12 injection    HPI Patient was seen today for f/u on ADHD and vit B12.  Pt started on Wellbutrin to help with anxiety and possibly ADHD symptoms.  Pt stopped taking Wellbutrin as it was caused migraines.  Would like to proceed with trial of ADHD medication.  Pt completed formal ADHD testing at [facility] psychology [facility].  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  She is afraid that her ADHD symptoms will affect her ability to properly do her job.  Patient states overall sleep, mood are good.  Still working on anxiety.  Trying meditation.  Pt in PT and followed by Ortho for right shoulder adhesive capsulitis.  Notes some improvement in ROM but still painful.  Hoping to avoid surgery.  Pt inquires about having vitamin B12 injections this visit.  Previously noted to have symptomatic low normal vitamin B12 of 243 on 04/15/2021.  Last vitamin B12 injection was July.  Notes some improvement of GI issues with taking magnesium, Dulcolax, and other OTC supplements.  Past Medical History:  Diagnosis Date   ADHD dx feb 2022   no meds for    Allergy    Anemia    Anxiety    Family history of adverse reaction to anesthesia    daughter gets hiccups after anesthesia   GERD (gastroesophageal reflux disease)    Goiter right neck   dr Dwyane Dee follows lov 01-28-2021   last [place] 04-03-2018   Hemiplegic migraine    pt dx 2004.  Pt had 1 seizure after this dx.  Not sure if related to dx or not. maw   History of asthma    none since age 75   History of depression    Seizures ([place])    2004. only 1 seizure   Stroke ([place]) 2019   mild no residual deficit found on mri   Syncope    occ with migraine fatigue with aura and migraine   Vitamin D deficiency     No Known  Allergies  ROS General: Denies fever, chills, night sweats, changes in weight, changes in appetite HEENT: Denies headaches, ear pain, changes in vision, rhinorrhea, sore throat CV: Denies CP, palpitations, SOB, orthopnea Pulm: Denies SOB, cough, wheezing GI: Denies abdominal pain, nausea, vomiting, diarrhea, constipation GU: Denies dysuria, hematuria, frequency, vaginal discharge Msk: Denies muscle cramps, joint pains  + right shoulder pain Neuro: Denies weakness, numbness, tingling Skin: Denies rashes, bruising Psych: Denies depression, anxiety, hallucinations  + anxiety, decreased concentration    Objective:    Blood pressure 110/78, pulse 82, temperature (!) 97.3 F (36.3 C), temperature source Temporal, height 5' 2.5" (1.588 m), weight 146 lb 12.8 oz (66.6 kg), SpO2 99 %.  Gen. Pleasant, well-nourished, in no distress, normal affect   HEENT: [place]/AT, face symmetric, conjunctiva clear, no scleral icterus, PERRLA, EOMI, nares patent without drainage Lungs: no accessory muscle use, CTAB, no wheezes or rales Cardiovascular: RRR, no m/r/g, no peripheral edema Musculoskeletal: No deformities, no cyanosis or clubbing, normal tone Neuro:  A&Ox3, CN II-XII intact, normal gait Skin:  Warm, no lesions/ rash  Wt Readings from Last 3 Encounters:  12/27/21 146 lb 12.8 oz (66.6 kg)  09/14/21 146 lb (66.2 kg)  08/27/21 146 lb (66.2 kg)  Lab Results  Component Value Date   WBC 6.9 04/09/2021   HGB 13.1 04/09/2021   HCT 41.5 04/09/2021   PLT 248 04/09/2021   GLUCOSE 84 04/15/2021   CHOL 139 04/15/2021   TRIG 112.0 04/15/2021   HDL 58.30 04/15/2021   LDLCALC 58 04/15/2021   ALT 10 04/15/2021   AST 15 04/15/2021   NA 138 04/15/2021   K 4.7 04/15/2021   CL 104 04/15/2021   CREATININE 0.76 04/15/2021   BUN 11 04/15/2021   CO2 29 04/15/2021   TSH 0.61 04/15/2021   INR 1.0 08/27/2021   HGBA1C 5.5 04/15/2021   GAD 7 : Generalized Anxiety Score 12/27/2021 07/05/2021 04/15/2021 09/11/2019   Nervous, Anxious, on Edge 2 0 0 1  Control/stop worrying 1 1 0 2  Worry too much - different things 2 1 1 3   Trouble relaxing 1 1 1 3   Restless 1 1 1 3   Easily annoyed or irritable 1 1 1 2   Afraid - awful might happen 1 2 0 2  Total GAD 7 Score 9 7 4 16   Anxiety Difficulty Somewhat difficult Somewhat difficult Somewhat difficult Somewhat difficult   Depression screen [facility] 2/9 12/27/2021 12/27/2021 07/22/2021  Decreased Interest 0 0 0  Down, Depressed, Hopeless 0 0 1  PHQ - 2 Score 0 0 1  Altered sleeping 1 - -  Tired, decreased energy 1 - -  Change in appetite 0 - -  Feeling bad or failure about yourself  0 - -  Trouble concentrating 2 - -  Moving slowly or fidgety/restless 1 - -  Suicidal thoughts 0 - -  PHQ-9 Score 5 - -  Difficult doing work/chores Somewhat difficult - -      Assessment/Plan:  Attention deficit hyperactivity disorder (ADHD), combined type  -formal adult ADHD testing completed at [facility] -PHQ-9 score 5.  GAD-7 score 9 -discussed various medication options -PDMP reviewed -will start Adderall 10 mg BID.  Pt advised not to take 2nd dose later than 1 pm. -wellbutrin d/c'd as caused migraines - Plan: amphetamine-dextroamphetamine (ADDERALL) 10 MG tablet -Follow-up in 1 month sooner if needed  Vitamin B 12 deficiency -Vitamin B12 243 on 03/30/2021 -recheck vit b12 - Plan: Vitamin B12, cyanocobalamin ((VITAMIN B-12)) injection 1,000 mcg  Adhesive capsulitis of right shoulder -mild improvement in ROM -continue PT -continue f/u with Ortho  Irritable bowel syndrome, unspecified type -Currently controlled on OTC supplements -Continue follow-up with GI as needed  Need for immunization against influenza - Plan: Flu Vaccine QUAD 68mo+IM (Fluarix, Fluzone & Alfiuria Quad PF)  F/u in 1 month for ADHD  Grier Mitts, MD

## 2021-12-28 LAB — VITAMIN B12: Vitamin B-12: >1550 pg/mL — ABNORMAL HIGH (ref 211–911)

## 2021-12-30 NOTE — Telephone Encounter (Signed)
error 

## 2022-01-27 ENCOUNTER — Ambulatory Visit: Payer: 59 | Admitting: Family Medicine

## 2022-01-31 ENCOUNTER — Encounter: Payer: Self-pay | Admitting: Family Medicine

## 2022-01-31 ENCOUNTER — Ambulatory Visit (INDEPENDENT_AMBULATORY_CARE_PROVIDER_SITE_OTHER): Payer: 59 | Admitting: Family Medicine

## 2022-01-31 VITALS — BP 100/78 | HR 91 | Temp 98.0°F | Ht 62.5 in | Wt 143.0 lb

## 2022-01-31 DIAGNOSIS — F902 Attention-deficit hyperactivity disorder, combined type: Secondary | ICD-10-CM

## 2022-01-31 MED ORDER — AMPHETAMINE-DEXTROAMPHETAMINE 15 MG PO TABS
15.0000 mg | ORAL_TABLET | Freq: Every day | ORAL | 0 refills | Status: DC
Start: 2022-01-31 — End: 2022-04-18

## 2022-01-31 MED ORDER — AMPHETAMINE-DEXTROAMPHETAMINE 15 MG PO TABS: 15.0000 mg | ORAL_TABLET | Freq: Every day | ORAL | 0 refills | Status: DC

## 2022-01-31 NOTE — Progress Notes (Signed)
Subjective:    Patient ID: Whitney Kent, female    DOB: 02-Sep-1971, 51 y.o.   MRN: [id number]  Chief Complaint  Patient presents with   Medication Management    HPI Patient was seen today for follow-up on ADHD.  Patient states she tried taking Adderall 10 mg twice daily but developed a headache later that evening.  Patient was unsure if the second dose of Adderall caused a headache so she has only been taking the medication once daily.  Patient states she is able to address task with less procrastination since starting medication.  Patient thinks she has noticed a difference in symptoms but is unsure.  Working on [employer], site.  Denies insomnia.  Notes decreased appetite prior to starting medication but eats regular meals daily despite not feeling hungry.  Drinking nearly a gallon of water most days.  Past Medical History:  Diagnosis Date   ADHD dx feb 2022   no meds for    Allergy    Anemia    Anxiety    Family history of adverse reaction to anesthesia    daughter gets hiccups after anesthesia   GERD (gastroesophageal reflux disease)    Goiter right neck   dr Dwyane Dee follows lov 01-28-2021   last [place] 04-03-2018   Hemiplegic migraine    pt dx 2004.  Pt had 1 seizure after this dx.  Not sure if related to dx or not. maw   History of asthma    none since age 19   History of depression    Seizures ([place])    2004. only 1 seizure   Stroke ([place]) 2019   mild no residual deficit found on mri   Syncope    occ with migraine fatigue with aura and migraine   Vitamin D deficiency     No Known Allergies  ROS General: Denies fever, chills, night sweats, changes in weight, changes in appetite HEENT: Denies headaches, ear pain, changes in vision, rhinorrhea, sore throat CV: Denies CP, palpitations, SOB, orthopnea Pulm: Denies SOB, cough, wheezing GI: Denies abdominal pain, nausea, vomiting, diarrhea, constipation GU: Denies dysuria, hematuria, frequency, vaginal discharge Msk:  Denies muscle cramps, joint pains Neuro: Denies weakness, numbness, tingling Skin: Denies rashes, bruising Psych: Denies depression, anxiety, hallucinations  +inattention     Objective:    Blood pressure 100/78, pulse 91, temperature 98 F (36.7 C), temperature source Oral, height 5' 2.5" (1.588 m), weight 143 lb (64.9 kg), SpO2 100 %.   Gen. Pleasant, well-nourished, in no distress, normal affect   HEENT: [place]/AT, face symmetric, conjunctiva clear, no scleral icterus, PERRLA, EOMI, nares patent without drainage Lungs: no accessory muscle use, CTAB, no wheezes or rales Cardiovascular: RRR, no m/r/g, no peripheral edema Musculoskeletal: No deformities, no cyanosis or clubbing, normal tone Neuro:  A&Ox3, CN II-XII intact, normal gait Skin:  Warm, no lesions/ rash   Wt Readings from Last 3 Encounters:  01/31/22 143 lb (64.9 kg)  12/27/21 146 lb 12.8 oz (66.6 kg)  09/14/21 146 lb (66.2 kg)    Lab Results  Component Value Date   WBC 6.9 04/09/2021   HGB 13.1 04/09/2021   HCT 41.5 04/09/2021   PLT 248 04/09/2021   GLUCOSE 84 04/15/2021   CHOL 139 04/15/2021   TRIG 112.0 04/15/2021   HDL 58.30 04/15/2021   LDLCALC 58 04/15/2021   ALT 10 04/15/2021   AST 15 04/15/2021   NA 138 04/15/2021   K 4.7 04/15/2021   CL 104 04/15/2021  CREATININE 0.76 04/15/2021   BUN 11 04/15/2021   CO2 29 04/15/2021   TSH 0.61 04/15/2021   INR 1.0 08/27/2021   HGBA1C 5.5 04/15/2021    Assessment/Plan:  Attention deficit hyperactivity disorder (ADHD), combined type -Some improvement noted on Adderall 10 mg daily. -We will increase Adderall from 10 mg daily to 15 mg daily. -Pt will ask family and coworkers for input regarding a difference in symptoms. -Patient will take 1-1/2 tabs of Adderall 10 mg daily, then pick up new Rx. -33-month supply sent to pharmacy.  Patient will notify clinic of any side effects. -PDMP reviewed  - Plan: amphetamine-dextroamphetamine (ADDERALL) 15 MG tablet,  amphetamine-dextroamphetamine (ADDERALL) 15 MG tablet, amphetamine-dextroamphetamine (ADDERALL) 15 MG tablet  F/u as needed in the next 3 months, sooner if needed  Grier Mitts, MD

## 2022-03-17 ENCOUNTER — Other Ambulatory Visit: Payer: Self-pay | Admitting: Family Medicine

## 2022-03-17 ENCOUNTER — Encounter: Payer: Self-pay | Admitting: Family Medicine

## 2022-03-17 DIAGNOSIS — R202 Paresthesia of skin: Secondary | ICD-10-CM

## 2022-03-17 DIAGNOSIS — G43409 Hemiplegic migraine, not intractable, without status migrainosus: Secondary | ICD-10-CM

## 2022-03-17 DIAGNOSIS — E538 Deficiency of other specified B group vitamins: Secondary | ICD-10-CM

## 2022-03-24 ENCOUNTER — Encounter: Payer: Self-pay | Admitting: Family Medicine

## 2022-03-24 ENCOUNTER — Ambulatory Visit (INDEPENDENT_AMBULATORY_CARE_PROVIDER_SITE_OTHER): Payer: 59 | Admitting: Family Medicine

## 2022-03-24 VITALS — BP 118/80 | HR 89 | Temp 98.5°F | Wt 141.2 lb

## 2022-03-24 DIAGNOSIS — R058 Other specified cough: Secondary | ICD-10-CM

## 2022-03-24 DIAGNOSIS — U099 Post covid-19 condition, unspecified: Secondary | ICD-10-CM | POA: Diagnosis not present

## 2022-03-24 DIAGNOSIS — R0982 Postnasal drip: Secondary | ICD-10-CM

## 2022-03-24 DIAGNOSIS — J0100 Acute maxillary sinusitis, unspecified: Secondary | ICD-10-CM

## 2022-03-24 MED ORDER — ALBUTEROL SULFATE HFA 108 (90 BASE) MCG/ACT IN AERS: 2.0000 | INHALATION_SPRAY | Freq: Four times a day (QID) | RESPIRATORY_TRACT | 0 refills | Status: DC | PRN

## 2022-03-24 MED ORDER — AMOXICILLIN 500 MG PO TABS: 500.0000 mg | ORAL_TABLET | Freq: Two times a day (BID) | ORAL | 0 refills | Status: AC

## 2022-03-24 NOTE — Progress Notes (Signed)
Subjective:  ? ? Patient ID: Whitney Kent, female    DOB: [birth year], 51 y.o.   MRN: [id number] ? ?Chief Complaint  ?Patient presents with  ? Fatigue  ?  Started feeling bad 2/28, heart rate has been low, can hear heart in ears. Has been having headaches/ migraines. Breathing is not the same, feels winded more quickly, telemed gave inhaler.also gave prednisone. Drainage has taken allergy med for, for the itchy eyes.   ? ? ?HPI ?Patient is a 51 yo female with pmh sig for allergies, anemia, anxiety, Goiter, h/o asthma, GERD, ADHD, h/o sz, h/o CVA, migraines, h/o depression who was seen today for ongoing concern(s).  Pt with migraines different from her normal HAs, nasal drainage fatigue, changes in breathing/labored breathing s/p COVID-19 infection 3/1-3/16/23.  Prescribed prednisone 3/20 via E-visit.  Also notes itchy eyes.  Tried OTC allergy meds, cough drops, nasal spray, albuterol inhaler without relief.  Denies fever, chills, nausea, vomiting, sore throat, ear pain/pressure. ?Past Medical History:  ?Diagnosis Date  ? ADHD dx feb 2022  ? no meds for   ? Allergy   ? Anemia   ? Anxiety   ? Family history of adverse reaction to anesthesia   ? daughter gets hiccups after anesthesia  ? GERD (gastroesophageal reflux disease)   ? Goiter right neck  ? dr Dwyane Dee follows lov 01-28-2021   last [place] 04-03-2018  ? Hemiplegic migraine   ? pt dx 2004.  Pt had 1 seizure after this dx.  Not sure if related to dx or not. maw  ? History of asthma   ? none since age 25  ? History of depression   ? Seizures ([place])   ? 2004. only 1 seizure  ? Stroke [facility]) 2019  ? mild no residual deficit found on mri  ? Syncope   ? occ with migraine fatigue with aura and migraine  ? Vitamin D deficiency   ? ? ?No Known Allergies ? ?ROS ?General: Denies fever, chills, night sweats, changes in weight, changes in appetite +fatigue ?HEENT: Denies headaches, ear pain, changes in vision, rhinorrhea, sore throat  +nasal congestion, HAs, facial pressure,  itchy eyes ?CV: Denies CP, palpitations, SOB, orthopnea  +SOB ?Pulm: Denies SOB, cough, wheezing  +SOB, cough ?GI: Denies abdominal pain, nausea, vomiting, diarrhea, constipation ?GU: Denies dysuria, hematuria, frequency, vaginal discharge ?Msk: Denies muscle cramps, joint pains ?Neuro: Denies weakness, numbness, tingling ?Skin: Denies rashes, bruising ?Psych: Denies depression, anxiety, hallucinations ? ?   ?Objective:  ?  ?Blood pressure 118/80, pulse 89, temperature 98.5 ?F (36.9 ?C), temperature source Oral, weight 141 lb 3.2 oz (64 kg), SpO2 99 %. ? ?Gen. Pleasant, well-nourished, in no distress, normal affect   ?HEENT: [place]/AT, face symmetric, conjunctiva clear, no scleral icterus, PERRLA, EOMI, nares patent with clear drainage, TTP of maxillary sinuses pharynx with clear postnasal drainage, no erythema or exudate.  TMs full b/l. ?Lungs: Intermittent dry cough, no accessory muscle use, CTAB, no wheezes or rales ?Cardiovascular: RRR, no m/r/g, no peripheral edema ?Musculoskeletal: No deformities, no cyanosis or clubbing, normal tone ?Neuro:  A&Ox3, CN II-XII intact, normal gait ?Skin:  Warm, no lesions/ rash ? ? ?Wt Readings from Last 3 Encounters:  ?03/24/22 141 lb 3.2 oz (64 kg)  ?01/31/22 143 lb (64.9 kg)  ?12/27/21 146 lb 12.8 oz (66.6 kg)  ? ? ?Lab Results  ?Component Value Date  ? WBC 6.9 04/09/2021  ? HGB 13.1 04/09/2021  ? HCT 41.5 04/09/2021  ? PLT 248 04/09/2021  ?  GLUCOSE 84 04/15/2021  ? CHOL 139 04/15/2021  ? TRIG 112.0 04/15/2021  ? HDL 58.30 04/15/2021  ? [place] 58 04/15/2021  ? ALT 10 04/15/2021  ? AST 15 04/15/2021  ? NA 138 04/15/2021  ? K 4.7 04/15/2021  ? CL 104 04/15/2021  ? CREATININE 0.76 04/15/2021  ? BUN 11 04/15/2021  ? CO2 29 04/15/2021  ? TSH 0.61 04/15/2021  ? INR 1.0 08/27/2021  ? HGBA1C 5.5 04/15/2021  ? ? ?Assessment/Plan: ? ?Acute maxillary sinusitis, recurrence not specified  ?-Start ABX ?-Saline nasal rinse, Flonase, Tylenol or NSAIDs as needed ?-Given precautions ?- Plan:  amoxicillin (AMOXIL) 500 MG tablet ? ?Post-nasal drainage ?-OTC antihistamine ?-Continue saline nasal rinse or Flonase ? 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 ?Post-viral cough syndrome ?-s/p prednisone course 03/07/22 ?-Reassuring as lungs clear on exam.  Nasal drainage likely contributing to cough ?-OTC antihistamine, Flonase nasal spray, saline nasal rinse ?-Continue to monitor.  Given strict precautions ?- Plan: albuterol (VENTOLIN HFA) 108 (90 Base) MCG/ACT inhaler ? ?F/u as needed ? ?Grier Mitts, MD ?

## 2022-03-28 ENCOUNTER — Telehealth: Payer: Self-pay

## 2022-03-28 ENCOUNTER — Other Ambulatory Visit: Payer: Self-pay | Admitting: Endocrinology

## 2022-03-28 DIAGNOSIS — E049 Nontoxic goiter, unspecified: Secondary | ICD-10-CM

## 2022-03-28 NOTE — Telephone Encounter (Signed)
Patient called requesting orders for thyroid labwork and imaging.   She is concerned with a cough and enlarged neck and would like test done prior to being seen if possible.  Please advise. ?

## 2022-03-29 NOTE — Telephone Encounter (Signed)
She will need to be scheduled for thyroid labs, ordered.  We do not do imaging studies before the office visit.  Let her know that the cough is not related to thyroid and she will need to talk to her PCP about this ? ?Left message for patient to call back to discuss. ?

## 2022-04-08 ENCOUNTER — Ambulatory Visit (INDEPENDENT_AMBULATORY_CARE_PROVIDER_SITE_OTHER): Payer: 59 | Admitting: Endocrinology

## 2022-04-08 ENCOUNTER — Encounter: Payer: Self-pay | Admitting: Endocrinology

## 2022-04-08 VITALS — BP 140/90 | HR 78 | Ht 67.5 in | Wt 141.8 lb

## 2022-04-08 DIAGNOSIS — E049 Nontoxic goiter, unspecified: Secondary | ICD-10-CM

## 2022-04-08 NOTE — Progress Notes (Signed)
Patient ID: Whitney Kent, female   DOB: 07/01/1971, 51 y.o.   MRN: [id number] ?       ? ? ?Reason for Appointment: Goiter, follow-up visit ? ? ? History of Present Illness:  ? ?The patient's thyroid enlargement was first discovered in the year 2002 reportedly ?At that time she was told by she had a goiter ?She thinks she had small nodules on initial evaluation but no intervention was needed ?She has never been on thyroid supplementation ? ?She was also periodically followed by another endocrinologist previously ? ?She was last seen in 2022 ? ?She does feel some swelling on the right side of the neck along with some tightness ?Also has had some discomfort on the left neck and shoulder area ?No difficulty swallowing and no choking symptoms ? ?She was told by her PCP to follow-up ?Labs have not been done yet ? ?TPO antibody is within the normal range ? ?Lab results as follows: ? ?Lab Results  ?Component Value Date  ? FREET4 0.86 04/15/2021  ? FREET4 0.94 01/27/2021  ? FREET4 1.2 07/24/2020  ? TSH 0.61 04/15/2021  ? TSH 0.70 01/27/2021  ? TSH 0.69 07/24/2020  ? ? ?She has had an ultrasound exam in 2019 which showed mostly a right-sided thyroid enlargement and no significant nodules ? ? ? ?Allergies as of 04/08/2022   ?No Known Allergies ?  ? ?  ?Medication List  ?  ? ?  ? Accurate as of April 08, 2022 11:15 AM. If you have any questions, ask your nurse or doctor.  ?  ?  ? ?  ? ?acetaminophen 500 MG tablet ?Commonly known as: TYLENOL ?Take 500 mg by mouth every 6 (six) hours as needed. ?  ?albuterol 108 (90 Base) MCG/ACT inhaler ?Commonly known as: VENTOLIN HFA ?Inhale 2 puffs into the lungs every 6 (six) hours as needed for wheezing or shortness of breath. ?  ?amphetamine-dextroamphetamine 15 MG tablet ?Commonly known as: Adderall ?Take 1 tablet by mouth daily. ?  ?amphetamine-dextroamphetamine 15 MG tablet ?Commonly known as: Adderall ?Take 1 tablet by mouth daily. ?  ?amphetamine-dextroamphetamine 15 MG  tablet ?Commonly known as: Adderall ?Take 1 tablet by mouth daily. ?  ?aspirin EC 81 MG tablet ?Take 81 mg by mouth daily. ?  ?B-12 [phone] MCG Subl ?B12 ?  ?bisacodyl 5 MG EC tablet ?Commonly known as: DULCOLAX ?Take 10 mg by mouth daily as needed for moderate constipation. ?  ?butalbital-acetaminophen-caffeine 50-325-40 MG tablet ?Commonly known as: FIORICET ?Take 1 tablet by mouth every 6 (six) hours as needed for headache. ?  ?cetirizine 10 MG tablet ?Commonly known as: ZYRTEC ?Take 10 mg by mouth as needed. OTC ?  ?Citrucel oral powder ?Generic drug: methylcellulose ?Take as directed daily ?  ?Flintstones Complete 10 MG Chew ?Chew by mouth daily. ?  ?ibuprofen 800 MG tablet ?Commonly known as: ADVIL ?Take 1 tablet (800 mg total) by mouth every 8 (eight) hours as needed. ?  ?Magnesium 100 MG Caps ?magnesium ?  ?MEGACE ES PO ?Take 80 mg by mouth daily. ?  ?meloxicam 15 MG tablet ?Commonly known as: MOBIC ?  ?omeprazole 20 MG capsule ?Commonly known as: PRILOSEC ?Take 1 capsule (20 mg total) by mouth daily. ?  ?omeprazole 20 MG capsule ?Commonly known as: PRILOSEC ?Take 1 capsule (20 mg total) by mouth daily. ?  ?QC TUMERIC COMPLEX PO ?Take by mouth. ?  ?UNABLE TO FIND ?Med Name: vitamin b 12 injection q 30 days ?  ?UNABLE TO FIND ?  Magnesium 400 mg prn ?  ?UNABLE TO FIND ?Med Name: super green gummi by [place]oli ?  ?vitamin C 100 MG tablet ?Take 100 mg by mouth daily. ?  ?Vitamin D3 75 MCG (3000 UT) Tabs ?Take by mouth daily. ?  ? ?  ? ? ?Allergies: No Known Allergies ? ?Past Medical History:  ?Diagnosis Date  ? ADHD dx feb 2022  ? no meds for   ? Allergy   ? Anemia   ? Anxiety   ? Family history of adverse reaction to anesthesia   ? daughter gets hiccups after anesthesia  ? GERD (gastroesophageal reflux disease)   ? Goiter right neck  ? dr Dwyane Dee follows lov 01-28-2021   last [place] 04-03-2018  ? Hemiplegic migraine   ? pt dx 2004.  Pt had 1 seizure after this dx.  Not sure if related to dx or not. maw  ? History of asthma    ? none since age 33  ? History of depression   ? Seizures ([place])   ? 2004. only 1 seizure  ? Stroke [facility]) 2019  ? mild no residual deficit found on mri  ? Syncope   ? occ with migraine fatigue with aura and migraine  ? Vitamin D deficiency   ? ? ?Past Surgical History:  ?Procedure Laterality Date  ? COLONOSCOPY  2018  ? DILATATION & CURETTAGE/HYSTEROSCOPY WITH MYOSURE N/A 07/11/2019  ? Procedure: [place];  Surgeon: Servando Salina, MD;  Location: [facility];  Service: Gynecology;  Laterality: N/A;  ? DILATATION & CURETTAGE/HYSTEROSCOPY WITH MYOSURE N/A 04/09/2021  ? Procedure: [place];  Surgeon: Servando Salina, MD;  Location: [facility];  Service: Gynecology;  Laterality: N/A;  ? LAPAROSCOPIC TUBAL LIGATION Bilateral 11/27/2020  ? Procedure: LAPAROSCOPIC TUBAL LIGATION with BIPOLAR CAUTERY;  Surgeon: Servando Salina, MD;  Location: [facility];  Service: Gynecology;  Laterality: Bilateral;  ? WISDOM TOOTH EXTRACTION    ? 2009 x4 removed  ? ? ?Family History  ?Problem Relation Age of Onset  ? Uterine cancer Mother   ? Alcohol abuse Mother   ? Arthritis Mother   ? Depression Mother   ? Drug abuse Mother   ? Heart disease Mother   ? Mental illness Mother   ? Stroke Mother   ? Kidney failure Father   ? Alcohol abuse Father   ? Diabetes Father   ? Prostate cancer Father   ? Colon cancer Father 46  ? Kidney disease Father   ? Depression Brother   ? Colon cancer Maternal Aunt   ? Arthritis Maternal Grandmother   ? COPD Maternal Grandmother   ? Heart attack Maternal Grandmother   ? Hypertension Maternal Grandmother   ? Hyperlipidemia Maternal Grandmother   ? Arthritis Maternal Grandfather   ? Heart attack Maternal Grandfather   ? Hyperlipidemia Maternal Grandfather   ? Diabetes Paternal Grandfather   ? Depression Daughter   ? Asthma Daughter   ? Depression Daughter   ? Autism Daughter    ? Esophageal cancer Neg Hx   ? Pancreatic cancer Neg Hx   ? Rectal cancer Neg Hx   ? Stomach cancer Neg Hx   ? Thyroid disease Neg Hx   ? ? ?Social History:  reports that she has never smoked. She has never used smokeless tobacco. She reports current alcohol use. She reports that she does not use drugs. ? ? ? ? ?Review of Systems ? ? ?  Examination: ?  ?BP 140/90   Pulse 78   Ht 5' 7.5" (1.715 m)   Wt 141 lb 12.8 oz (64.3 kg)   SpO2 99%   BMI 21.88 kg/m?  ? ? ?RIGHT thyroid lobe is enlarged about twice normal and diffuse, somewhat extending toward the isthmus ?This is relatively soft and fleshy ?No nodules felt  ?Left lobe is not palpable  ? ?Neck circumference is 33 cm over the mid thyroid ?There is no deviation of the trachea ? ?There is no lymphadenopathy in the neck ? ? ?Assessment/Plan: ? ?GOITER:  ?Long-standing asymptomatic goiter with usually normal thyroid functions ?Her thyroid enlargement is primarily on the right side as before ?On her exam her thyroid enlargement is about the same or smaller and relatively fleshy nodule non nodular texture compared to last visit ? ?Although she is having some sensation of fullness on the right side and slight pressure discussed that this is not unexpected ?However since she always has had a simple goiter without significant nodules and her exam is unchanged she does not need any further ultrasound exams ? ?She will have her PCP draw her thyroid labs at the upcoming visit ? ? ?Elayne Snare ?04/08/2022 ? ? ? ? ?

## 2022-04-18 ENCOUNTER — Ambulatory Visit (INDEPENDENT_AMBULATORY_CARE_PROVIDER_SITE_OTHER): Payer: 59 | Admitting: Family Medicine

## 2022-04-18 ENCOUNTER — Encounter: Payer: Self-pay | Admitting: Family Medicine

## 2022-04-18 VITALS — BP 114/80 | HR 85 | Temp 98.1°F | Ht 62.5 in | Wt 140.4 lb

## 2022-04-18 DIAGNOSIS — G43109 Migraine with aura, not intractable, without status migrainosus: Secondary | ICD-10-CM

## 2022-04-18 DIAGNOSIS — E049 Nontoxic goiter, unspecified: Secondary | ICD-10-CM

## 2022-04-18 DIAGNOSIS — Z0000 Encounter for general adult medical examination without abnormal findings: Secondary | ICD-10-CM | POA: Diagnosis not present

## 2022-04-18 DIAGNOSIS — Z13220 Encounter for screening for lipoid disorders: Secondary | ICD-10-CM

## 2022-04-18 DIAGNOSIS — F902 Attention-deficit hyperactivity disorder, combined type: Secondary | ICD-10-CM | POA: Diagnosis not present

## 2022-04-18 DIAGNOSIS — Z1159 Encounter for screening for other viral diseases: Secondary | ICD-10-CM

## 2022-04-18 LAB — CBC WITH DIFFERENTIAL/PLATELET
Basophils Absolute: 0 10*3/uL (ref 0.0–0.1)
Basophils Relative: 0.1 % (ref 0.0–3.0)
Eosinophils Absolute: 0.5 10*3/uL (ref 0.0–0.7)
Eosinophils Relative: 8.7 % — ABNORMAL HIGH (ref 0.0–5.0)
HCT: 41.6 % (ref 36.0–46.0)
Hemoglobin: 13.7 g/dL (ref 12.0–15.0)
Lymphocytes Relative: 31.2 % (ref 12.0–46.0)
Lymphs Abs: 1.6 10*3/uL (ref 0.7–4.0)
MCHC: 33 g/dL (ref 30.0–36.0)
MCV: 81 fl (ref 78.0–100.0)
Monocytes Absolute: 0.5 10*3/uL (ref 0.1–1.0)
Monocytes Relative: 9.9 % (ref 3.0–12.0)
Neutro Abs: 2.7 10*3/uL (ref 1.4–7.7)
Neutrophils Relative %: 50.1 % (ref 43.0–77.0)
Platelets: 265 10*3/uL (ref 150.0–400.0)
RBC: 5.14 Mil/uL — ABNORMAL HIGH (ref 3.87–5.11)
RDW: 13.7 % (ref 11.5–15.5)
WBC: 5.3 10*3/uL (ref 4.0–10.5)

## 2022-04-18 LAB — LIPID PANEL
Cholesterol: 188 mg/dL (ref 0–200)
HDL: 86.5 mg/dL (ref >39.00)
LDL Cholesterol: 86 mg/dL (ref 0–99)
NonHDL: 101.79
Total CHOL/HDL Ratio: 2
Triglycerides: 77 mg/dL (ref 0.0–149.0)
VLDL: 15.4 mg/dL (ref 0.0–40.0)

## 2022-04-18 LAB — COMPREHENSIVE METABOLIC PANEL
ALT: 10 U/L (ref 0–35)
AST: 16 U/L (ref 0–37)
Albumin: 4.3 g/dL (ref 3.5–5.2)
Alkaline Phosphatase: 58 U/L (ref 39–117)
BUN: 14 mg/dL (ref 6–23)
CO2: 29 mEq/L (ref 19–32)
Calcium: 9.8 mg/dL (ref 8.4–10.5)
Chloride: 106 mEq/L (ref 96–112)
Creatinine, Ser: 0.85 mg/dL (ref 0.40–1.20)
GFR: 80.62 mL/min (ref >60.00)
Glucose, Bld: 98 mg/dL (ref 70–99)
Potassium: 4.6 mEq/L (ref 3.5–5.1)
Sodium: 142 mEq/L (ref 135–145)
Total Bilirubin: 0.5 mg/dL (ref 0.2–1.2)
Total Protein: 7.2 g/dL (ref 6.0–8.3)

## 2022-04-18 LAB — HEMOGLOBIN A1C: Hgb A1c MFr Bld: 5.7 % (ref 4.6–6.5)

## 2022-04-18 LAB — TSH: TSH: 0.76 u[IU]/mL (ref 0.35–5.50)

## 2022-04-18 LAB — T4, FREE: Free T4: 0.88 ng/dL (ref 0.60–1.60)

## 2022-04-18 MED ORDER — AMPHETAMINE-DEXTROAMPHETAMINE 15 MG PO TABS: 15.0000 mg | ORAL_TABLET | Freq: Every day | ORAL | 0 refills | Status: DC

## 2022-04-18 MED ORDER — KETOROLAC TROMETHAMINE 60 MG/2ML IM SOLN
30.0000 mg | Freq: Once | INTRAMUSCULAR | Status: AC
  Administered 2022-04-18: 30 mg via INTRAMUSCULAR

## 2022-04-18 MED ORDER — ALBUTEROL SULFATE HFA 108 (90 BASE) MCG/ACT IN AERS: 2.0000 | INHALATION_SPRAY | Freq: Four times a day (QID) | RESPIRATORY_TRACT | 0 refills | Status: DC | PRN

## 2022-04-18 NOTE — Progress Notes (Signed)
Subjective:  ?  ? Whitney Kent is a 51 y.o. female and is here for a comprehensive physical exam. The patient reports increased migraines and HAs.  Has not had a hemiplegic migraine, but notes aura and pain in L temple today.  Has been taking ibuprofen daily for the last 4 days.  Has app with Neurology tomorrow.  Seen by Endo for goiter.  Requesting TSH and free T4 be checked with labs this visit.  Pt notes a tight/tethered feeling in R lateral neck and occasional dysphagia.  States has to swallow several times to get pills down.  No difficulty swallowing liquids or most foods.  Patient concerned as no imaging of the thyroid was obtained.  Last thyroid ultrasound stable asymmetric thyromegaly without nodules on 05/02/2018.  Patient notes some improvement since starting Adderall 15 mg daily.  Takes medication no later than 10 AM.  Denies difficulty sleeping, changes in appetite, or weight.  Patient seen by OB/GYN last Thursday for Pap.  Mammogram done 07/06/2021. ? ?Patient inquires about an alternative for an albuterol inhaler as her insurance would not cover the cost. ? ?Social History  ? ?Socioeconomic History  ? Marital status: Divorced  ?  Spouse name: Not on file  ? Number of children: 2  ? Years of education: 79  ? Highest education level: Some college, no degree  ?Occupational History  ? Not on file  ?Tobacco Use  ? Smoking status: Never  ? Smokeless tobacco: Never  ?Vaping Use  ? Vaping Use: Never used  ?Substance and Sexual Activity  ? Alcohol use: Yes  ?  Comment: ocas  ? Drug use: No  ? Sexual activity: Yes  ?  Partners: Male  ?  Birth control/protection: Condom  ?Other Topics Concern  ? Not on file  ?Social History Narrative  ? Lives at home with her daughter  ? Right handed  ? Drinks occasional caffeine  ? ?Social Determinants of Health  ? ?Financial Resource Strain: Not on file  ?Food Insecurity: Not on file  ?Transportation Needs: Not on file  ?Physical Activity: Not on file  ?Stress: Not on  file  ?Social Connections: Not on file  ?Intimate Partner Violence: Not on file  ? ?Health Maintenance  ?Topic Date Due  ? Hepatitis C Screening  Never done  ? TETANUS/TDAP  Never done  ? PAP SMEAR-Modifier  09/26/2020  ? COVID-19 Vaccine (5 - Booster for Pfizer series) 12/26/2020  ? INFLUENZA VACCINE  07/19/2022  ? COLONOSCOPY (Pts 45-56yr Insurance coverage will need to be confirmed)  11/21/2027  ? HIV Screening  Completed  ? HPV VACCINES  Aged Out  ? ? ?The following portions of the patient's history were reviewed and updated as appropriate: allergies, current medications, past family history, past medical history, past social history, past surgical history, and problem list. ? ?Review of Systems ?Pertinent items noted in HPI and remainder of comprehensive ROS otherwise negative.  ? ?Objective:  ? ? BP 114/80 (BP Location: Right Arm, Patient Position: Sitting, Cuff Size: Normal)   Pulse 85   Temp 98.1 ?F (36.7 ?C) (Oral)   Ht 5' 2.5" (1.588 m)   Wt 140 lb 6.4 oz (63.7 kg)   LMP 04/20/2021   SpO2 97%   BMI 25.27 kg/m?  ?General appearance: alert, cooperative, and no distress ?Head: Normocephalic, without obvious abnormality, atraumatic ?Eyes: conjunctivae/corneas clear. PERRL, EOM's intact. Fundi benign. ?Ears: normal TM's and external ear canals both ears ?Nose: Nares normal. Septum midline. Mucosa normal. No  drainage or sinus tenderness. ?Throat: lips, mucosa, and tongue normal; teeth and gums normal ?Neck: no adenopathy, no carotid bruit, no JVD, supple, symmetrical, trachea midline, and Thyromegal, R lobe>L lobe, smooth, symmetric, no nodules appreciated. ?Lungs: clear to auscultation bilaterally ?Heart: regular rate and rhythm, S1, S2 normal, no murmur, click, rub or gallop ?Abdomen: soft, non-tender; bowel sounds normal; no masses,  no organomegaly ?Extremities: extremities normal, atraumatic, no cyanosis or edema ?Pulses: 2+ and symmetric ?Skin: Skin color, texture, turgor normal. No rashes or  lesions ?Lymph nodes: Cervical, supraclavicular, and axillary nodes normal. ?Neurologic: Alert and oriented X 3, normal strength and tone. Normal symmetric reflexes. Normal coordination and gait  ?  ?Assessment:  ? ? Healthy female exam with acute headache, thyromegaly,  ?  ?Plan:  ? ? Anticipatory guidance given including wearing seatbelts, smoke detectors in the home, increasing physical activity, increasing p.o. intake of water and vegetables. ?-Obtain labs ?-Pap done 03/2022 with OB/GYN ?-Mammogram up-to-date done 07/06/2021 ?-Colonoscopy done 11/20/2017 ?-Immunizations reviewed.  Consider Tdap as last done 04/13/2012.  Influenza done 12/27/2021. ?-Given handout ?-Next CPE in 1 year ?See After Visit Summary for Counseling Recommendations  ? ?Goiter ?-Stable though patient notes dysphagia and a pulling sensation in right lateral neck. ?-Thyroid ultrasound 05/02/2018 with stable asymmetric thyromegaly.  No imaging indication for biopsy or follow-up. ?-TSH 04/15/2021 0.610 ?-Continue follow-up with Endo. ?-Patient interested in second opinion. ? - Plan: TSH, T4, Free, referral to Endo ? ?Attention deficit hyperactivity disorder (ADHD), combined type  ?-Stable ?-Continue Adderall 15 mg daily ?-PMDP reviewed ?- Plan: amphetamine-dextroamphetamine (ADDERALL) 15 MG tablet, amphetamine-dextroamphetamine (ADDERALL) 15 MG tablet, amphetamine-dextroamphetamine (ADDERALL) 15 MG tablet ? ?Encounter for hepatitis C screening test for low risk patient  ?- Plan: Hep C Antibody ? ?Migraine with aura and without status migrainosus, not intractable ?-Increased frequency over the last few weeks ?-Toradol 30 mg IM given in clinic ?-Patient advised to limit NSAID use is likely causing rebound headaches. ?-Discussed other headache prevention including rest, hydration, limiting caffeine intake ?-Continue magnesium supplement ?-Encouraged to keep neurology appointment this week ?- Plan: CBC with Differential/Platelet, ketorolac (TORADOL)  injection 30 mg ? ?Screening for cholesterol level ?-Last lipid panel 04/15/2021 normal with total cholesterol 139, HDL 58, LDL 58, triglycerides 112. ?-Continue lifestyle modifications ?- Plan: Lipid panel ? ?F/u in 3 months, sooner if needed. ? ?Grier Mitts, MD ? ?

## 2022-04-19 ENCOUNTER — Encounter: Payer: Self-pay | Admitting: Neurology

## 2022-04-19 ENCOUNTER — Ambulatory Visit: Payer: 59 | Admitting: Neurology

## 2022-04-19 VITALS — BP 112/79 | HR 73 | Ht 62.0 in | Wt 144.6 lb

## 2022-04-19 DIAGNOSIS — G8190 Hemiplegia, unspecified affecting unspecified side: Secondary | ICD-10-CM

## 2022-04-19 DIAGNOSIS — H93A9 Pulsatile tinnitus, unspecified ear: Secondary | ICD-10-CM

## 2022-04-19 DIAGNOSIS — H539 Unspecified visual disturbance: Secondary | ICD-10-CM

## 2022-04-19 DIAGNOSIS — R200 Anesthesia of skin: Secondary | ICD-10-CM

## 2022-04-19 DIAGNOSIS — R519 Headache, unspecified: Secondary | ICD-10-CM

## 2022-04-19 DIAGNOSIS — G43711 Chronic migraine without aura, intractable, with status migrainosus: Secondary | ICD-10-CM

## 2022-04-19 DIAGNOSIS — R51 Headache with orthostatic component, not elsewhere classified: Secondary | ICD-10-CM

## 2022-04-19 DIAGNOSIS — R479 Unspecified speech disturbances: Secondary | ICD-10-CM

## 2022-04-19 LAB — HEPATITIS C ANTIBODY
Hepatitis C Ab: NONREACTIVE
SIGNAL TO CUT-OFF: 0.15 (ref <1.00)

## 2022-04-19 MED ORDER — NURTEC 75 MG PO TBDP: 75.0000 mg | ORAL_TABLET | Freq: Every day | ORAL | 0 refills | Status: DC | PRN

## 2022-04-19 MED ORDER — ONDANSETRON 4 MG PO TBDP: 4.0000 mg | ORAL_TABLET | Freq: Three times a day (TID) | ORAL | 3 refills | Status: DC | PRN

## 2022-04-19 MED ORDER — AJOVY 225 MG/1.5ML ~~LOC~~ SOAJ: 225.0000 mg | SUBCUTANEOUS | 11 refills | Status: DC

## 2022-04-19 NOTE — Patient Instructions (Addendum)
MRI of the brain w/wo contrast and MRA of the head ?Preventative: Ajovy once mnthly (If [employer] we can switch they are comparable) ?Take nurtec daily for the next 8 days to bridge and we can order if it works for emergent/acute  ? ?There is increased risk for stroke in women with migraine with aura and a contraindication for the combined contraceptive pill for use by women who have migraine with aura. The risk for women with migraine without aura is lower. However other risk factors like smoking are far more likely to increase stroke risk than migraine. There is a recommendation for no smoking and for the use of OCPs without estrogen such as progestogen only pills particularly for women with migraine with aura.Marland Kitchen People who have migraine headaches with auras may be 3 times more likely to have a stroke caused by a blood clot, compared to migraine patients who don't see auras. Women who take hormone-replacement therapy may be 30 percent more likely to suffer a clot-based stroke than women not taking medication containing estrogen. Other risk factors like smoking and high blood pressure may be  much more important. ? ? ?Fremanezumab Injection ?What is this medication? ?FREMANEZUMAB (fre ma NEZ ue mab) prevents migraines. It works by blocking a substance in the body that causes migraines. It is a monoclonal antibody. ?This medicine may be used for other purposes; ask your health care provider or pharmacist if you have questions. ?COMMON BRAND NAME(S): AJOVY ?What should I tell my care team before I take this medication? ?They need to know if you have any of these conditions: ?An unusual or allergic reaction to fremanezumab, other medications, foods, dyes, or preservatives ?Pregnant or trying to get pregnant ?Breast-feeding ?How should I use this medication? ?This medication is injected under the skin. You will be taught how to prepare and give it. Take it as directed on the prescription label. Keep  taking it unless your care team tells you to stop. ?It is important that you put your used needles and syringes in a special sharps container. Do not put them in a trash can. If you do not have a sharps container, call your pharmacist or care team to get one. ?Talk to your care team about the use of this medication in children. Special care may be needed. ?Overdosage: If you think you have taken too much of this medicine contact a poison control center or emergency room at once. ?NOTE: This medicine is only for you. Do not share this medicine with others. ?What if I miss a dose? ?If you miss a dose, take it as soon as you can. If it is almost time for your next dose, take only that dose. Do not take double or extra doses. ?What may interact with this medication? ?Interactions are not expected. ?This list may not describe all possible interactions. Give your health care provider a list of all the medicines, herbs, non-prescription drugs, or dietary supplements you use. Also tell them if you smoke, drink alcohol, or use illegal drugs. Some items may interact with your medicine. ?What should I watch for while using this medication? ?Tell your care team if your symptoms do not start to get better or if they get worse. ?What side effects may I notice from receiving this medication? ?Side effects that you should report to your care team as soon as possible: ?Allergic reactions or angioedema--skin rash, itching or hives, swelling of the face, eyes, lips, tongue, arms, or legs, trouble swallowing or breathing ?  Side effects that usually do not require medical attention (report to your care team if they continue or are bothersome): ?Pain, redness, or irritation at injection site ?This list may not describe all possible side effects. Call your doctor for medical advice about side effects. You may report side effects to FDA at 1-800-FDA-1088. ?Where should I keep my medication? ?Keep out of the reach of children and  pets. ?Store in a refrigerator or at room temperature between 20 and 25 degrees C (68 and 77 degrees F). ?Refrigeration (preferred): Store in the refrigerator. Do not freeze. Keep in the original container until you are ready to take it. Remove the dose from the carton about 30 minutes before it is time for you to use it. If the dose is not used, it may be stored in the original container at room temperature for 7 days. Get rid of any unused medication after the expiration date. ?Room Temperature: This medication may be stored at room temperature for up to 7 days. Keep it in the original container. Protect from light until time of use. If it is stored at room temperature, get rid of any unused medication after 7 days or after it expires, whichever is first. ?To get rid of medications that are no longer needed or have expired: ?Take the medication to a medication take-back program. Check with your pharmacy or law enforcement to find a location. ?If you cannot return the medication, ask your pharmacist or care team how to get rid of this medication safely. ?NOTE: This sheet is a summary. It may not cover all possible information. If you have questions about this medicine, talk to your doctor, pharmacist, or health care provider. ?? 2023 Elsevier/Gold Standard (2022-01-28 00:00:00) ? ?Rimegepant Disintegrating Tablets ?What is this medication? ?RIMEGEPANT (ri ME je pant) prevents and treats migraines. It works by blocking a substance in the body that causes migraines. ?This medicine may be used for other purposes; ask your health care provider or pharmacist if you have questions. ?COMMON BRAND NAME(S): NURTEC ODT ?What should I tell my care team before I take this medication? ?They need to know if you have any of these conditions: ?Kidney disease ?Liver disease ?An unusual or allergic reaction to rimegepant, other medications, foods, dyes, or preservatives ?Pregnant or trying to get pregnant ?Breast-feeding ?How should  I use this medication? ?Take this medication by mouth. Take it as directed on the prescription label. Leave the tablet in the sealed pack until you are ready to take it. With dry hands, open the pack and gently remove the tablet. If the tablet breaks or crumbles, throw it away. Use a new tablet. Place the tablet in the mouth and allow it to dissolve. Then, swallow it. Do not cut, crush, or chew this medication. You do not need water to take this medication. ?Talk to your care team about the use of this medication in children. Special care may be needed. ?Overdosage: If you think you have taken too much of this medicine contact a poison control center or emergency room at once. ?NOTE: This medicine is only for you. Do not share this medicine with others. ?What if I miss a dose? ?This does not apply. This medication is not for regular use. ?What may interact with this medication? ?Certain medications for fungal infections, such as fluconazole, itraconazole ?Rifampin ?This list may not describe all possible interactions. Give your health care provider a list of all the medicines, herbs, non-prescription drugs, or dietary supplements you use. Also  tell them if you smoke, drink alcohol, or use illegal drugs. Some items may interact with your medicine. ?What should I watch for while using this medication? ?Visit your care team for regular checks on your progress. Tell your care team if your symptoms do not start to get better or if they get worse. ?What side effects may I notice from receiving this medication? ?Side effects that you should report to your care team as soon as possible: ?Allergic reactions--skin rash, itching, hives, swelling of the face, lips, tongue, or throat ?Side effects that usually do not require medical attention (report to your care team if they continue or are bothersome): ?Nausea ?Stomach pain ?This list may not describe all possible side effects. Call your doctor for medical advice about side  effects. You may report side effects to FDA at 1-800-FDA-1088. ?Where should I keep my medication? ?Keep out of the reach of children and pets. ?Store at room temperature between 20 and 25 degrees C (68 and 77 degrees

## 2022-04-19 NOTE — Progress Notes (Signed)
?[facility] ? ? ? ?Provider:  Dr Jaynee Eagles ?Referring Provider: Billie Ruddy, MD ?Primary Care Physician:  Billie Ruddy, MD ? ?CC:  Hemiplegic migraine ? ?04/19/2022: She mostly has been getting the aura over hte last several years but more recently followed by head pain. Can get with and without aura. Recently she has had migraine for last 3 weeks. On average 3 months at least8 migraine days a month and > 15 headache days. She is having vision problems, sensory changes in the face and head, pulsating more she can hear her pulse, worsening in frequency and severity, left-side hemiplegia recurring and worsening, thought she was having a stroke, she lost vision, couldn;t move because headaches worse positionally if she bent over, speech impacted. She is menopausal. Has to keep low light, screens set to low light, light sensitivity, nausea, movement makes it worse, unilateral spread tot he head, pulsating/pounding,throbbing, moderate to severe. Hearing problems as well. No other focal neurologic deficits, associated symptoms, inciting events or modifiable factors. ? ?Patient complains of symptoms per HPI as well as the following symptoms: headache . Pertinent negatives and positives per HPI. All others negative ? ? ? ?Tried: verapamil, topamax, nortriptyline/amitriptyline, propranolol (contraindicated due to hypotension) ? ?Interval history 03/27/2018: Patient is here today to follow-up on MRI of the brain and hemiplegic migraines.  MRI of the brain was possibly slightly abnormal, reviewed images with patient and answered all questions.  There was a small area of encephalomalacia which is of unknown etiology, could be remote trauma, could be from congenital infection, injury but could also be from an infarct which I believe is less likely given her age and insignificant vascular risk factors however migraines with aura do have an increased risk of stroke. ? ? ?MRI brain reviewed images with patient  and agree:   ?  ?IMPRESSION:  Slightly abnormal MRI scan of the brain showing tiny area of cystic encephalomalacia and gliosis in the left parietal periventricular region which may represent remote trauma ,injury or infarct. No enhancing lesions are noted.  ? ?HPI:  Whitney Kent is a 51 y.o. 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 She has had symptoms since 1998. She has a cousin with similar migraines. Her daughters has migraines but not hemiplegic. The migraines start with aura, zig zag lines in a half circle, significant osmophobia, also photophobia, sleep helps, tightness in the head all over like a helmet, aura starts 30-60 minutes before the head pain. She can have vision loss central scotoma. She feels generally weak, but she has had weakness on one side of the body that was reversible, difficulty talking with aphasia. No nausea or vomiting. The pain can last for days and it is positional worse with bending over. Severity lessened. Sleep helps. In the last month she had 7 migraines, average 3 days in length, moderately severe. Stress triggers. No other focal neurologic deficits, associated symptoms, inciting events or modifiable factors. ? ?Reviewed notes, labs and imaging from outside physicians, which showed:  ? ?Reviewed referring physician notes.  Patient has hemiplegic migraines happening 1-2 times a week.  May be associated with screen time at the computer at work and increased stress.  Patient's brother is trying to avoid foreclosure.  She also has increased tension in the neck. ? ?CT BRAIN SCAN WITHOUT CONTRAST  (personally reviewed images) ?5 mm images through the brain without contrast enhancement demonstrate no intracerebral mass effect or hemorrhage. There is no  fracture of the calvarium. There is a small fluid level at in the right maxillary sinus with bilateral ethmoid sinus mucosal thickening.   ?   ?IMPRESSION  ?1. No acute intracranial  abnormality.  ?2. Small fluid level in the right maxillary sinus with bilateral ethmoid sinus mucosal thickening. ? ? ?tsh normal ? ?Review of Systems: ?Patient complains of symptoms per HPI as well as the following symptoms: Blurred vision, double vision, ringing in ears, spinning sensation, diarrhea, constipation, headache, insomnia, dizziness, depression, anxiety. Pertinent negatives and positives per HPI. All others negative. ? ? ?Social History  ? ?Socioeconomic History  ? Marital status: Divorced  ?  Spouse name: Not on file  ? Number of children: 2  ? Years of education: 38  ? Highest education level: Some college, no degree  ?Occupational History  ? Not on file  ?Tobacco Use  ? Smoking status: Never  ? Smokeless tobacco: Never  ?Vaping Use  ? Vaping Use: Never used  ?Substance and Sexual Activity  ? Alcohol use: Yes  ?  Comment: ocas  ? Drug use: No  ? Sexual activity: Yes  ?  Partners: Male  ?  Birth control/protection: Condom  ?Other Topics Concern  ? Not on file  ?Social History Narrative  ? Lives at home with her daughter  ? Right handed  ? Drinks occasional caffeine  ? ?Social Determinants of Health  ? ?Financial Resource Strain: Not on file  ?Food Insecurity: Not on file  ?Transportation Needs: Not on file  ?Physical Activity: Not on file  ?Stress: Not on file  ?Social Connections: Not on file  ?Intimate Partner Violence: Not on file  ? ? ?Family History  ?Problem Relation Age of Onset  ? Uterine cancer Mother   ? Alcohol abuse Mother   ? Arthritis Mother   ? Depression Mother   ? Drug abuse Mother   ? Heart disease Mother   ? Mental illness Mother   ? Stroke Mother   ? Kidney failure Father   ? Alcohol abuse Father   ? Diabetes Father   ? Prostate cancer Father   ? Colon cancer Father 70  ? Kidney disease Father   ? Migraines Father   ? Depression Brother   ? Migraines Brother   ? Colon cancer Maternal Aunt   ? Arthritis Maternal Grandmother   ? COPD Maternal Grandmother   ? Heart attack Maternal  Grandmother   ? Hypertension Maternal Grandmother   ? Hyperlipidemia Maternal Grandmother   ? Arthritis Maternal Grandfather   ? Heart attack Maternal Grandfather   ? Hyperlipidemia Maternal Grandfather   ? Diabetes Paternal Grandfather   ? Depression Daughter   ? Asthma Daughter   ? Depression Daughter   ? Autism Daughter   ? Esophageal cancer Neg Hx   ? Pancreatic cancer Neg Hx   ? Rectal cancer Neg Hx   ? Stomach cancer Neg Hx   ? Thyroid disease Neg Hx   ? ? ?Past Medical History:  ?Diagnosis Date  ? ADHD dx feb 2022  ? no meds for   ? Allergy   ? Anemia   ? Anxiety   ? Family history of adverse reaction to anesthesia   ? daughter gets hiccups after anesthesia  ? GERD (gastroesophageal reflux disease)   ? Goiter right neck  ? dr Dwyane Dee follows lov 01-28-2021   last [place] 04-03-2018  ? Hemiplegic migraine   ? pt dx 2004.  Pt had 1 seizure after this  dx.  Not sure if related to dx or not. maw  ? History of asthma   ? none since age 62  ? History of depression   ? Seizures ([place])   ? 2004. only 1 seizure  ? Stroke [facility]) 2019  ? mild no residual deficit found on mri  ? Syncope   ? occ with migraine fatigue with aura and migraine  ? Vitamin D deficiency   ? ? ?Past Surgical History:  ?Procedure Laterality Date  ? COLONOSCOPY  2018  ? DILATATION & CURETTAGE/HYSTEROSCOPY WITH MYOSURE N/A 07/11/2019  ? Procedure: [place];  Surgeon: Servando Salina, MD;  Location: [facility];  Service: Gynecology;  Laterality: N/A;  ? DILATATION & CURETTAGE/HYSTEROSCOPY WITH MYOSURE N/A 04/09/2021  ? Procedure: [place];  Surgeon: Servando Salina, MD;  Location[facility];  Service: Gynecology;  Laterality: N/A;  ? LAPAROSCOPIC TUBAL LIGATION Bilateral 11/27/2020  ? Procedure: LAPAROSCOPIC TUBAL LIGATION with BIPOLAR CAUTERY;  Surgeon: Servando Salina, MD;  Location: [facility];  Service: Gynecology;   Laterality: Bilateral;  ? WISDOM TOOTH EXTRACTION    ? 2009 x4 removed  ? ? ?Current Outpatient Medications  ?Medication Sig Dispense Refill  ? acetaminophen (TYLENOL) 500 MG tablet Take 500 mg by mouth every

## 2022-04-20 ENCOUNTER — Telehealth: Payer: Self-pay | Admitting: Neurology

## 2022-04-20 NOTE — Telephone Encounter (Signed)
[employer] both sent to GI they obtain auth and call patient to schedule. ?

## 2022-05-10 ENCOUNTER — Ambulatory Visit
Admission: RE | Admit: 2022-05-10 | Discharge: 2022-05-10 | Disposition: A | Discharge status: Home / Self Care | Payer: 59 | Source: Ambulatory Visit | Attending: Neurology | Admitting: Neurology

## 2022-05-10 DIAGNOSIS — G8190 Hemiplegia, unspecified affecting unspecified side: Secondary | ICD-10-CM

## 2022-05-10 DIAGNOSIS — R200 Anesthesia of skin: Secondary | ICD-10-CM

## 2022-05-10 DIAGNOSIS — R51 Headache with orthostatic component, not elsewhere classified: Secondary | ICD-10-CM

## 2022-05-10 DIAGNOSIS — H539 Unspecified visual disturbance: Secondary | ICD-10-CM

## 2022-05-10 DIAGNOSIS — H93A9 Pulsatile tinnitus, unspecified ear: Secondary | ICD-10-CM

## 2022-05-10 DIAGNOSIS — R479 Unspecified speech disturbances: Secondary | ICD-10-CM

## 2022-05-10 DIAGNOSIS — R519 Headache, unspecified: Secondary | ICD-10-CM

## 2022-05-10 MED ORDER — GADOBENATE DIMEGLUMINE 529 MG/ML IV SOLN
13.0000 mL | Freq: Once | INTRAVENOUS | Status: AC | PRN
  Administered 2022-05-10: 13 mL via INTRAVENOUS

## 2022-07-15 ENCOUNTER — Ambulatory Visit: Payer: 59 | Admitting: Family Medicine

## 2022-07-20 ENCOUNTER — Ambulatory Visit (INDEPENDENT_AMBULATORY_CARE_PROVIDER_SITE_OTHER): Payer: 59 | Admitting: Family Medicine

## 2022-07-20 VITALS — BP 100/66 | HR 88 | Temp 97.6°F | Wt 140.4 lb

## 2022-07-20 DIAGNOSIS — R5381 Other malaise: Secondary | ICD-10-CM

## 2022-07-20 DIAGNOSIS — L659 Nonscarring hair loss, unspecified: Secondary | ICD-10-CM

## 2022-07-20 DIAGNOSIS — F902 Attention-deficit hyperactivity disorder, combined type: Secondary | ICD-10-CM

## 2022-07-20 DIAGNOSIS — N951 Menopausal and female climacteric states: Secondary | ICD-10-CM | POA: Diagnosis not present

## 2022-07-20 DIAGNOSIS — K5909 Other constipation: Secondary | ICD-10-CM | POA: Diagnosis not present

## 2022-07-20 DIAGNOSIS — H43811 Vitreous degeneration, right eye: Secondary | ICD-10-CM

## 2022-07-20 MED ORDER — ATOMOXETINE HCL 18 MG PO CAPS: 18.0000 mg | ORAL_CAPSULE | Freq: Every day | ORAL | 0 refills | Status: DC

## 2022-07-20 NOTE — Progress Notes (Signed)
Subjective:    Patient ID: Whitney Kent, female    DOB: August 09, 1971, 51 y.o.   MRN: [id number]  Chief Complaint  Patient presents with   Follow-up   Chest Pain    When laying down or reaching. Haven't seen orthopedic due to balance that is owe   Medication Reaction    HPI Patient was seen today for f/u on various issues.  Adderall 15 mg daily causes HAs.  HA is a dull achy pain in frontal area of head, different from pt's migraines.  Tried Adderall 10 mg BID.  Seeing Ophthalmology for R vitreous detachment.  Noticed a flash of light in periphery, having occasional floaters.  Pt having increased hot flashes and hair thinning.  Pt seen by Dr. Dwyane Dee, TSH levels were normal.  Taking OTC menopatonic x 2 mo, has black coash and ginseng in it. Not sure if it is helping.  Concerned about menopause.  LMP >1 yr ago.  Followed by OB/Gyn, last visit May.  Pt with constipation x 3-4 months.  Tried various herbal products, enemas, etc.  Pt with L foot/ankle pain and edema.  Endorses increased walking as went to [employer].  Past Medical History:  Diagnosis Date   ADHD dx feb 2022   no meds for    Allergy    Anemia    Anxiety    Family history of adverse reaction to anesthesia    daughter gets hiccups after anesthesia   GERD (gastroesophageal reflux disease)    Goiter right neck   dr Dwyane Dee follows lov 01-28-2021   last [place] 04-03-2018   Hemiplegic migraine    pt dx 2004.  Pt had 1 seizure after this dx.  Not sure if related to dx or not. maw   History of asthma    none since age 83   History of depression    Seizures ([place])    2004. only 1 seizure   Stroke ([place]) 2019   mild no residual deficit found on mri   Syncope    occ with migraine fatigue with aura and migraine   Vitamin D deficiency     No Known Allergies  ROS General: Denies fever, chills, night sweats, changes in weight, changes in appetite  +hot flashes, hair thinning HEENT: Denies ear pain, rhinorrhea, sore throat   +changes in vision, HA CV: Denies CP, palpitations, SOB, orthopnea Pulm: Denies SOB, cough, wheezing GI: Denies abdominal pain, nausea, vomiting, diarrhea +constipation GU: Denies dysuria, hematuria, frequency, vaginal discharge Msk: Denies muscle cramps, joint pains  +L ankle edema and pain Neuro: Denies weakness, numbness, tingling Skin: Denies rashes, bruising Psych: Denies depression, anxiety, hallucinations  +inattention     Objective:    Blood pressure 100/66, pulse 88, temperature 97.6 F (36.4 C), temperature source Oral, weight 140 lb 6.4 oz (63.7 kg), last menstrual period 04/20/2021, SpO2 97 %.   Gen. Pleasant, well-nourished, in no distress, normal affect   HEENT: [place]/AT, face symmetric, conjunctiva clear, no scleral icterus, PERRLA, EOMI, nares patent without drainage Lungs: no accessory muscle use, CTAB, no wheezes or rales Cardiovascular: RRR, no m/r/g, no peripheral edema Abdomen: BS present, soft, NT/ND, no hepatosplenomegaly. Musculoskeletal: No deformities, no cyanosis or clubbing, normal tone Neuro:  A&Ox3, CN II-XII intact, normal gait Skin:  Warm, no lesions/ rash   Wt Readings from Last 3 Encounters:  04/19/22 144 lb 9.6 oz (65.6 kg)  04/18/22 140 lb 6.4 oz (63.7 kg)  04/08/22 141 lb 12.8 oz (64.3 kg)  Lab Results  Component Value Date   WBC 5.3 04/18/2022   HGB 13.7 04/18/2022   HCT 41.6 04/18/2022   PLT 265.0 04/18/2022   GLUCOSE 98 04/18/2022   CHOL 188 04/18/2022   TRIG 77.0 04/18/2022   HDL 86.50 04/18/2022   LDLCALC 86 04/18/2022   ALT 10 04/18/2022   AST 16 04/18/2022   NA 142 04/18/2022   K 4.6 04/18/2022   CL 106 04/18/2022   CREATININE 0.85 04/18/2022   BUN 14 04/18/2022   CO2 29 04/18/2022   TSH 0.76 04/18/2022   INR 1.0 08/27/2021   HGBA1C 5.7 04/18/2022    Assessment/Plan:  Attention deficit hyperactivity disorder (ADHD), combined type  -d/c adderall 15 md daily 2/2 causing HAs.   -will start strattera  -given  precuations -PDMP reviewed - Plan: atomoxetine (STRATTERA) 18 MG capsule  Other constipation -consider low FODMAP diet -miralax daily prn -f/u with GI for continued symptoms  Hot flashes due to menopause -discussed various treatment options -encouraged to f/u with OB/Gyn   Hair thinning -TSH normal 04/18/22 -consider f/u with Alvan Dame  Vitreous detachment of right eye -given precautions. -continue f/u with Ophthalmology  F/u prn  Grier Mitts, MD

## 2022-08-03 ENCOUNTER — Encounter: Payer: Self-pay | Admitting: Family Medicine

## 2022-08-03 ENCOUNTER — Other Ambulatory Visit: Payer: Self-pay | Admitting: Family Medicine

## 2022-08-03 DIAGNOSIS — F902 Attention-deficit hyperactivity disorder, combined type: Secondary | ICD-10-CM

## 2022-09-05 NOTE — Progress Notes (Deleted)
   PATIENT: Whitney Kent DOB: 1971-06-01  REASON FOR VISIT: follow up HISTORY FROM: patient  Virtual Visit via Telephone Note  I connected with [name] on 09/05/22 at  9:00 AM EDT by telephone and verified that I am speaking with the correct person using two identifiers.   I discussed the limitations, risks, security and privacy concerns of performing an evaluation and management service by telephone and the availability of in person appointments. I also discussed with the patient that there may be a patient responsible charge related to this service. The patient expressed understanding and agreed to proceed.   History of Present Illness:  09/05/22 ALL: Whitney Kent is a 51 y.o. female here today for follow up for chronic/hemiplegic migraines. She was last seen by Dr Jaynee Eagles 04/2022 and started on Ajovy and Nurtec.   Tried: Ajovy, verapamil, topamax, nortriptyline/amitriptyline, propranolol (contraindicated due to hypotension), Nurtec, Triptans contraindicated due to questionable infarct on MRI.    History (copied from Dr Cathren Laine previous note)  04/19/2022: She mostly has been getting the aura over hte last several years but more recently followed by head pain. Can get with and without aura. Recently she has had migraine for last 3 weeks. On average 3 months at least8 migraine days a month and > 15 headache days. She is having vision problems, sensory changes in the face and head, pulsating more she can hear her pulse, worsening in frequency and severity, left-side hemiplegia recurring and worsening, thought she was having a stroke, she lost vision, couldn;t move because headaches worse positionally if she bent over, speech impacted. She is menopausal. Has to keep low light, screens set to low light, light sensitivity, nausea, movement makes it worse, unilateral spread tot he head, pulsating/pounding,throbbing, moderate to severe. Hearing problems as well. No  other focal neurologic deficits, associated symptoms, inciting events or modifiable factors.   Patient complains of symptoms per HPI as well as the following symptoms: headache . Pertinent negatives and positives per HPI. All others negative   Observations/Objective:  Generalized: Well developed, in no acute distress  Mentation: Alert oriented to time, place, history taking. Follows all commands speech and language fluent   Assessment and Plan:  51 y.o. year old female  has a past medical history of ADHD (dx feb 2022), Allergy, Anemia, Anxiety, Family history of adverse reaction to anesthesia, GERD (gastroesophageal reflux disease), Goiter (right neck), Hemiplegic migraine, History of asthma, History of depression, Seizures ([place]), Stroke ([place]) (2019), Syncope, and Vitamin D deficiency. here with  No diagnosis found.  No orders of the defined types were placed in this encounter.   No orders of the defined types were placed in this encounter.    Follow Up Instructions:  I discussed the assessment and treatment plan with the patient. The patient was provided an opportunity to ask questions and all were answered. The patient agreed with the plan and demonstrated an understanding of the instructions.   The patient was advised to call back or seek an in-person evaluation if the symptoms worsen or if the condition fails to improve as anticipated.  I provided *** minutes of non-face-to-face time during this encounter. Patient located at their place of residence during [place] visit. Provider is in the office.    Debbora Presto, NP

## 2022-09-06 ENCOUNTER — Telehealth: Payer: 59 | Admitting: Family Medicine

## 2022-09-06 DIAGNOSIS — G43711 Chronic migraine without aura, intractable, with status migrainosus: Secondary | ICD-10-CM

## 2023-01-09 ENCOUNTER — Telehealth (INDEPENDENT_AMBULATORY_CARE_PROVIDER_SITE_OTHER): Payer: 59 | Admitting: Family Medicine

## 2023-01-09 ENCOUNTER — Encounter: Payer: Self-pay | Admitting: Family Medicine

## 2023-01-09 DIAGNOSIS — L509 Urticaria, unspecified: Secondary | ICD-10-CM

## 2023-01-09 NOTE — Progress Notes (Signed)
Virtual Visit via Video Note  I connected with Whitney Kent on 01/09/23 at 10:00 AM EST by a video enabled telemedicine application 2/2 [id number] pandemic however patient's audio was not working and patient called on phone to complete visit.  Verified that I am speaking with the correct person using two identifiers.  Location patient: home Location provider:work or home office Persons participating in the virtual visit: patient, provider  I discussed the limitations of evaluation and management by telemedicine and the availability of in person appointments. The patient expressed understanding and agreed to proceed.  Chief Complaint  Patient presents with   pruritis     Pt reports sx of itching, hives on face, neck, behind ears and scalp. Unsure of what causes it. She noticed it in Christmas week. Taking benadryl and use cold cloth. She states she has no flares up at the moment.     HPI:  Pt with hives on face since late Dec.  Intermittent, may resolve after a brief time or last all day.  Taking benadryl.  Has Zyrtec but typically takes during allergy season.  May have red welps on face, behind ears, neck, and scalp/around temples that itch.  No changes in soaps, lotions, detergents, foods, or make up.  Pt with high stress level at baseline.  Not washing hair as often.  Has not noticed any dandruff.  Scalp pruritus waking pt up at night.  Pt had flu and COVID vaccines a few days prior to hives starting.    Saw dentist for f/u on a dental pain in a tooth that they were watching.   Started Augmentin for tooth/sinus infection a few days ago.  Pt denies sinus symptoms.    ROS: See pertinent positives and negatives per HPI.  Past Medical History:  Diagnosis Date   ADHD dx feb 2022   no meds for    Allergy    Anemia    Anxiety    Family history of adverse reaction to anesthesia    daughter gets hiccups after anesthesia   GERD (gastroesophageal reflux disease)    Goiter right neck   dr  Dwyane Dee follows lov 01-28-2021   last [place] 04-03-2018   Hemiplegic migraine    pt dx 2004.  Pt had 1 seizure after this dx.  Not sure if related to dx or not. maw   History of asthma    none since age 66   History of depression    Seizures ([place])    2004. only 1 seizure   Stroke ([place]) 2019   mild no residual deficit found on mri   Syncope    occ with migraine fatigue with aura and migraine   Vitamin D deficiency     Past Surgical History:  Procedure Laterality Date   COLONOSCOPY  2018   DILATATION & CURETTAGE/HYSTEROSCOPY WITH MYOSURE N/A 07/11/2019   Procedure: [place];  Surgeon: Servando Salina, MD;  Location: [facility];  Service: Gynecology;  Laterality: N/A;   DILATATION & CURETTAGE/HYSTEROSCOPY WITH MYOSURE N/A 04/09/2021   Procedure: DILATATION & CURETTAGE/HYSTEROSCOPY WITH MYOSURE;  Surgeon: Servando Salina, MD;  Location: [facility];  Service: Gynecology;  Laterality: N/A;   LAPAROSCOPIC TUBAL LIGATION Bilateral 11/27/2020   Procedure: LAPAROSCOPIC TUBAL LIGATION with BIPOLAR CAUTERY;  Surgeon: Servando Salina, MD;  Location: [facility];  Service: Gynecology;  Laterality: Bilateral;   WISDOM TOOTH EXTRACTION     2009 x4 removed    Family History  Problem  Relation Age of Onset   Uterine cancer Mother    Alcohol abuse Mother    Arthritis Mother    Depression Mother    Drug abuse Mother    Heart disease Mother    Mental illness Mother    Stroke Mother    Kidney failure Father    Alcohol abuse Father    Diabetes Father    Prostate cancer Father    Colon cancer Father 66   Kidney disease Father    Migraines Father    Depression Brother    Migraines Brother    Colon cancer Maternal Aunt    Arthritis Maternal Grandmother    COPD Maternal Grandmother    Heart attack Maternal Grandmother    Hypertension Maternal Grandmother    Hyperlipidemia Maternal Grandmother     Arthritis Maternal Grandfather    Heart attack Maternal Grandfather    Hyperlipidemia Maternal Grandfather    Diabetes Paternal Grandfather    Depression Daughter    Asthma Daughter    Depression Daughter    Autism Daughter    Esophageal cancer Neg Hx    Pancreatic cancer Neg Hx    Rectal cancer Neg Hx    Stomach cancer Neg Hx    Thyroid disease Neg Hx     Current Outpatient Medications:    acetaminophen (TYLENOL) 500 MG tablet, Take 500 mg by mouth every 6 (six) hours as needed., Disp: , Rfl:    Ascorbic Acid (VITAMIN C) 100 MG tablet, Take 100 mg by mouth daily., Disp: , Rfl:    bisacodyl (DULCOLAX) 5 MG EC tablet, Take 10 mg by mouth as needed for moderate constipation., Disp: , Rfl:    butalbital-acetaminophen-caffeine (FIORICET, ESGIC) 50-325-40 MG tablet, Take 1 tablet by mouth every 6 (six) hours as needed for headache., Disp: 20 tablet, Rfl: 3   cetirizine (ZYRTEC) 10 MG tablet, Take 10 mg by mouth as needed. OTC, Disp: , Rfl:    Cholecalciferol (VITAMIN D3) 3000 units TABS, Take by mouth daily., Disp: , Rfl:    Cobalamin Combinations (B-12) [phone] MCG SUBL, B12, Disp: , Rfl:    ibuprofen (ADVIL) 800 MG tablet, Take 1 tablet (800 mg total) by mouth every 8 (eight) hours as needed., Disp: 30 tablet, Rfl: 5   Magnesium 100 MG CAPS, magnesium, Disp: , Rfl:    ondansetron (ZOFRAN-ODT) 4 MG disintegrating tablet, Take 1-2 tablets (4-8 mg total) by mouth every 8 (eight) hours as needed., Disp: 30 tablet, Rfl: 3   Pediatric Multivitamins-Iron ([place] COMPLETE) 10 MG CHEW, Chew by mouth as needed., Disp: , Rfl:    Rimegepant Sulfate (NURTEC) 75 MG TBDP, Take 75 mg by mouth daily as needed. For migraines. Take as close to onset of migraine as possible. One daily maximum., Disp: 8 tablet, Rfl: 0   Turmeric (QC TUMERIC COMPLEX PO), Take by mouth., Disp: , Rfl:    UNABLE TO FIND, Med Name: super green gummi by [place], Disp: , Rfl:    UNABLE TO FIND, daily. Med Name: Menopautonic :  hormonal transition, Disp: , Rfl:    UNABLE TO FIND, daily. Med Name: digest gold, Disp: , Rfl:    albuterol (VENTOLIN HFA) 108 (90 Base) MCG/ACT inhaler, Inhale 2 puffs into the lungs every 6 (six) hours as needed for wheezing or shortness of breath. (Patient not taking: Reported on 07/20/2022), Disp: 8 g, Rfl: 0   albuterol (VENTOLIN HFA) 108 (90 Base) MCG/ACT inhaler, Inhale 2 puffs into the lungs every 6 (six) hours as  needed for wheezing or shortness of breath. (Patient not taking: Reported on 07/20/2022), Disp: 6.7 g, Rfl: 0   aspirin EC 81 MG tablet, Take 81 mg by mouth daily. (Patient not taking: Reported on 04/18/2022), Disp: , Rfl:    atomoxetine (STRATTERA) 18 MG capsule, Take 1 capsule (18 mg total) by mouth daily. (Patient not taking: Reported on 01/09/2023), Disp: 30 capsule, Rfl: 0   Fremanezumab-vfrm (AJOVY) 225 MG/1.5ML SOAJ, Inject 225 mg into the skin every 30 (thirty) days. (Patient not taking: Reported on 07/20/2022), Disp: 1.5 mL, Rfl: 11   MELATONIN GUMMIES PO, Take 12 mg by mouth at bedtime. Takes 2 gummies at night (Patient not taking: Reported on 01/09/2023), Disp: , Rfl:    methylcellulose (CITRUCEL) oral powder, Take as directed daily (Patient not taking: Reported on 07/20/2022), Disp: , Rfl:    omeprazole (PRILOSEC) 20 MG capsule, Take 1 capsule (20 mg total) by mouth daily. (Patient not taking: Reported on 01/09/2023), Disp: 90 capsule, Rfl: 1  EXAM:  VITALS per patient if applicable:  RR between 12-20 bom  GENERAL: alert, oriented, appears well and in no acute distress  HEENT: atraumatic, conjunctiva clear, no obvious abnormalities on inspection of external nose and ears  NECK: normal movements of the head and neck  LUNGS: on inspection no signs of respiratory distress, breathing rate appears normal, no obvious gross SOB, gasping or wheezing  CV: no obvious cyanosis  MS: moves all visible extremities without noticeable abnormality  PSYCH/NEURO: pleasant and cooperative,  no obvious depression or anxiety, speech and thought processing grossly intact  ASSESSMENT AND PLAN:  Discussed the following assessment and plan:  Urticaria -Improving as occurring less frequently -Discussed possible causes of symptoms -Consider keeping a food diary to look for any patterns and symptoms -Continue self-care and counseling to decrease stress -OTC antihistamine such as Zyrtec, Claritin, Allegra during the day and Benadryl nightly as needed -Take a break from some of the over-the-counter supplements she is taking to see if she notices improvement in symptoms. -for continue symptoms referral to allergist     I discussed the assessment and treatment plan with the patient. The patient was provided an opportunity to ask questions and all were answered. The patient agreed with the plan and demonstrated an understanding of the instructions.   The patient was advised to call back or seek an in-person evaluation if the symptoms worsen or if the condition fails to improve as anticipated.  I provided 14 minutes of non-face-to-face time during this encounter.   Billie Ruddy, MD

## 2023-01-24 ENCOUNTER — Ambulatory Visit: Payer: 59 | Admitting: Gastroenterology

## 2023-02-22 ENCOUNTER — Encounter (HOSPITAL_BASED_OUTPATIENT_CLINIC_OR_DEPARTMENT_OTHER): Payer: Self-pay | Admitting: Emergency Medicine

## 2023-02-22 ENCOUNTER — Emergency Department (HOSPITAL_BASED_OUTPATIENT_CLINIC_OR_DEPARTMENT_OTHER): Payer: 59 | Admitting: Radiology

## 2023-02-22 ENCOUNTER — Emergency Department (HOSPITAL_BASED_OUTPATIENT_CLINIC_OR_DEPARTMENT_OTHER)
Admission: EM | Admit: 2023-02-22 | Discharge: 2023-02-22 | Disposition: A | Discharge status: Home / Self Care | Payer: 59 | Attending: Emergency Medicine | Admitting: Emergency Medicine

## 2023-02-22 ENCOUNTER — Other Ambulatory Visit: Payer: Self-pay

## 2023-02-22 DIAGNOSIS — Z8673 Personal history of transient ischemic attack (TIA), and cerebral infarction without residual deficits: Secondary | ICD-10-CM | POA: Insufficient documentation

## 2023-02-22 DIAGNOSIS — S60221A Contusion of right hand, initial encounter: Secondary | ICD-10-CM | POA: Diagnosis present

## 2023-02-22 DIAGNOSIS — J45909 Unspecified asthma, uncomplicated: Secondary | ICD-10-CM | POA: Diagnosis not present

## 2023-02-22 DIAGNOSIS — X58XXXA Exposure to other specified factors, initial encounter: Secondary | ICD-10-CM | POA: Diagnosis not present

## 2023-02-22 DIAGNOSIS — Z79899 Other long term (current) drug therapy: Secondary | ICD-10-CM | POA: Insufficient documentation

## 2023-02-22 NOTE — ED Provider Notes (Signed)
[facility] Provider Note   CSN: [id number] Arrival date & time: 02/22/23  1031     History  Chief Complaint  Patient presents with   Hand Pain    Whitney Kent is a 52 y.o. female.   Hand Pain  Patient presents with right hand pain.  Accidentally hit her right hand on the corner of a wall attempting to hit an insect pain at the base of her hand on the palmar aspect.  With movement of the thumb medially.    Past Medical History:  Diagnosis Date   ADHD dx feb 2022   no meds for    Allergy    Anemia    Anxiety    Family history of adverse reaction to anesthesia    daughter gets hiccups after anesthesia   GERD (gastroesophageal reflux disease)    Goiter right neck   dr Dwyane Dee follows lov 01-28-2021   last [place] 04-03-2018   Hemiplegic migraine    pt dx 2004.  Pt had 1 seizure after this dx.  Not sure if related to dx or not. maw   History of asthma    none since age 23   History of depression    Seizures ([place])    2004. only 1 seizure   Stroke ([place]) 2019   mild no residual deficit found on mri   Syncope    occ with migraine fatigue with aura and migraine   Vitamin D deficiency     Home Medications Prior to Admission medications   Medication Sig Start Date End Date Taking? Authorizing Provider  acetaminophen (TYLENOL) 500 MG tablet Take 500 mg by mouth every 6 (six) hours as needed.    [provider]  albuterol (VENTOLIN HFA) 108 (90 Base) MCG/ACT inhaler Inhale 2 puffs into the lungs every 6 (six) hours as needed for wheezing or shortness of breath. Patient not taking: Reported on 07/20/2022 03/24/22   Billie Ruddy, MD  albuterol (VENTOLIN HFA) 108 (90 Base) MCG/ACT inhaler Inhale 2 puffs into the lungs every 6 (six) hours as needed for wheezing or shortness of breath. Patient not taking: Reported on 07/20/2022 04/18/22   Billie Ruddy, MD  Ascorbic Acid (VITAMIN C) 100 MG tablet Take 100 mg by mouth daily.     [provider]  aspirin EC 81 MG tablet Take 81 mg by mouth daily. Patient not taking: Reported on 04/18/2022    [provider]  atomoxetine (STRATTERA) 18 MG capsule Take 1 capsule (18 mg total) by mouth daily. Patient not taking: Reported on 01/09/2023 07/20/22   Billie Ruddy, MD  bisacodyl (DULCOLAX) 5 MG EC tablet Take 10 mg by mouth as needed for moderate constipation.    [provider]  butalbital-acetaminophen-caffeine (FIORICET, ESGIC) [phone] MG tablet Take 1 tablet by mouth every 6 (six) hours as needed for headache. 03/27/18   Melvenia Beam, MD  cetirizine (ZYRTEC) 10 MG tablet Take 10 mg by mouth as needed. OTC    [provider]  Cholecalciferol (VITAMIN D3) 3000 units TABS Take by mouth daily.    [provider]  Cobalamin Combinations (B-12) [phone] MCG SUBL B12    [provider]  Fremanezumab-vfrm (AJOVY) 225 MG/1.5ML SOAJ Inject 225 mg into the skin every 30 (thirty) days. Patient not taking: Reported on 07/20/2022 04/19/22   Melvenia Beam, MD  ibuprofen (ADVIL) 800 MG tablet Take 1 tablet (800 mg total) by mouth every 8 (  eight) hours as needed. 07/11/19   Servando Salina, MD  Magnesium 100 MG CAPS magnesium    [provider]  MELATONIN GUMMIES PO Take 12 mg by mouth at bedtime. Takes 2 gummies at night Patient not taking: Reported on 01/09/2023    [provider]  methylcellulose (CITRUCEL) oral powder Take as directed daily Patient not taking: Reported on 07/20/2022 08/27/21   Yetta Flock, MD  omeprazole (PRILOSEC) 20 MG capsule Take 1 capsule (20 mg total) by mouth daily. Patient not taking: Reported on 01/09/2023 08/27/21   Yetta Flock, MD  ondansetron (ZOFRAN-ODT) 4 MG disintegrating tablet Take 1-2 tablets (4-8 mg total) by mouth every 8 (eight) hours as needed. 04/19/22   Melvenia Beam, MD  Pediatric Multivitamins-Iron [facility] COMPLETE) 10 MG CHEW Chew by mouth as needed.     [provider]  Rimegepant Sulfate (NURTEC) 75 MG TBDP Take 75 mg by mouth daily as needed. For migraines. Take as close to onset of migraine as possible. One daily maximum. 04/19/22   Melvenia Beam, MD  Turmeric (QC TUMERIC COMPLEX PO) Take by mouth.    [provider]  UNABLE TO FIND Med Name: super green gummi by [facility]    [provider]  UNABLE TO FIND daily. Med Name: Menopautonic : hormonal transition    [provider]  UNABLE TO FIND daily. Med Name: digest gold    [provider]      Allergies    Patient has no known allergies.    Review of Systems   Review of Systems  Physical Exam Updated Vital Signs BP 116/79 (BP Location: Right Arm)   Pulse 83   Temp 98.2 F (36.8 C) (Oral)   Resp 17   LMP 04/20/2021   SpO2 100%  Physical Exam Vitals reviewed.  Musculoskeletal:     Comments:   Sensation grossly intact over the hand.  Tenderness at the base of the thenar area.  No pain with axial loading on the thumb.  No pain with flexion next at the wrist.  There is tenderness however with direct palpation.  Neurological:     Mental Status: She is alert.     ED Results / Procedures / Treatments   Labs (all labs ordered are listed, but only abnormal results are displayed) Labs Reviewed - No data to display  EKG None  Radiology DG Hand Complete Right  Result Date: 02/22/2023 CLINICAL DATA:  Pain after trauma EXAM: RIGHT HAND - COMPLETE 3 VIEW COMPARISON:  None Available. FINDINGS: There is no evidence of fracture or dislocation. There is no evidence of arthropathy or other focal bone abnormality. Soft tissues are unremarkable. IMPRESSION: No acute osseous abnormality Electronically Signed   By: Jill Side M.D.   On: 02/22/2023 11:09    Procedures Procedures    Medications Ordered in ED Medications - No data to display  ED Course/ Medical Decision Making/ A&P                             Medical Decision Making Amount  and/or Complexity of Data Reviewed Radiology: ordered.   Patient with blunt trauma to hand.  Tenderness at the area about the base of the first metacarpal.  Will get x-ray imaging.  Differential diagnosis includes fracture and musculoskeletal blunt trauma.   x-ray reassuring.  No fracture seen.  Independently interpreted by me.  Velcro thumb spica given for comfort.  Outpatient  follow-up with PCP as needed.             Final Clinical Impression(s) / ED Diagnoses Final diagnoses:  Contusion of right hand, initial encounter    Rx / DC Orders ED Discharge Orders     None         Davonna Belling, MD 02/22/23 1136

## 2023-02-22 NOTE — ED Triage Notes (Addendum)
Pt reports hitting the heel of her right hand on the corner of a wall this morning.

## 2023-04-20 ENCOUNTER — Encounter: Payer: Self-pay | Admitting: Family Medicine

## 2023-04-20 ENCOUNTER — Ambulatory Visit (INDEPENDENT_AMBULATORY_CARE_PROVIDER_SITE_OTHER): Payer: Managed Care, Other (non HMO) | Admitting: Family Medicine

## 2023-04-20 VITALS — BP 112/72 | HR 93 | Temp 98.5°F | Ht 63.0 in | Wt 149.6 lb

## 2023-04-20 DIAGNOSIS — E049 Nontoxic goiter, unspecified: Secondary | ICD-10-CM | POA: Diagnosis not present

## 2023-04-20 DIAGNOSIS — Z0000 Encounter for general adult medical examination without abnormal findings: Secondary | ICD-10-CM | POA: Diagnosis not present

## 2023-04-20 DIAGNOSIS — K5909 Other constipation: Secondary | ICD-10-CM | POA: Diagnosis not present

## 2023-04-20 DIAGNOSIS — R000 Tachycardia, unspecified: Secondary | ICD-10-CM | POA: Diagnosis not present

## 2023-04-20 DIAGNOSIS — F902 Attention-deficit hyperactivity disorder, combined type: Secondary | ICD-10-CM | POA: Diagnosis not present

## 2023-04-20 DIAGNOSIS — E538 Deficiency of other specified B group vitamins: Secondary | ICD-10-CM

## 2023-04-20 DIAGNOSIS — Z0001 Encounter for general adult medical examination with abnormal findings: Secondary | ICD-10-CM

## 2023-04-20 LAB — CBC WITH DIFFERENTIAL/PLATELET
Basophils Absolute: 0 10*3/uL (ref 0.0–0.1)
Basophils Relative: 0.1 % (ref 0.0–3.0)
Eosinophils Absolute: 0.2 10*3/uL (ref 0.0–0.7)
Eosinophils Relative: 2.8 % (ref 0.0–5.0)
HCT: 41.4 % (ref 36.0–46.0)
Hemoglobin: 13.6 g/dL (ref 12.0–15.0)
Lymphocytes Relative: 29.7 % (ref 12.0–46.0)
Lymphs Abs: 1.7 10*3/uL (ref 0.7–4.0)
MCHC: 32.9 g/dL (ref 30.0–36.0)
MCV: 81 fl (ref 78.0–100.0)
Monocytes Absolute: 0.3 10*3/uL (ref 0.1–1.0)
Monocytes Relative: 5.8 % (ref 3.0–12.0)
Neutro Abs: 3.5 10*3/uL (ref 1.4–7.7)
Neutrophils Relative %: 61.6 % (ref 43.0–77.0)
Platelets: 254 10*3/uL (ref 150.0–400.0)
RBC: 5.11 Mil/uL (ref 3.87–5.11)
RDW: 13.3 % (ref 11.5–15.5)
WBC: 5.7 10*3/uL (ref 4.0–10.5)

## 2023-04-20 LAB — COMPREHENSIVE METABOLIC PANEL
ALT: 10 U/L (ref 0–35)
AST: 15 U/L (ref 0–37)
Albumin: 4.1 g/dL (ref 3.5–5.2)
Alkaline Phosphatase: 63 U/L (ref 39–117)
BUN: 14 mg/dL (ref 6–23)
CO2: 29 mEq/L (ref 19–32)
Calcium: 9.5 mg/dL (ref 8.4–10.5)
Chloride: 104 mEq/L (ref 96–112)
Creatinine, Ser: 0.82 mg/dL (ref 0.40–1.20)
GFR: 83.58 mL/min (ref >60.00)
Glucose, Bld: 88 mg/dL (ref 70–99)
Potassium: 4.9 mEq/L (ref 3.5–5.1)
Sodium: 140 mEq/L (ref 135–145)
Total Bilirubin: 0.4 mg/dL (ref 0.2–1.2)
Total Protein: 6.9 g/dL (ref 6.0–8.3)

## 2023-04-20 LAB — LIPID PANEL
Cholesterol: 163 mg/dL (ref 0–200)
HDL: 80.1 mg/dL (ref >39.00)
LDL Cholesterol: 68 mg/dL (ref 0–99)
NonHDL: 83.03
Total CHOL/HDL Ratio: 2
Triglycerides: 76 mg/dL (ref 0.0–149.0)
VLDL: 15.2 mg/dL (ref 0.0–40.0)

## 2023-04-20 LAB — VITAMIN D 25 HYDROXY (VIT D DEFICIENCY, FRACTURES): VITD: 24.66 ng/mL — ABNORMAL LOW (ref 30.00–100.00)

## 2023-04-20 LAB — T4, FREE: Free T4: 1.04 ng/dL (ref 0.60–1.60)

## 2023-04-20 LAB — TSH: TSH: 0.47 u[IU]/mL (ref 0.35–5.50)

## 2023-04-20 LAB — HEMOGLOBIN A1C: Hgb A1c MFr Bld: 5.9 % (ref 4.6–6.5)

## 2023-04-20 NOTE — Progress Notes (Signed)
Established Patient Office Visit   Subjective  Patient ID: Whitney Kent, female    DOB: 25-Dec-1970  Age: 52 y.o. MRN: [id number]  Chief Complaint  Patient presents with   Annual Exam    Pt is a 52 yo female seen for CPE and f/u on ongoing concerns.  Pt has an OB/GYN appt later today.  Endorses hot flashes/menopausal symptoms.  Last menses 3 years ago.  Not currently taking ADHD medication as Adderall helped with focus but caused a headache.  Headache was not like patient's typical migraines but more of a mild pain without aura and left parietal area.  Patient endorses continued chronic constipation.  Has GI appointment scheduled.  Taking fiber Gummies, magnesium, MiraLAX as needed and Dulcolax as needed.  Eating mostly plant-based diet.  Inquires about multivitamins.     ROS Negative unless stated above    Objective:     BP 112/72 (BP Location: Left Arm, Patient Position: Sitting, Cuff Size: Normal)   Pulse 93   Temp 98.5 F (36.9 C) (Oral)   Ht 5\' 3"  (1.6 m)   Wt 149 lb 9.6 oz (67.9 kg)   LMP 04/20/2021   SpO2 99%   BMI 26.50 kg/m    Physical Exam Constitutional:      Appearance: Normal appearance.  HENT:     Head: Normocephalic and atraumatic.     Right Ear: Tympanic membrane, ear canal and external ear normal.     Left Ear: Tympanic membrane, ear canal and external ear normal.     Nose: Nose normal.     Mouth/Throat:     Mouth: Mucous membranes are moist.     Pharynx: No oropharyngeal exudate or posterior oropharyngeal erythema.  Eyes:     General: No scleral icterus.    Extraocular Movements: Extraocular movements intact.     Conjunctiva/sclera: Conjunctivae normal.     Pupils: Pupils are equal, round, and reactive to light.  Neck:     Thyroid: Thyromegaly present.  Cardiovascular:     Rate and Rhythm: Normal rate and regular rhythm.     Pulses: Normal pulses.     Heart sounds: Normal heart sounds. No murmur heard.    No friction rub.   Pulmonary:     Effort: Pulmonary effort is normal.     Breath sounds: Normal breath sounds. No wheezing, rhonchi or rales.  Abdominal:     General: Bowel sounds are normal.     Palpations: Abdomen is soft.     Tenderness: There is no abdominal tenderness.  Musculoskeletal:        General: No deformity. Normal range of motion.  Lymphadenopathy:     Cervical: No cervical adenopathy.  Skin:    General: Skin is warm and dry.     Findings: No lesion.  Neurological:     General: No focal deficit present.     Mental Status: She is alert and oriented to person, place, and time.  Psychiatric:        Mood and Affect: Mood normal.        Thought Content: Thought content normal.      No results found for any visits on 04/20/23.    Assessment & Plan:  Well adult exam -Age-appropriate health screenings discussed -Immunizations reviewed -Colonoscopy done 11/20/2017 -Patient to schedule mammogram -Immunizations reviewed -Neck CPE in 1 year -     VITAMIN D 25 Hydroxy (Vit-D Deficiency, Fractures) -     Hemoglobin A1c  Tachycardia -Referral to cardiology  for Holter monitor -Discussed supportive care including limiting caffeine, decreasing stress, etc. -     Comprehensive metabolic panel -     CBC with Differential/Platelet -     TSH -     T4, free -     Lipid panel -     Ambulatory referral to Cardiology  Attention deficit hyperactivity disorder (ADHD), combined type -Adderall caused headache  Vitamin B 12 deficiency -     CBC with Differential/Platelet  Goiter -History of goiter, stable on exam -     TSH -     T4, free  Chronic constipation -Discussed diet modification -Continue follow-up with GI -     Comprehensive metabolic panel -     CBC with Differential/Platelet -     TSH -     T4, free    Return if symptoms worsen or fail to improve.   Deeann Saint, MD

## 2023-04-21 ENCOUNTER — Other Ambulatory Visit: Payer: Self-pay | Admitting: Family Medicine

## 2023-04-21 DIAGNOSIS — E559 Vitamin D deficiency, unspecified: Secondary | ICD-10-CM

## 2023-04-21 MED ORDER — VITAMIN D (ERGOCALCIFEROL) 1.25 MG (50000 UNIT) PO CAPS: 50000.0000 [IU] | ORAL_CAPSULE | ORAL | 0 refills | Status: DC

## 2023-05-08 ENCOUNTER — Encounter: Payer: Self-pay | Admitting: Family Medicine

## 2023-05-24 ENCOUNTER — Ambulatory Visit: Payer: Managed Care, Other (non HMO) | Admitting: Nurse Practitioner

## 2023-06-01 ENCOUNTER — Encounter: Payer: Self-pay | Admitting: Internal Medicine

## 2023-06-01 ENCOUNTER — Ambulatory Visit (INDEPENDENT_AMBULATORY_CARE_PROVIDER_SITE_OTHER): Payer: Managed Care, Other (non HMO)

## 2023-06-01 ENCOUNTER — Ambulatory Visit: Payer: Managed Care, Other (non HMO) | Attending: Internal Medicine | Admitting: Internal Medicine

## 2023-06-01 VITALS — BP 93/69 | HR 86 | Ht 62.5 in | Wt 146.2 lb

## 2023-06-01 DIAGNOSIS — R002 Palpitations: Secondary | ICD-10-CM

## 2023-06-01 NOTE — Progress Notes (Signed)
Cardiology Office Note:    Date:  06/01/2023   ID:  Rennie Natter, DOB 1971-06-27, MRN [id number]  PCP:  Deeann Saint, MD   [facility] Providers Cardiologist:  None     Referring MD: Deeann Saint, MD   No chief complaint on file. Palpitations  History of Present Illness:    Whitney Kent is a 52 y.o. female with a hx of GERD, migraine, anxiety referral for palpitations. She is experiencing menopause symptoms. She notes she wakes up with feeling of heart racing. She notes she hears her heart beat. She has a high stress job. She works for the [employer]. 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   Past Medical History:  Diagnosis Date   ADHD dx feb 2022   no meds for    Allergy    Anemia    Anxiety    Family history of adverse reaction to anesthesia    daughter gets hiccups after anesthesia   GERD (gastroesophageal reflux disease)    Goiter right neck   dr Lucianne Muss follows lov 01-28-2021   last [place] 04-03-2018   Hemiplegic migraine    pt dx 2004.  Pt had 1 seizure after this dx.  Not sure if related to dx or not. maw   History of asthma    none since age 66   History of depression    Seizures (HCC)    2004. only 1 seizure   Stroke (HCC) 2019   mild no residual deficit found on mri   Syncope    occ with migraine fatigue with aura and migraine   Vitamin D deficiency     Past Surgical History:  Procedure Laterality Date   COLONOSCOPY  2018   DILATATION & CURETTAGE/HYSTEROSCOPY WITH MYOSURE N/A 07/11/2019   Procedure: DILATATION & CURETTAGE/HYSTEROSCOPY WITH MYOSURE;  Surgeon: Maxie Better, MD;  Location: [facility];  Service: Gynecology;  Laterality: N/A;   DILATATION & CURETTAGE/HYSTEROSCOPY WITH MYOSURE N/A 04/09/2021   Procedure: DILATATION & CURETTAGE/HYSTEROSCOPY WITH MYOSURE;  Surgeon: Maxie Better, MD;  Location: [facility];  Service: Gynecology;  Laterality: N/A;    LAPAROSCOPIC TUBAL LIGATION Bilateral 11/27/2020   Procedure: LAPAROSCOPIC TUBAL LIGATION with BIPOLAR CAUTERY;  Surgeon: Maxie Better, MD;  Location: [facility];  Service: Gynecology;  Laterality: Bilateral;   WISDOM TOOTH EXTRACTION     2009 x4 removed    Current Medications: Current Outpatient Medications on File Prior to Visit  Medication Sig Dispense Refill   acetaminophen (TYLENOL) 500 MG tablet Take 500 mg by mouth every 6 (six) hours as needed.     Ascorbic Acid (VITAMIN C) 100 MG tablet Take 100 mg by mouth daily.     aspirin EC 81 MG tablet Take 81 mg by mouth daily.     atomoxetine (STRATTERA) 18 MG capsule Take 1 capsule (18 mg total) by mouth daily. 30 capsule 0   bisacodyl (DULCOLAX) 5 MG EC tablet Take 10 mg by mouth as needed for moderate constipation.     buPROPion (WELLBUTRIN) 100 MG tablet Take 1 tablet by mouth 2 (two) times daily.     butalbital-acetaminophen-caffeine (FIORICET, ESGIC) 50-325-40 MG tablet Take 1 tablet by mouth every 6 (six) hours as needed for headache. 20 tablet 3   cetirizine (ZYRTEC) 10 MG tablet Take 10 mg by mouth as needed. OTC     Cholecalciferol (VITAMIN D3) 3000 units TABS Take by mouth daily.  Cobalamin Combinations (B-12) [phone] MCG SUBL B12     Fremanezumab-vfrm (AJOVY) 225 MG/1.5ML SOAJ Inject 225 mg into the skin every 30 (thirty) days. 1.5 mL 11   ibuprofen (ADVIL) 800 MG tablet Take 1 tablet (800 mg total) by mouth every 8 (eight) hours as needed. 30 tablet 5   Magnesium 100 MG CAPS magnesium     MELATONIN GUMMIES PO Take 12 mg by mouth at bedtime. Takes 2 gummies at night     methylcellulose (CITRUCEL) oral powder Take as directed daily     omeprazole (PRILOSEC) 20 MG capsule Take 1 capsule (20 mg total) by mouth daily. 90 capsule 1   ondansetron (ZOFRAN-ODT) 4 MG disintegrating tablet Take 1-2 tablets (4-8 mg total) by mouth every 8 (eight) hours as needed. 30 tablet 3   Pediatric Multivitamins-Iron  (FLINTSTONES COMPLETE) 10 MG CHEW Chew by mouth as needed.     Rimegepant Sulfate (NURTEC) 75 MG TBDP Take 75 mg by mouth daily as needed. For migraines. Take as close to onset of migraine as possible. One daily maximum. 8 tablet 0   Turmeric (QC TUMERIC COMPLEX PO) Take by mouth.     UNABLE TO FIND Med Name: super green gummi by Goli     UNABLE TO FIND daily. Med Name: digest gold     Vitamin D, Ergocalciferol, (DRISDOL) 1.25 MG (50000 UNIT) CAPS capsule Take 1 capsule (50,000 Units total) by mouth every 7 (seven) days. 12 capsule 0   albuterol (VENTOLIN HFA) 108 (90 Base) MCG/ACT inhaler Inhale 2 puffs into the lungs every 6 (six) hours as needed for wheezing or shortness of breath. (Patient not taking: Reported on 07/20/2022) 8 g 0   albuterol (VENTOLIN HFA) 108 (90 Base) MCG/ACT inhaler Inhale 2 puffs into the lungs every 6 (six) hours as needed for wheezing or shortness of breath. (Patient not taking: Reported on 07/20/2022) 6.7 g 0   UNABLE TO FIND daily. Med Name: Menopautonic : hormonal transition (Patient not taking: Reported on 06/01/2023)     No current facility-administered medications on file prior to visit.     Allergies:   Patient has no known allergies.   Social History   Socioeconomic History   Marital status: Divorced    Spouse name: Not on file   Number of children: 2   Years of education: 18   Highest education level: Some college, no degree  Occupational History   Not on file  Tobacco Use   Smoking status: Never   Smokeless tobacco: Never  Vaping Use   Vaping Use: Never used  Substance and Sexual Activity   Alcohol use: Yes    Comment: ocas   Drug use: No   Sexual activity: Yes    Partners: Male    Birth control/protection: Condom  Other Topics Concern   Not on file  Social History Narrative   Lives at home with her daughter   Right handed   Drinks occasional caffeine   Social Determinants of Health   Financial Resource Strain: Not on file  Food  Insecurity: Not on file  Transportation Needs: Not on file  Physical Activity: Not on file  Stress: Not on file  Social Connections: Not on file     Family History: The patient's family history includes Alcohol abuse in her father and mother; Arthritis in her maternal grandfather, maternal grandmother, and mother; Asthma in her daughter; Autism in her daughter; COPD in her maternal grandmother; Colon cancer in her maternal aunt; Colon cancer (age  of onset: 90) in her father; Depression in her brother, daughter, daughter, and mother; Diabetes in her father and paternal grandfather; Drug abuse in her mother; Heart attack in her maternal grandfather and maternal grandmother; Heart disease in her mother; Hyperlipidemia in her maternal grandfather and maternal grandmother; Hypertension in her maternal grandmother; Kidney disease in her father; Kidney failure in her father; Mental illness in her mother; Migraines in her brother and father; Prostate cancer in her father; Stroke in her mother; Uterine cancer in her mother. There is no history of Esophageal cancer, Pancreatic cancer, Rectal cancer, Stomach cancer, or Thyroid disease.  ROS:   Please see the history of present illness.     All other systems reviewed and are negative.  EKGs/Labs/Other Studies Reviewed:    The following studies were reviewed today:   EKG:  EKG is  ordered today.  The ekg ordered today demonstrates   06/01/2023- NSR  Recent Labs: 04/20/2023: ALT 10; BUN 14; Creatinine, Ser 0.82; Hemoglobin 13.6; Platelets 254.0; Potassium 4.9; Sodium 140; TSH 0.47   Recent Lipid Panel    Component Value Date/Time   CHOL 163 04/20/2023 1049   TRIG 76.0 04/20/2023 1049   HDL 80.10 04/20/2023 1049   CHOLHDL 2 04/20/2023 1049   VLDL 15.2 04/20/2023 1049   LDLCALC 68 04/20/2023 1049     Risk Assessment/Calculations:     Physical Exam:    VS:  Vitals:   06/01/23 0814  BP: 93/69  Pulse: 86  SpO2: 97%     BP 93/69 (BP  Location: Left Arm, Patient Position: Sitting, Cuff Size: Normal)   Pulse 86   Ht 5' 2.5" (1.588 m)   Wt 146 lb 3.2 oz (66.3 kg)   LMP 04/20/2021   SpO2 97%   BMI 26.31 kg/m     Wt Readings from Last 3 Encounters:  06/01/23 146 lb 3.2 oz (66.3 kg)  04/20/23 149 lb 9.6 oz (67.9 kg)  07/20/22 140 lb 6.4 oz (63.7 kg)     GEN:  Well nourished, well developed in no acute distress HEENT: Normal CARDIAC: RRR, no murmurs, rubs, gallops RESPIRATORY:  Clear to auscultation without rales, wheezing or rhonchi  ABDOMEN: Soft, non-tender, non-distended MUSCULOSKELETAL:  No edema; No deformity  SKIN: Warm and dry NEUROLOGIC:  Alert and oriented x 3 PSYCHIATRIC:  Normal affect   ASSESSMENT:    Palpitations: Will get a monitor to ensure she has no concerning arrhythmia. TSH is nl. Hgb nl. Limit caffeine. Discussed continued coping for stress. If significant AT can consider propanolol PLAN:    In order of problems listed above:  3 day ziopatch Follow up PRN     Medication Adjustments/Labs and Tests Ordered: Current medicines are reviewed at length with the patient today.  Concerns regarding medicines are outlined above.  No orders of the defined types were placed in this encounter.  No orders of the defined types were placed in this encounter.   There are no Patient Instructions on file for this visit.   Signed, Maisie Fus, MD  06/01/2023 8:31 AM    [facility]

## 2023-06-01 NOTE — Progress Notes (Unsigned)
Enrolled patient for a 3 day Zio XT monitor to be mailed to patients home  

## 2023-06-01 NOTE — Patient Instructions (Signed)
Medication Instructions:  Your physician recommends that you continue on your current medications as directed. Please refer to the Current Medication list given to you today.   *If you need a refill on your cardiac medications before your next appointment, please call your pharmacy*   Lab Work: No labs ordered today  If you have labs (blood work) drawn today and your tests are completely normal, you will receive your results only by: MyChart Message (if you have MyChart) OR A paper copy in the mail If you have any lab test that is abnormal or we need to change your treatment, we will call you to review the results.   Testing/Procedures:  Christena Deem- Long Term Monitor Instructions   Your physician has requested you wear your ZIO patch monitor__3__days.   This is a single patch monitor.  [employer] supplies one patch monitor per enrollment.  Additional stickers are not available.   Please do not apply patch if you will be having a Nuclear Stress Test, Echocardiogram, Cardiac CT, MRI, or Chest Xray during the time frame you would be wearing the monitor. The patch cannot be worn during these tests.  You cannot remove and re-apply the ZIO XT patch monitor.   Your ZIO patch monitor will be sent USPS Priority mail from [facility] directly to your home address. The monitor may also be mailed to a PO BOX if home delivery is not available.   It may take 3-5 days to receive your monitor after you have been enrolled.   Once you have received you monitor, please review enclosed instructions.  Your monitor has already been registered assigning a specific monitor serial # to you.   Applying the monitor   Shave hair from upper left chest.   Hold abrader disc by orange tab.  Rub abrader in 40 strokes over left upper chest as indicated in your monitor instructions.   Clean area with 4 enclosed alcohol pads .  Use all pads to assure are is cleaned thoroughly.  Let dry.   Apply patch as indicated  in monitor instructions.  Patch will be place under collarbone on left side of chest with arrow pointing upward.   Rub patch adhesive wings for 2 minutes.Remove white label marked "1".  Remove white label marked "2".  Rub patch adhesive wings for 2 additional minutes.   While looking in a mirror, press and release button in center of patch.  A small green light will flash 3-4 times .  This will be your only indicator the monitor has been turned on.     Do not shower for the first 24 hours.  You may shower after the first 24 hours.   Press button if you feel a symptom. You will hear a small click.  Record Date, Time and Symptom in the Patient Log Book.   When you are ready to remove patch, follow instructions on last 2 pages of Patient Log Book.  Stick patch monitor onto last page of Patient Log Book.   Place Patient Log Book in [place] box.  Use locking tab on box and tape box closed securely.  The [employer] and [employer] has [employer] on it.  Please place in mailbox as soon as possible.  Your physician should have your test results approximately 7 days after the monitor has been mailed back to [facility].   Call [facility] Customer Care at [phone] if you have questions regarding your ZIO XT patch monitor.  Call them immediately if  you see an orange light blinking on your monitor.   If your monitor falls off in less than 4 days contact our Monitor department at [phone].  If your monitor becomes loose or falls off after 4 days call [employer] at [phone] for suggestions on securing your monitor.     Follow-Up: At [facility], you and your health needs are our priority.  As part of our continuing mission to provide you with exceptional heart care, we have created designated Provider Care Teams.  These Care Teams include your primary Cardiologist (physician) and Advanced Practice Providers (APPs -  Physician Assistants and Nurse Practitioners) who all work together to  provide you with the care you need, when you need it.  We recommend signing up for the patient portal called "MyChart".  Sign up information is provided on this After Visit Summary.  MyChart is used to connect with patients for Virtual Visits (Telemedicine).  Patients are able to view lab/test results, encounter notes, upcoming appointments, etc.  Non-urgent messages can be sent to your provider as well.   To learn more about what you can do with MyChart, go to [email or web address].    Your next appointment:    As needed with Dr. Carolan Clines  Provider:    {If Card or EP not listed click to update

## 2023-06-08 ENCOUNTER — Emergency Department (HOSPITAL_BASED_OUTPATIENT_CLINIC_OR_DEPARTMENT_OTHER)
Admission: EM | Admit: 2023-06-08 | Discharge: 2023-06-08 | Disposition: A | Discharge status: Home / Self Care | Payer: Managed Care, Other (non HMO) | Attending: Emergency Medicine | Admitting: Emergency Medicine

## 2023-06-08 ENCOUNTER — Encounter (HOSPITAL_BASED_OUTPATIENT_CLINIC_OR_DEPARTMENT_OTHER): Payer: Self-pay | Admitting: Emergency Medicine

## 2023-06-08 ENCOUNTER — Emergency Department (HOSPITAL_BASED_OUTPATIENT_CLINIC_OR_DEPARTMENT_OTHER): Payer: Managed Care, Other (non HMO)

## 2023-06-08 DIAGNOSIS — M79662 Pain in left lower leg: Secondary | ICD-10-CM | POA: Diagnosis not present

## 2023-06-08 DIAGNOSIS — Z7982 Long term (current) use of aspirin: Secondary | ICD-10-CM | POA: Insufficient documentation

## 2023-06-08 DIAGNOSIS — M7989 Other specified soft tissue disorders: Secondary | ICD-10-CM

## 2023-06-08 DIAGNOSIS — R002 Palpitations: Secondary | ICD-10-CM

## 2023-06-08 DIAGNOSIS — R531 Weakness: Secondary | ICD-10-CM

## 2023-06-08 DIAGNOSIS — R791 Abnormal coagulation profile: Secondary | ICD-10-CM | POA: Insufficient documentation

## 2023-06-08 LAB — CBC WITH DIFFERENTIAL/PLATELET
Abs Immature Granulocytes: 0.01 10*3/uL (ref 0.00–0.07)
Basophils Absolute: 0 10*3/uL (ref 0.0–0.1)
Basophils Relative: 0 %
Eosinophils Absolute: 0.3 10*3/uL (ref 0.0–0.5)
Eosinophils Relative: 4 %
HCT: 36.3 % (ref 36.0–46.0)
Hemoglobin: 12.1 g/dL (ref 12.0–15.0)
Immature Granulocytes: 0 %
Lymphocytes Relative: 35 %
Lymphs Abs: 2.5 10*3/uL (ref 0.7–4.0)
MCH: 26.9 pg (ref 26.0–34.0)
MCHC: 33.3 g/dL (ref 30.0–36.0)
MCV: 80.8 fL (ref 80.0–100.0)
Monocytes Absolute: 0.5 10*3/uL (ref 0.1–1.0)
Monocytes Relative: 7 %
Neutro Abs: 3.8 10*3/uL (ref 1.7–7.7)
Neutrophils Relative %: 54 %
Platelets: 252 10*3/uL (ref 150–400)
RBC: 4.49 MIL/uL (ref 3.87–5.11)
RDW: 12.8 % (ref 11.5–15.5)
WBC: 7 10*3/uL (ref 4.0–10.5)
nRBC: 0 % (ref 0.0–0.2)

## 2023-06-08 LAB — BASIC METABOLIC PANEL
Anion gap: 8 (ref 5–15)
BUN: 14 mg/dL (ref 6–20)
CO2: 26 mmol/L (ref 22–32)
Calcium: 8.6 mg/dL — ABNORMAL LOW (ref 8.9–10.3)
Chloride: 107 mmol/L (ref 98–111)
Creatinine, Ser: 0.66 mg/dL (ref 0.44–1.00)
GFR, Estimated: >60 mL/min (ref >60)
Glucose, Bld: 122 mg/dL — ABNORMAL HIGH (ref 70–99)
Potassium: 3.5 mmol/L (ref 3.5–5.1)
Sodium: 141 mmol/L (ref 135–145)

## 2023-06-08 LAB — D-DIMER, QUANTITATIVE: D-Dimer, Quant: 0.51 ug/mL-FEU — ABNORMAL HIGH (ref 0.00–0.50)

## 2023-06-08 LAB — TROPONIN I (HIGH SENSITIVITY): Troponin I (High Sensitivity): <2 ng/L (ref <18)

## 2023-06-08 MED ORDER — SODIUM CHLORIDE 0.9 % IV BOLUS
1000.0000 mL | Freq: Once | INTRAVENOUS | Status: AC
  Administered 2023-06-08: 1000 mL via INTRAVENOUS

## 2023-06-08 MED ORDER — IOHEXOL 350 MG/ML SOLN
100.0000 mL | Freq: Once | INTRAVENOUS | Status: AC | PRN
  Administered 2023-06-08: 75 mL via INTRAVENOUS

## 2023-06-08 NOTE — ED Notes (Signed)
Discharge instructions discussed with pt. Pt verbalized understanding. Pt stable and ambulatory.  °

## 2023-06-08 NOTE — ED Triage Notes (Signed)
Left leg swelling and pain. Noticed last night  Some mid sternal chest pain  Denies SOB  Some mental fog today, and fatigue

## 2023-06-08 NOTE — ED Provider Notes (Signed)
[facility] AT [facility] Provider Note   CSN: [id number] Arrival date & time: 06/08/23  2036     History  Chief Complaint  Patient presents with   Leg Swelling   Palpitations    Whitney Kent is a 52 y.o. female.  Patient here with some left lower leg pain mostly behind the knee and upper calf and hamstring, some palpitations here recently.  Supposed have a Zio patch mailed to her soon.  She not having any chest pain or shortness of breath but feels little bit fatigued and some mental fog.  Denies any headache numbness weakness or chills or fever.  No history of blood clots.  No recent surgery or travel.  The history is provided by the patient.       Home Medications Prior to Admission medications   Medication Sig Start Date End Date Taking? Authorizing Provider  acetaminophen (TYLENOL) 500 MG tablet Take 500 mg by mouth every 6 (six) hours as needed.    [provider]  albuterol (VENTOLIN HFA) 108 (90 Base) MCG/ACT inhaler Inhale 2 puffs into the lungs every 6 (six) hours as needed for wheezing or shortness of breath. Patient not taking: Reported on 07/20/2022 03/24/22   Deeann Saint, MD  albuterol (VENTOLIN HFA) 108 (90 Base) MCG/ACT inhaler Inhale 2 puffs into the lungs every 6 (six) hours as needed for wheezing or shortness of breath. Patient not taking: Reported on 07/20/2022 04/18/22   Deeann Saint, MD  Ascorbic Acid (VITAMIN C) 100 MG tablet Take 100 mg by mouth daily.    [provider]  aspirin EC 81 MG tablet Take 81 mg by mouth daily.    [provider]  atomoxetine (STRATTERA) 18 MG capsule Take 1 capsule (18 mg total) by mouth daily. 07/20/22   Deeann Saint, MD  bisacodyl (DULCOLAX) 5 MG EC tablet Take 10 mg by mouth as needed for moderate constipation.    [provider]  buPROPion (WELLBUTRIN) 100 MG tablet Take 1 tablet by mouth 2 (two) times daily.    [provider]   butalbital-acetaminophen-caffeine (FIORICET, ESGIC) [phone] MG tablet Take 1 tablet by mouth every 6 (six) hours as needed for headache. 03/27/18   Anson Fret, MD  cetirizine (ZYRTEC) 10 MG tablet Take 10 mg by mouth as needed. OTC    [provider]  Cholecalciferol (VITAMIN D3) 3000 units TABS Take by mouth daily.    [provider]  Cobalamin Combinations (B-12) [phone] MCG SUBL B12    [provider]  Fremanezumab-vfrm (AJOVY) 225 MG/1.5ML SOAJ Inject 225 mg into the skin every 30 (thirty) days. 04/19/22   Anson Fret, MD  ibuprofen (ADVIL) 800 MG tablet Take 1 tablet (800 mg total) by mouth every 8 (eight) hours as needed. 07/11/19   Maxie Better, MD  Magnesium 100 MG CAPS magnesium    [provider]  MELATONIN GUMMIES PO Take 12 mg by mouth at bedtime. Takes 2 gummies at night    [provider]  methylcellulose (CITRUCEL) oral powder Take as directed daily 08/27/21   Armbruster, Willaim Rayas, MD  omeprazole (PRILOSEC) 20 MG capsule Take 1 capsule (20 mg total) by mouth daily. 08/27/21   Armbruster, Willaim Rayas, MD  ondansetron (ZOFRAN-ODT) 4 MG disintegrating tablet Take 1-2 tablets (4-8 mg total) by mouth every 8 (eight) hours as needed. 04/19/22   Anson Fret, MD  Pediatric Multivitamins-[facility] COMPLETE) 10 MG CHEW Chew by  mouth as needed.    [provider]  Rimegepant Sulfate (NURTEC) 75 MG TBDP Take 75 mg by mouth daily as needed. For migraines. Take as close to onset of migraine as possible. One daily maximum. 04/19/22   Anson Fret, MD  Turmeric (QC TUMERIC COMPLEX PO) Take by mouth.    [provider]  UNABLE TO FIND Med Name: super green gummi by [facility]    [provider]  UNABLE TO FIND daily. Med Name: Menopautonic : hormonal transition Patient not taking: Reported on 06/01/2023    [provider]  UNABLE TO FIND daily. Med Name: digest gold    [provider]  Vitamin  D, Ergocalciferol, (DRISDOL) 1.25 MG (50000 UNIT) CAPS capsule Take 1 capsule (50,000 Units total) by mouth every 7 (seven) days. 04/21/23   Deeann Saint, MD      Allergies    Patient has no known allergies.    Review of Systems   Review of Systems  Physical Exam Updated Vital Signs BP 120/74   Pulse 82   Temp 98.1 F (36.7 C) (Oral)   Resp 16   LMP 04/20/2021   SpO2 100%  Physical Exam Vitals and nursing note reviewed.  Constitutional:      General: She is not in acute distress.    Appearance: She is well-developed. She is not ill-appearing.  HENT:     Head: Normocephalic and atraumatic.     Right Ear: Tympanic membrane normal.     Left Ear: Tympanic membrane normal.     Nose: Nose normal.     Mouth/Throat:     Mouth: Mucous membranes are moist.  Eyes:     Extraocular Movements: Extraocular movements intact.     Conjunctiva/sclera: Conjunctivae normal.     Pupils: Pupils are equal, round, and reactive to light.  Cardiovascular:     Rate and Rhythm: Normal rate and regular rhythm.     Heart sounds: No murmur heard. Pulmonary:     Effort: Pulmonary effort is normal. No respiratory distress.     Breath sounds: Normal breath sounds.  Abdominal:     Palpations: Abdomen is soft.     Tenderness: There is no abdominal tenderness.  Musculoskeletal:        General: Tenderness present. No swelling.     Cervical back: Neck supple.     Right lower leg: No edema.     Left lower leg: No edema.     Comments: Mildly tender to the left calf, left hamstring  Skin:    General: Skin is warm and dry.     Capillary Refill: Capillary refill takes less than 2 seconds.  Neurological:     General: No focal deficit present.     Mental Status: She is alert and oriented to person, place, and time.     Cranial Nerves: No cranial nerve deficit.     Sensory: No sensory deficit.     Motor: No weakness.     Coordination: Coordination normal.  Psychiatric:        Mood and Affect: Mood  normal.     ED Results / Procedures / Treatments   Labs (all labs ordered are listed, but only abnormal results are displayed) Labs Reviewed  BASIC METABOLIC PANEL - Abnormal; Notable for the following components:      Result Value   Glucose, Bld 122 (*)    Calcium 8.6 (*)    All other components within normal limits  D-DIMER, QUANTITATIVE -  Abnormal; Notable for the following components:   D-Dimer, Quant 0.51 (*)    All other components within normal limits  CBC WITH DIFFERENTIAL/PLATELET  TROPONIN I (HIGH SENSITIVITY)    EKG EKG Interpretation  Date/Time:  Thursday June 08 2023 20:52:42 EDT Ventricular Rate:  92 PR Interval:  126 QRS Duration: 73 QT Interval:  348 QTC Calculation: 431 R Axis:   64 Text Interpretation: Sinus rhythm Confirmed by Virgina Norfolk ([id number]) on 06/08/2023 8:58:54 PM  Radiology CT Angio Chest PE W and/or Wo Contrast  Result Date: 06/08/2023 CLINICAL DATA:  Left leg pain and swelling with positive D-dimer and chest pain, initial encounter EXAM: CT ANGIOGRAPHY CHEST WITH CONTRAST TECHNIQUE: Multidetector CT imaging of the chest was performed using the standard protocol during bolus administration of intravenous contrast. Multiplanar CT image reconstructions and MIPs were obtained to evaluate the vascular anatomy. RADIATION DOSE REDUCTION: This exam was performed according to the departmental dose-optimization program which includes automated exposure control, adjustment of the mA and/or kV according to patient size and/or use of iterative reconstruction technique. CONTRAST:  75mL OMNIPAQUE IOHEXOL 350 MG/ML SOLN COMPARISON:  None Available. FINDINGS: Cardiovascular: Thoracic aorta shows no aneurysmal dilatation or dissection. No cardiac enlargement is noted. Pulmonary artery is within normal limits. No filling defect to suggest pulmonary embolism is noted. Mediastinum/Nodes: Esophagus is within normal limits. No hilar or mediastinal adenopathy is noted. The  thoracic inlet is unremarkable. Lungs/Pleura: Lungs are well aerated bilaterally. No focal infiltrate or effusion is seen. Upper Abdomen: Visualized upper abdomen is within normal limits. Musculoskeletal: No chest wall abnormality. No acute or significant osseous findings. Review of the MIP images confirms the above findings. IMPRESSION: No evidence of pulmonary embolism. No acute abnormality seen. Electronically Signed   By: Alcide Clever M.D.   On: 06/08/2023 22:22   DG Chest Portable 1 View  Result Date: 06/08/2023 CLINICAL DATA:  Chest pain and palpitations. EXAM: PORTABLE CHEST 1 VIEW COMPARISON:  None Available. FINDINGS: Lung volumes are low.The cardiomediastinal contours are normal. The lungs are clear. Pulmonary vasculature is normal. No consolidation, pleural effusion, or pneumothorax. No acute osseous abnormalities are seen. IMPRESSION: Low lung volumes without acute chest finding. Electronically Signed   By: Narda Rutherford M.D.   On: 06/08/2023 21:25    Procedures Procedures    Medications Ordered in ED Medications  sodium chloride 0.9 % bolus 1,000 mL (0 mLs Intravenous Stopped 06/08/23 2246)  iohexol (OMNIPAQUE) 350 MG/ML injection 100 mL (75 mLs Intravenous Contrast Given 06/08/23 2157)    ED Course/ Medical Decision Making/ A&P                             Medical Decision Making Amount and/or Complexity of Data Reviewed Labs: ordered. Radiology: ordered.  Risk Prescription drug management.   Bhargavi Wallock [place] is here for palpitations and leg pain/leg swelling on the left.  Normal vitals.  No fever.  No major significant history except for migraines and anxiety.  Differential diagnosis likely PACs or PVCs/palpitations versus less likely ACS PE or electrolyte abnormality.  She is little bit tender behind her left calf and hamstring and knee area but do not see any major asymmetric swelling on exam.  Will have her come back tomorrow for DVT study if we can discharge her  tonight.  I have no concern for stroke or other neurovascular process.  She has good pulses on exam.  No signs of infection in her legs.  As far as her palpitations will get troponin, D-dimer, chest x-ray and basic labs and reevaluate.  Will give fluid bolus.  Could be anxiety related.  No significant and neurological abnormality or kidney injury or leukocytosis per my review and interpretation.  EKG shows sinus rhythm.  No ischemic changes.  Troponin normal.  Doubt ACS.  D-dimer mildly elevated and CT scan of the chest was obtained that showed no evidence of pneumonia pneumothorax or PE.  Overall do think that this is a stress reaction.  Will have her come back tomorrow to get DVT study to further rule out blood clot in the left lower leg.  Given reassurance.  Recommend follow-up with primary care doctor.  Discharged in good condition.  This chart was dictated using voice recognition software.  Despite best efforts to proofread,  errors can occur which can change the documentation meaning.         Final Clinical Impression(s) / ED Diagnoses Final diagnoses:  Weakness  Leg swelling  Palpitations    Rx / DC Orders ED Discharge Orders          Ordered    US Venous Img Lower Unilateral Left        06/08/23 2056              Virgina Norfolk, DO 06/08/23 2258

## 2023-06-08 NOTE — Discharge Instructions (Signed)
Return for ultrasound tomorrow.  Continue outpatient workup for palpitations your cardiologist.

## 2023-06-08 NOTE — ED Notes (Signed)
Patient transported to CT 

## 2023-06-09 ENCOUNTER — Ambulatory Visit (HOSPITAL_BASED_OUTPATIENT_CLINIC_OR_DEPARTMENT_OTHER)
Admission: RE | Admit: 2023-06-09 | Discharge: 2023-06-09 | Disposition: A | Discharge status: Home / Self Care | Payer: Managed Care, Other (non HMO) | Source: Ambulatory Visit | Attending: Family Medicine | Admitting: Family Medicine

## 2023-06-09 DIAGNOSIS — M79605 Pain in left leg: Secondary | ICD-10-CM | POA: Insufficient documentation

## 2023-06-23 ENCOUNTER — Telehealth: Payer: Self-pay | Admitting: *Deleted

## 2023-06-23 ENCOUNTER — Ambulatory Visit (INDEPENDENT_AMBULATORY_CARE_PROVIDER_SITE_OTHER): Payer: Managed Care, Other (non HMO) | Admitting: Family Medicine

## 2023-06-23 VITALS — BP 106/74 | HR 93 | Temp 98.1°F | Wt 150.8 lb

## 2023-06-23 DIAGNOSIS — R002 Palpitations: Secondary | ICD-10-CM | POA: Diagnosis not present

## 2023-06-23 DIAGNOSIS — R198 Other specified symptoms and signs involving the digestive system and abdomen: Secondary | ICD-10-CM | POA: Diagnosis not present

## 2023-06-23 DIAGNOSIS — G4700 Insomnia, unspecified: Secondary | ICD-10-CM | POA: Diagnosis not present

## 2023-06-23 DIAGNOSIS — K5909 Other constipation: Secondary | ICD-10-CM | POA: Diagnosis not present

## 2023-06-23 DIAGNOSIS — F411 Generalized anxiety disorder: Secondary | ICD-10-CM

## 2023-06-23 DIAGNOSIS — F439 Reaction to severe stress, unspecified: Secondary | ICD-10-CM

## 2023-06-23 DIAGNOSIS — K589 Irritable bowel syndrome without diarrhea: Secondary | ICD-10-CM

## 2023-06-23 DIAGNOSIS — R7303 Prediabetes: Secondary | ICD-10-CM

## 2023-06-23 NOTE — Telephone Encounter (Signed)
Patient had not received ZIO XT monitor ordered 06/01/23. Checked [employer].  Monitor was shipped to [address], [place], [place]  [address] and returned to [place] unused. Patient states that is her old address.  She provided new address at her 06/01/23 appointment, which must not have been update prior to sending monitor request to [facility]  via EMR integration . [employer] contacted to ship a replacement monitor to her new address.

## 2023-06-23 NOTE — Patient Instructions (Signed)
Referral to allergist was placed.  They will call you about setting up this appointment.  I have included some information about endometriosis.  As previously discussed diagnosis is based on actually seeing the tissues via surgery.  But it may be something to mention to your OB/GYN.

## 2023-06-23 NOTE — Progress Notes (Signed)
Established Patient Office Visit   Subjective  Patient ID: Whitney Kent, female    DOB: [birth year]  Age: 52 y.o. MRN: [id number]  Chief Complaint  Patient presents with   Medical Management of Chronic Issues    From UC for swelling in legs and feet and chest. [place] of leg did not show any blood clots Does not see GI PA until sept, want to know any recommendations for the bowel pain. It is the most concerning, having pain when having to go to rr. Brigance scale is not really helping  Possible referral to allergist to see if she has food allergy. Did see cardio, and will be having sleep study. Post COVID cough. Pre diabetic results     Patient is a 52 year old female seen for follow-up on ongoing concerns.  Patient endorses continued bowel pain.  Notes symptoms since was younger..  Pain feels like it is inside of bowels.  Pain better with BM.  Patient notes incomplete bowel emptying, bloating, abdominal distention, flatus.  Discomfort makes it difficult to sleep.  Patient endorses going between constipation and diarrhea but more constipation.  Passes small hard pieces of stools.  Taking align probiotic and MiraLAX.  Also notes history of low back pain, painful menses, heavy menses, pain with sex.  Patient has a history of stable fibroids not currently requiring intervention.  Has appointment with GI in September.  Patient saw cardiology for palpitations.  Awaiting results of Holter monitor.  Advised to reduce caffeine intake.  Pt concerned about prediabetes noted during CPE.  Has cut down on sugar intake and is eating more plant-based diet.  Inquires about having allergy testing done due to continued bowel issues despite diet changes.  Patient seen in ED 06/08/2023 for CP, LLE edema, and palpitations.  D-dimer was barely elevated so ultrasound of legs done which was negative.  Other labs and imaging also negative.  Symptoms thought possibly to anxiety.  LLE edema has improved.  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  Difficulty staying asleep 2/2 abdominal and back pain.  Melatonin was not helpful.  Patient hesitant to take other over-the-counter supplements.    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   Past Surgical History:  Procedure Laterality Date   COLONOSCOPY  2018   DILATATION & CURETTAGE/HYSTEROSCOPY WITH MYOSURE N/A 07/11/2019   Procedure: DILATATION & CURETTAGE/HYSTEROSCOPY WITH MYOSURE;  Surgeon: Maxie Better, MD;  Location: [facility];  Service: Gynecology;  Laterality: N/A;   DILATATION & CURETTAGE/HYSTEROSCOPY WITH MYOSURE N/A 04/09/2021   Procedure: DILATATION & CURETTAGE/HYSTEROSCOPY WITH MYOSURE;  Surgeon: Maxie Better, MD;  Location: [facility];  Service: Gynecology;  Laterality: N/A;   LAPAROSCOPIC TUBAL LIGATION Bilateral 11/27/2020   Procedure: LAPAROSCOPIC TUBAL LIGATION with BIPOLAR CAUTERY;  Surgeon: Maxie Better, MD;  Location: [facility];  Service: Gynecology;  Laterality: Bilateral;   WISDOM TOOTH EXTRACTION     2009 x4 removed   Social History   Tobacco Use   Smoking status: Never   Smokeless tobacco: Never  Vaping Use   Vaping Use: Never used  Substance Use Topics  Alcohol use: Yes    Comment: ocas   Drug use: No   Family History  Problem Relation Age of Onset   Uterine cancer Mother    Alcohol abuse Mother    Arthritis Mother    Depression Mother    Drug abuse Mother    Heart disease Mother    Mental illness Mother    Stroke Mother    Kidney failure Father    Alcohol  abuse Father    Diabetes Father    Prostate cancer Father    Colon cancer Father 43   Kidney disease Father    Migraines Father    Depression Brother    Migraines Brother    Colon cancer Maternal Aunt    Arthritis Maternal Grandmother    COPD Maternal Grandmother    Heart attack Maternal Grandmother    Hypertension Maternal Grandmother    Hyperlipidemia Maternal Grandmother    Arthritis Maternal Grandfather    Heart attack Maternal Grandfather    Hyperlipidemia Maternal Grandfather    Diabetes Paternal Grandfather    Depression Daughter    Asthma Daughter    Depression Daughter    Autism Daughter    Esophageal cancer Neg Hx    Pancreatic cancer Neg Hx    Rectal cancer Neg Hx    Stomach cancer Neg Hx    Thyroid disease Neg Hx    No Known Allergies    ROS Negative unless stated above    Objective:     BP 106/74 (BP Location: Left Arm, Patient Position: Sitting, Cuff Size: Normal)   Pulse 93   Temp 98.1 F (36.7 C) (Oral)   Wt 150 lb 12.8 oz (68.4 kg)   LMP 04/20/2021   SpO2 96%   BMI 27.14 kg/m  BP Readings from Last 3 Encounters:  06/23/23 106/74  06/08/23 120/74  06/01/23 93/69   Wt Readings from Last 3 Encounters:  06/23/23 150 lb 12.8 oz (68.4 kg)  06/01/23 146 lb 3.2 oz (66.3 kg)  04/20/23 149 lb 9.6 oz (67.9 kg)      Physical Exam Constitutional:      General: She is not in acute distress.    Appearance: Normal appearance.  HENT:     Head: Normocephalic and atraumatic.     Nose: Nose normal.     Mouth/Throat:     Mouth: Mucous membranes are moist.  Cardiovascular:     Rate and Rhythm: Normal rate and regular rhythm.     Heart sounds: Normal heart sounds. No murmur heard.    No gallop.  Pulmonary:     Effort: Pulmonary effort is normal. No respiratory distress.     Breath sounds: Normal breath sounds. No wheezing, rhonchi or rales.  Abdominal:     General: There is distension.  Musculoskeletal:     Comments: Mild edema of bilateral  anklesL>R.  Skin:    General: Skin is warm and dry.  Neurological:     Mental Status: She is alert and oriented to person, place, and time.       06/23/2023    2:59 PM 04/20/2023   10:04 AM 07/20/2022    9:17 AM  Depression screen PHQ 2/9  Decreased Interest 1 0 0  Down, Depressed, Hopeless 1 0 0  PHQ - 2 Score 2 0 0  Altered sleeping 3  3  Tired, decreased energy 2  1  Change in appetite 2  0  Feeling bad or failure about yourself  1  0  Trouble concentrating 2  1  Moving slowly or fidgety/restless 1  0  Suicidal thoughts 0  0  PHQ-9 Score 13  5  Difficult doing work/chores Somewhat difficult  Somewhat difficult      06/23/2023    2:59 PM 12/27/2021    4:42 PM 07/05/2021    3:48 PM 04/15/2021    9:41 AM  GAD 7 : Generalized Anxiety Score  Nervous, Anxious, on Edge 1 2 0 0  Control/stop worrying 1 1 1  0  Worry too much - different things 1 2 1 1   Trouble relaxing 2 1 1 1   Restless 1 1 1 1   Easily annoyed or irritable 2 1 1 1   Afraid - awful might happen 1 1 2  0  Total GAD 7 Score 9 9 7 4   Anxiety Difficulty Somewhat difficult Somewhat difficult Somewhat difficult Somewhat difficult      No results found for any visits on 06/23/23.    Assessment & Plan:  Pain with bowel movements  Insomnia, unspecified type -Sleep hygiene  Palpitations  Chronic constipation - -     Ambulatory referral to Allergy  Prediabetes -Hemoglobin A1c 5.9% on 04/20/2023  Irritable bowel syndrome, unspecified type -     Ambulatory referral to Allergy  GAD (generalized anxiety disorder) -GAD-7 score 9 -PHQ-9 score 13  Stress  Acute on chronic issues.  Patient advised to keep appointment with GI for chronic bowel issues.  Can contact office to see if appointment can be made sooner.  Symptoms likely 2/2 IBS worsened by stress/anxiety.  Okay to take MiraLAX daily as needed.  Patient requesting referral to allergy for allergy testing.  Discussed lifestyle modifications including diet and  exercise for pre-DM.  Hemoglobin A1c 5.9% on 04/20/2023.  Self-care encouraged.  Discussed medications such as SSRIs to aid with anxiety symptoms.  Patient to decide.  Continue follow-up with cardiology as needed for palpitations.   On day of service, 33 minutes spent caring for this patient face-to-face, reviewing the chart, counseling and/or coordinating care for plan and treatment of diagnosis below.     Return if symptoms worsen or fail to improve.   Deeann Saint, MD

## 2023-06-30 DIAGNOSIS — R002 Palpitations: Secondary | ICD-10-CM

## 2023-07-14 ENCOUNTER — Encounter: Payer: Self-pay | Admitting: Internal Medicine

## 2023-08-25 ENCOUNTER — Ambulatory Visit (INDEPENDENT_AMBULATORY_CARE_PROVIDER_SITE_OTHER): Payer: Managed Care, Other (non HMO) | Admitting: Nurse Practitioner

## 2023-08-25 ENCOUNTER — Telehealth: Payer: Self-pay

## 2023-08-25 ENCOUNTER — Encounter: Payer: Self-pay | Admitting: Nurse Practitioner

## 2023-08-25 VITALS — BP 108/74 | HR 96 | Ht 62.25 in | Wt 147.2 lb

## 2023-08-25 DIAGNOSIS — K5900 Constipation, unspecified: Secondary | ICD-10-CM | POA: Diagnosis not present

## 2023-08-25 DIAGNOSIS — R194 Change in bowel habit: Secondary | ICD-10-CM

## 2023-08-25 MED ORDER — LINACLOTIDE 145 MCG PO CAPS: 145.0000 ug | ORAL_CAPSULE | Freq: Every day | ORAL | 3 refills | Status: DC

## 2023-08-25 NOTE — Patient Instructions (Addendum)
Stop Miralax stool softener and Dulcolax.  Your provider recommends that you complete a bowel purge (to clean out your bowels).   You will need to purchase the following over the counter for the Miralax purge this weekend: 238 gram bottle of Miralax (polyethylene glycol)  Dulcolax 5 mg tablets (four tablets) Optional: 32 ounce bottle of Gatorade (Gatorade Zero for diabetic patients)  Mix 6-8 capfuls of Miralax with 32 ounces of desired liquid (water/juice/Gatorade) and place in the refrigerator. Take 4 dulcolax tablets with water. Wait 1 hour. You should then drink 8 ounces of Miralax/liquid solution every 15 minutes over the next 1-3 hours until the solution is gone. You should expect results within 1 to 6 hours after completing the bowel purge.   We have sent the following medications to your pharmacy for you to pick up at your convenience: Linzess 145 mcg  Due to recent changes in healthcare laws, you may see the results of your imaging and laboratory studies on MyChart before your provider has had a chance to review them.  We understand that in some cases there may be results that are confusing or concerning to you. Not all laboratory results come back in the same time frame and the provider may be waiting for multiple results in order to interpret others.  Please give [place] 48 hours in order for your provider to thoroughly review all the results before contacting the office for clarification of your results.   Thank you for trusting me with your gastrointestinal care!

## 2023-08-25 NOTE — Progress Notes (Signed)
Whitney & PLAN   52 y.o.  female known to Dr. Adela Kent, last seen September 2022. She was previously evaluated here for GERD,  chronically altered bowel habits, bloating and a family history of colon cancer.   Constipation with hard, pebble like stools despite miralax, PO Dulcolax and stool softeners. Mild constipation in past, unclear why the progression over last several months. Gummies with iron possibly contributing.  --TSH normal.  --I didn't start the gummies with iron so I am not discontinuing them. Hgb borderline in June 2024 --Miralax bowel purge,  today if possible  --The day following bowel purge she will start Linzess 145 mcg daily on an empty stomach.  --For now she can hold the Dulcolax, stool softeners and MiraLAX -- Follow-up with me in 4 to 6 weeks.  If constipation not improving consider colonoscopy for bowel changes.  Her last one was in 2018.  Low back pain radiating down both buttocks and back of both legs. Etiology unclear. Seen at Urgent Care yesterday, got injection  ( NSAID?) and muscle relaxers. So far, not much improvement. Etiology? She wonders if secondary to constipation though this seems unlikely. No associated bowel / bladder incontinence --Treat constipation and see if back and bilateral leg pain resolves  Generalized lower abdominal discomfort. Abdominal exam is reassuring. Constipation related?  -- Consider abdominal imaging if pain does not improve with treatment of constipation  Family history of colon cancer in Father (but it was in his mid 23's.)   History of ADHD, goiter.   See PMH below for additional history  HPI   Chief complaint : constipation, bloating, lower abdominal pain   Whitney Kent has a history of occasional constipation but it has been much worse since around last November. She describes constipation at hard pebble like stools. She cannot correlate worsening constipation to any dietary changes or medications. Tries to follow  plant based diet.  She takes several supplements including a gummie with iron. Her current bowel regimen consists of 2 stool softeners a day, Dulcolax several times a week and Miralax a few times a week.   Whitney Kent was in the ED 06/08/2023 with leg swelling and palpitations.  No evidence for DVT or PE.  Had seen Cardiology on 6/13 for palpitations and advised to limit caffeine and manage stress but consider propanolol in future.   She was seen at urgent care ([facility]) yesterday with pain in coccyx area radiating down both buttocks into back of both legs. Got an NSAID injection and a muscle relaxer. So far, hasn't had much improvement.  Feels like sciatica. GYN thought pain may be related to endometrioses. She feels her back pain and lower abdominal pain may be constipation related.   Hgb 12.6.  TSH normal   Previous GI Endoscopies / Labs / Imaging   **May not include all endoscopic evaluations   CT abdomen / pelvis - 04/25/2017 - normal   EGD September 2022 for dyspepsia, GERD, early satiety.  -- 1 cm hiatal hernia.  -- Normal esophagus, stomach and duodenum.  Stomach biopsied 1. Surgical [P], cecum, polyp - BENIGN POLYPOID COLORECTAL-TYPE MUCOSA. - THERE IS NO EVIDENCE OF MALIGNANCY. 2. Surgical [P], random sites - BENIGN COLONIC MUCOSA. - NO SIGNIFICANT INFLAMMATION OR OTHER ABNORMALITIES IDENTIFIED.  Screening colonoscopy December 2018 -- Good prep -- Diverticulosis of the cecum the left colon -- A 3 mm polyp was removed from the cecum -- Biopsies taken from the right, left and transverse colon for evaluation of microscopic colitis  Diagnosis 1. Surgical [P], cecum, polyp - BENIGN POLYPOID COLORECTAL-TYPE MUCOSA. - THERE IS NO EVIDENCE OF MALIGNANCY. 2. Surgical [P], random sites - BENIGN COLONIC MUCOSA. - NO SIGNIFICANT INFLAMMATION OR OTHER ABNORMALITIES IDENTIFIED.      Latest Ref Rng & Units 04/20/2023   10:49 AM 04/18/2022    8:52 AM 04/15/2021   10:51 AM  Hepatic  Function  Total Protein 6.0 - 8.3 g/dL 6.9  7.2  6.7   Albumin 3.5 - 5.2 g/dL 4.1  4.3  4.1   AST 0 - 37 U/L 15  16  15    ALT 0 - 35 U/L 10  10  10    Alk Phosphatase 39 - 117 U/L 63  58  55   Total Bilirubin 0.2 - 1.2 mg/dL 0.4  0.5  0.3        Latest Ref Rng & Units 06/08/2023    9:09 PM 04/20/2023   10:49 AM 04/18/2022    8:52 AM  CBC  WBC 4.0 - 10.5 K/uL 7.0  5.7  5.3   Hemoglobin 12.0 - 15.0 g/dL 62.1  30.8  65.7   Hematocrit 36.0 - 46.0 % 36.3  41.4  41.6   Platelets 150 - 400 K/uL 252  254.0  265.0      Past Medical History:  Diagnosis Date   ADHD dx feb 2022   no meds for    Allergy    Anemia    Anxiety    Family history of adverse reaction to anesthesia    daughter gets hiccups after anesthesia   GERD (gastroesophageal reflux disease)    Goiter right neck   dr Lucianne Muss follows lov 01-28-2021   last [place] 04-03-2018   Hemiplegic migraine    pt dx 2004.  Pt had 1 seizure after this dx.  Not sure if related to dx or not. maw   History of asthma    none since age 52   History of depression    Seizures (HCC)    2004. only 1 seizure   Stroke (HCC) 2019   mild no residual deficit found on mri   Syncope    occ with migraine fatigue with aura and migraine   Vitamin D deficiency     Past Surgical History:  Procedure Laterality Date   COLONOSCOPY  2018   DILATATION & CURETTAGE/HYSTEROSCOPY WITH MYOSURE N/A 07/11/2019   Procedure: DILATATION & CURETTAGE/HYSTEROSCOPY WITH MYOSURE;  Surgeon: Maxie Better, MD;  Location: [facility];  Service: Gynecology;  Laterality: N/A;   DILATATION & CURETTAGE/HYSTEROSCOPY WITH MYOSURE N/A 04/09/2021   Procedure: DILATATION & CURETTAGE/HYSTEROSCOPY WITH MYOSURE;  Surgeon: Maxie Better, MD;  Location: [facility];  Service: Gynecology;  Laterality: N/A;   LAPAROSCOPIC TUBAL LIGATION Bilateral 11/27/2020   Procedure: LAPAROSCOPIC TUBAL LIGATION with BIPOLAR CAUTERY;  Surgeon: Maxie Better, MD;   Location: [facility];  Service: Gynecology;  Laterality: Bilateral;   WISDOM TOOTH EXTRACTION     2009 x4 removed    Family History  Problem Relation Age of Onset   Uterine cancer Mother    Alcohol abuse Mother    Arthritis Mother    Depression Mother    Drug abuse Mother    Heart disease Mother    Mental illness Mother    Stroke Mother    Kidney failure Father    Alcohol abuse Father    Diabetes Father    Prostate cancer Father    Colon cancer Father 18  Kidney disease Father    Migraines Father    Depression Brother    Migraines Brother    Colon cancer Maternal Aunt    Arthritis Maternal Grandmother    COPD Maternal Grandmother    Heart attack Maternal Grandmother    Hypertension Maternal Grandmother    Hyperlipidemia Maternal Grandmother    Arthritis Maternal Grandfather    Heart attack Maternal Grandfather    Hyperlipidemia Maternal Grandfather    Diabetes Paternal Grandfather    Depression Daughter    Asthma Daughter    Depression Daughter    Autism Daughter    Esophageal cancer Neg Hx    Pancreatic cancer Neg Hx    Rectal cancer Neg Hx    Stomach cancer Neg Hx    Thyroid disease Neg Hx     Current Medications, Allergies, Family History and Social History were reviewed in [employer] electronic medical record.     Current Outpatient Medications  Medication Sig Dispense Refill   acetaminophen (TYLENOL) 500 MG tablet Take 500 mg by mouth every 6 (six) hours as needed.     Ascorbic Acid (VITAMIN C) 100 MG tablet Take 100 mg by mouth daily.     bisacodyl (DULCOLAX) 5 MG EC tablet Take 10 mg by mouth as needed for moderate constipation.     butalbital-acetaminophen-caffeine (FIORICET, ESGIC) 50-325-40 MG tablet Take 1 tablet by mouth every 6 (six) hours as needed for headache. 20 tablet 3   cetirizine (ZYRTEC) 10 MG tablet Take 10 mg by mouth as needed. OTC     Cholecalciferol (VITAMIN D3) 3000 units TABS Take by mouth daily.     Cobalamin  Combinations (B-12) [phone] MCG SUBL B12     cyclobenzaprine (FLEXERIL) 10 MG tablet Take by mouth.     ibuprofen (ADVIL) 800 MG tablet Take 1 tablet (800 mg total) by mouth every 8 (eight) hours as needed. 30 tablet 5   linaclotide (LINZESS) 145 MCG CAPS capsule Take 1 capsule (145 mcg total) by mouth daily. 30 capsule 3   Magnesium 100 MG CAPS magnesium     MELATONIN GUMMIES PO Take 12 mg by mouth at bedtime. Takes 2 gummies at night     meloxicam (MOBIC) 15 MG tablet Take by mouth.     Pediatric Multivitamins-Iron (FLINTSTONES COMPLETE) 10 MG CHEW Chew by mouth as needed.     Turmeric (QC TUMERIC COMPLEX PO) Take by mouth.     UNABLE TO FIND Med Name: super green gummi by [employer]     albuterol (VENTOLIN HFA) 108 (90 Base) MCG/ACT inhaler Inhale 2 puffs into the lungs every 6 (six) hours as needed for wheezing or shortness of breath. (Patient not taking: Reported on 07/20/2022) 8 g 0   albuterol (VENTOLIN HFA) 108 (90 Base) MCG/ACT inhaler Inhale 2 puffs into the lungs every 6 (six) hours as needed for wheezing or shortness of breath. (Patient not taking: Reported on 07/20/2022) 6.7 g 0   aspirin EC 81 MG tablet Take 81 mg by mouth daily. (Patient not taking: Reported on 08/25/2023)     atomoxetine (STRATTERA) 18 MG capsule Take 1 capsule (18 mg total) by mouth daily. (Patient not taking: Reported on 08/25/2023) 30 capsule 0   buPROPion (WELLBUTRIN) 100 MG tablet Take 1 tablet by mouth 2 (two) times daily. (Patient not taking: Reported on 08/25/2023)     Fremanezumab-vfrm (AJOVY) 225 MG/1.5ML SOAJ Inject 225 mg into the skin every 30 (thirty) days. (Patient not taking: Reported on 08/25/2023) 1.5 mL 11  omeprazole (PRILOSEC) 20 MG capsule Take 1 capsule (20 mg total) by mouth daily. (Patient not taking: Reported on 08/25/2023) 90 capsule 1   ondansetron (ZOFRAN-ODT) 4 MG disintegrating tablet Take 1-2 tablets (4-8 mg total) by mouth every 8 (eight) hours as needed. (Patient not taking: Reported on 08/25/2023) 30  tablet 3   Rimegepant Sulfate (NURTEC) 75 MG TBDP Take 75 mg by mouth daily as needed. For migraines. Take as close to onset of migraine as possible. One daily maximum. (Patient not taking: Reported on 08/25/2023) 8 tablet 0   UNABLE TO FIND daily. Med Name: Menopautonic : hormonal transition (Patient not taking: Reported on 06/01/2023)     UNABLE TO FIND daily. Med Name: digest gold (Patient not taking: Reported on 08/25/2023)     Vitamin D, Ergocalciferol, (DRISDOL) 1.25 MG (50000 UNIT) CAPS capsule Take 1 capsule (50,000 Units total) by mouth every 7 (seven) days. (Patient not taking: Reported on 08/25/2023) 12 capsule 0   No current facility-administered medications for this visit.    Review of Systems: No chest pain. No shortness of breath. No urinary complaints.    Physical Exam  Wt Readings from Last 3 Encounters:  06/23/23 150 lb 12.8 oz (68.4 kg)  06/01/23 146 lb 3.2 oz (66.3 kg)  04/20/23 149 lb 9.6 oz (67.9 kg)    LMP 04/20/2021  Constitutional:  Pleasant, generally well appearing female in no acute distress. Psychiatric: Normal mood and affect. Behavior is normal. EENT: Pupils normal.  Conjunctivae are normal. No scleral icterus. Neck supple.  Cardiovascular: Normal rate, regular rhythm.  Pulmonary/chest: Effort normal and breath sounds normal. No wheezing, rales or rhonchi. Abdominal: Soft, nondistended, nontender. Bowel sounds active throughout. There are no masses palpable. No hepatomegaly. Neurological: Alert and oriented to person place and time.    Willette Cluster, NP  08/25/2023, 8:17 AM         .

## 2023-08-25 NOTE — Progress Notes (Signed)
Agree with assessment and plan as outlined.  

## 2023-08-25 NOTE — Telephone Encounter (Signed)
Contacted pt & LVM to return call 

## 2023-08-29 ENCOUNTER — Encounter (HOSPITAL_BASED_OUTPATIENT_CLINIC_OR_DEPARTMENT_OTHER): Payer: Self-pay | Admitting: Emergency Medicine

## 2023-08-29 ENCOUNTER — Emergency Department (HOSPITAL_BASED_OUTPATIENT_CLINIC_OR_DEPARTMENT_OTHER)
Admission: EM | Admit: 2023-08-29 | Discharge: 2023-08-29 | Disposition: A | Discharge status: Home / Self Care | Payer: Managed Care, Other (non HMO) | Attending: Emergency Medicine | Admitting: Emergency Medicine

## 2023-08-29 ENCOUNTER — Other Ambulatory Visit (HOSPITAL_BASED_OUTPATIENT_CLINIC_OR_DEPARTMENT_OTHER): Payer: Self-pay

## 2023-08-29 ENCOUNTER — Other Ambulatory Visit: Payer: Self-pay

## 2023-08-29 DIAGNOSIS — M79604 Pain in right leg: Secondary | ICD-10-CM | POA: Insufficient documentation

## 2023-08-29 DIAGNOSIS — M545 Low back pain, unspecified: Secondary | ICD-10-CM | POA: Insufficient documentation

## 2023-08-29 DIAGNOSIS — M79605 Pain in left leg: Secondary | ICD-10-CM | POA: Diagnosis not present

## 2023-08-29 MED ORDER — KETOROLAC TROMETHAMINE 30 MG/ML IJ SOLN
30.0000 mg | Freq: Once | INTRAMUSCULAR | Status: AC
  Administered 2023-08-29: 30 mg via INTRAMUSCULAR
  Filled 2023-08-29: qty 1

## 2023-08-29 NOTE — ED Triage Notes (Signed)
Pt arrives to ED with c/o on-going back pain.

## 2023-08-29 NOTE — Telephone Encounter (Signed)
Patient at [facility] ED due to back pain this am. Patient was seen in the office on 08/25/23.

## 2023-08-29 NOTE — ED Provider Notes (Signed)
[place] EMERGENCY DEPARTMENT AT [facility] Provider Note   CSN: [id number] Arrival date & time: 08/29/23  2440     History  Chief Complaint  Patient presents with   Back Pain    Whitney Kent is a 52 y.o. female.   Back Pain  Patient is a 52 year old female with a past medical history significant for thyroid goiter, anxiety, seizures, migraines, depression, stroke without residual deficit, reflux, anemia  She presents emergency room today with complaints of low back pain and crampy pain in bilateral hamstrings.  She denies any trauma falls or pain after bending over.  She states her symptoms been going on since approximately 1 week ago although she states the back of her legs only started hurting a few days ago.  She denies any numbness or weakness.  She was given methocarbamol for her symptoms which she has taken but has not had significant improvement.  She has taken no anti-inflammatories or ibuprofen.  No fevers or chills.  No history of IV drug use, no trauma, no chronic steroid use or anticoagulants.     Home Medications Prior to Admission medications   Medication Sig Start Date End Date Taking? Authorizing Provider  acetaminophen (TYLENOL) 500 MG tablet Take 500 mg by mouth every 6 (six) hours as needed.    [provider]  albuterol (VENTOLIN HFA) 108 (90 Base) MCG/ACT inhaler Inhale 2 puffs into the lungs every 6 (six) hours as needed for wheezing or shortness of breath. Patient not taking: Reported on 07/20/2022 03/24/22   Deeann Saint, MD  albuterol (VENTOLIN HFA) 108 (90 Base) MCG/ACT inhaler Inhale 2 puffs into the lungs every 6 (six) hours as needed for wheezing or shortness of breath. Patient not taking: Reported on 07/20/2022 04/18/22   Deeann Saint, MD  Ascorbic Acid (VITAMIN C) 100 MG tablet Take 100 mg by mouth daily.    [provider]  atomoxetine (STRATTERA) 18 MG capsule Take 1 capsule (18 mg total) by mouth  daily. Patient not taking: Reported on 08/25/2023 07/20/22   Deeann Saint, MD  bisacodyl (DULCOLAX) 5 MG EC tablet Take 10 mg by mouth as needed for moderate constipation.    [provider]  butalbital-acetaminophen-caffeine (FIORICET, ESGIC) [phone] MG tablet Take 1 tablet by mouth every 6 (six) hours as needed for headache. 03/27/18   Anson Fret, MD  cetirizine (ZYRTEC) 10 MG tablet Take 10 mg by mouth as needed. OTC    [provider]  Cholecalciferol (VITAMIN D3) 3000 units TABS Take by mouth daily.    [provider]  Cobalamin Combinations (B-12) [phone] MCG SUBL B12    [provider]  cyclobenzaprine (FLEXERIL) 10 MG tablet Take by mouth. 08/24/23 08/29/23  [provider]  Fremanezumab-vfrm (AJOVY) 225 MG/1.5ML SOAJ Inject 225 mg into the skin every 30 (thirty) days. Patient not taking: Reported on 08/25/2023 04/19/22   Anson Fret, MD  ibuprofen (ADVIL) 800 MG tablet Take 1 tablet (800 mg total) by mouth every 8 (eight) hours as needed. 07/11/19   Maxie Better, MD  linaclotide Karlene Einstein) 145 MCG CAPS capsule Take 1 capsule (145 mcg total) by mouth daily. 08/25/23 08/19/24  Meredith Pel, NP  Magnesium 100 MG CAPS magnesium    [provider]  MELATONIN GUMMIES PO Take 12 mg by mouth at bedtime. Takes 2 gummies at night    [provider]  meloxicam (MOBIC) 15 MG tablet Take by mouth. 08/24/23 09/07/23  [provider]  omeprazole (PRILOSEC) 20 MG capsule Take 1 capsule (20 mg total) by mouth daily. Patient not taking: Reported on 08/25/2023 08/27/21   Benancio Deeds, MD  ondansetron (ZOFRAN-ODT) 4 MG disintegrating tablet Take 1-2 tablets (4-8 mg total) by mouth every 8 (eight) hours as needed. Patient not taking: Reported on 08/25/2023 04/19/22   Anson Fret, MD  Pediatric Multivitamins-Iron [facility] COMPLETE) 10 MG CHEW Chew by mouth as needed.    [provider]  Rimegepant Sulfate  (NURTEC) 75 MG TBDP Take 75 mg by mouth daily as needed. For migraines. Take as close to onset of migraine as possible. One daily maximum. Patient not taking: Reported on 08/25/2023 04/19/22   Anson Fret, MD  Turmeric (QC TUMERIC COMPLEX PO) Take by mouth.    [provider]  UNABLE TO FIND Med Name: super green gummi by [facility]    [provider]  Vitamin D, Ergocalciferol, (DRISDOL) 1.25 MG (50000 UNIT) CAPS capsule Take 1 capsule (50,000 Units total) by mouth every 7 (seven) days. Patient not taking: Reported on 08/25/2023 04/21/23   Deeann Saint, MD      Allergies    Levonorgestrel-ethinyl estrad    Review of Systems   Review of Systems  Musculoskeletal:  Positive for back pain.    Physical Exam Updated Vital Signs BP (!) 115/98 (BP Location: Right Arm)   Pulse 82   Temp (!) 97.2 F (36.2 C)   Resp 19   LMP 04/20/2021   SpO2 100%  Physical Exam Vitals and nursing note reviewed.  Constitutional:      General: She is not in acute distress. HENT:     Head: Normocephalic and atraumatic.     Nose: Nose normal.     Mouth/Throat:     Mouth: Mucous membranes are moist.  Eyes:     General: No scleral icterus. Cardiovascular:     Rate and Rhythm: Normal rate and regular rhythm.     Pulses: Normal pulses.     Heart sounds: Normal heart sounds.  Pulmonary:     Effort: Pulmonary effort is normal. No respiratory distress.     Breath sounds: No wheezing.  Abdominal:     Palpations: Abdomen is soft.     Tenderness: There is no abdominal tenderness. There is no guarding or rebound.  Musculoskeletal:     Cervical back: Normal range of motion.     Right lower leg: No edema.     Left lower leg: No edema.     Comments: Full range of motion of lower extremities.  Notably some discomfort with flexion at the knee against resistance and she localizes this pain to the back of the hamstring bilaterally.  DP PT pulses 3+ and symmetric, sensation normal in bilateral lower  extremities.   Skin:    General: Skin is warm and dry.     Capillary Refill: Capillary refill takes less than 2 seconds.  Neurological:     Mental Status: She is alert. Mental status is at baseline.  Psychiatric:        Mood and Affect: Mood normal.        Behavior: Behavior normal.     ED Results / Procedures / Treatments   Labs (all labs ordered are listed, but only abnormal results are displayed) Labs Reviewed - No data to display  EKG None  Radiology No results found.  Procedures Procedures    Medications Ordered in ED Medications  ketorolac (TORADOL) 30 MG/ML  injection 30 mg (30 mg Intramuscular Given 08/29/23 1051)    ED Course/ Medical Decision Making/ A&P                                 Medical Decision Making  Patient is a 52 year old female with a past medical history significant for thyroid goiter, anxiety, seizures, migraines, depression, stroke without residual deficit, reflux, anemia  She presents emergency room today with complaints of low back pain and crampy pain in bilateral hamstrings.  She denies any trauma falls or pain after bending over.  She states her symptoms been going on since approximately 1 week ago although she states the back of her legs only started hurting a few days ago.  She denies any numbness or weakness.  She was given methocarbamol for her symptoms which she has taken but has not had significant improvement.  She has taken no anti-inflammatories or ibuprofen.  No fevers or chills.  No history of IV drug use, no trauma, no chronic steroid use or anticoagulants.  Broad differential for back pain considered includes malignancy, disc herniation, spinal epidural abscess, spinal fracture, cauda equina, pyelonephritis, kidney stone, AAA, AD, pancreatitis, PE and PTX.   History without symptoms of urinary or stool retention or incontinence, neurologic changes such as sensation change or weakness lower extremities, coagulopathy or blood  thinner use, is not elderly or with history of osteoporosis, denies any history of cancer, fever, IV drug use, weight changes (unexplained), or prolonged steroid use.   Physical exam most consistent with muscular strain. Doubt cauda equina or disc herniation d/t lack of saddle anesthesia/bowel or bladder incontinence or urinary retention, normal gait and reassuring physical examination without neurologic deficits.   History is not supportive of kidney stone, AAA, AD, pancreatitis, PE or PTX. Patient has no CVA tenderness or urinary sx to suggest pyelonephritis or kidney stone.   Will manage patient conservatively at this time. NSAIDs, back exercises/stretches, heat therapy and follow up with PCP if symptoms do not resolve in 3-4 weeks. Patient offered muscle relaxer for comfort at night. Counseled on need to return to ED for fever, worsening or concerning symptoms. Patient agreeable to plan and states understanding of follow up plans and return precautions.      Vitals WNL at time of discharge and patient is no acute distress   Offered Toradol shot 30 IM ketorolac   She will follow-up with her primary care provider.  She is scheduled for an appointment tomorrow I recommended that she wait 1 week and use ibuprofen warm compresses do some gentle massage before she follows up with primary care.  Patient agrees to plan.  Will discharge home at this time.  Normal vital signs well-appearing on exam.   Final Clinical Impression(s) / ED Diagnoses Final diagnoses:  Acute midline low back pain, unspecified whether sciatica present  Pain in both lower extremities    Rx / DC Orders ED Discharge Orders     None         Gailen Shelter, [place] 08/29/23 1106    Gwyneth Sprout, MD 08/30/23 1335

## 2023-08-29 NOTE — Discharge Instructions (Addendum)
You were given a shot of an anti-inflammatory  Take ibuprofen 600-800 mg every 6 hours and use warm compresses on your low back.  Follow up with primary care in 1 week - return to ER for new / concerning symptoms.   As we discussed there are different things that can cause these types of symptoms however I think it is reasonable to start with anti-inflammatories and treat this as muscle pain/inflammation and see if your symptoms improve.     Your examination today is most concerning for a muscular injury 1. Medications: alternate ibuprofen and tylenol for pain control, take all usual home medications as they are prescribed 2. Treatment: rest, ice, elevate and use an ACE wrap or other compressive therapy to decrease swelling. Also drink plenty of fluids and do plenty of gentle stretching and move the affected muscle through its normal range of motion to prevent stiffness. 3. Follow Up: If your symptoms do not improve please follow up with orthopedics/sports medicine or your PCP for discussion of your diagnoses and further evaluation after today's visit; if you do not have a primary care doctor use the resource guide provided to find one; Please return to the ER for worsening symptoms or other concerns.

## 2023-08-30 ENCOUNTER — Ambulatory Visit: Payer: Managed Care, Other (non HMO) | Admitting: Family Medicine

## 2023-08-31 ENCOUNTER — Encounter: Payer: Self-pay | Admitting: Family Medicine

## 2023-08-31 NOTE — Telephone Encounter (Signed)
Called patient to see if any noted changes since her ER visit. Patient states she had gone to the ER due to back pain. Patient states she is still having pain due to sciatica, she has not received the Linzess from the pharmacy as of yet. Nurse encouraged her to get the Linzess as soon as possible, it should help with constipation. Patient states she will have to have someone else go to the pharmacy to get the medication. Patient also states she will like to have a scan done of her ABD prior to having the colonoscopy to see if any noted stool. She states the Miralax purge worked, not as well as expected, patient states she still felt like the lower ABD was full of stool. Patient stressed several times her interest in having the ABD scan. Informed the patient, Ms. Whitney Kent will be notified. Patient understood and agreed.

## 2023-09-01 NOTE — Progress Notes (Unsigned)
NEW PATIENT Date of Service/Encounter:  09/04/23 Referring provider: Deeann Saint, MD Primary care provider: Deeann Saint, MD  Subjective:  Whitney Kent is a 52 y.o. female with a PMHx of hemiplegic migraines, ADHD, vitamin B12 deficiency, goiter presenting today for evaluation of concern for food allergies, environmental allergies, and acute hives. History obtained from: chart review and patient.   She is having a lot of issues with her gut.  Has not seen an allergist in many years. When she was a child she had a lot of positive testing to both foods and environmental testing. She does not remember what her positive testing was to exactly. She is not avoiding anything due to concerns for food allergy-does avoid due to diverticulitis and IBS. She avoids nuts and dairy, drinks soy and oat milk. Did buy goat milk to try. She does get abdominal bloating with cow's milk. She has tried some elimination diets. Beans. Legumes, brussel sprouts and broccoli - she will feel very gassy. No itching or hives or other systemic symptoms. She does not eat many meats any more. It seems like she gets an upset stomach with chicken. She eats steak once per year. She mostly eats seafood.  Green peppers causes her more reflux and heartburn. Particularly when she eats them later in the evening.   She has had hives with medical adhesive when using a heart monitor.  Her environmental allergies still bother her. She rubs her eyes constantly and has her whole life. She has itchy ears and eyes.  She is doing better about not rubbing her whole eye.  She does have PND.  She attributes this to DM and grass or other pollen.  She uses a hepa filter that she keeps on at all times in her home. She takes zyrtec sometimes as well as benadryl PRN. She feels relief with this.  Chart Review:  Reviewed PCP notes from referral 06/23/23: "Chronic constipation: Ambulatory referral to Allergy".  Per HPI  documentation patient having bowel pain improved with BM.  Taking probiotic and MiraLAX.  Intermittent constipation and diarrhea.  Seen GI in September.  Referred to allergy. Evaluated by GI, most recently on 08/25/2023-TSH normal, MiraLAX bowel purge and start Linzess follow-up in 4 to 6 weeks and consider colonoscopy if no improvement.    Other allergy screening: Asthma:  in childhood Medication allergy: no Hymenoptera allergy:  ant bites-localized swelling Urticaria: had some earlier in the year, unsure why Eczema:no History of recurrent infections suggestive of immunodeficency: no Vaccinations are up to date.   Past Medical History: Past Medical History:  Diagnosis Date   ADHD dx feb 2022   no meds for    Allergy    Anemia    Anxiety    Family history of adverse reaction to anesthesia    daughter gets hiccups after anesthesia   GERD (gastroesophageal reflux disease)    Goiter right neck   dr Lucianne Muss follows lov 01-28-2021   last [place] 04-03-2018   Hemiplegic migraine    pt dx 2004.  Pt had 1 seizure after this dx.  Not sure if related to dx or not. maw   History of asthma    none since age 23   History of depression    Seizures (HCC)    2004. only 1 seizure   Stroke (HCC) 2019   mild no residual deficit found on mri   Syncope    occ with migraine fatigue with aura and migraine   Vitamin  D deficiency    Medication List:  Current Outpatient Medications  Medication Sig Dispense Refill   acetaminophen (TYLENOL) 500 MG tablet Take 500 mg by mouth every 6 (six) hours as needed.     Ascorbic Acid (VITAMIN C) 100 MG tablet Take 100 mg by mouth daily.     atomoxetine (STRATTERA) 18 MG capsule Take 1 capsule (18 mg total) by mouth daily. 30 capsule 0   bisacodyl (DULCOLAX) 5 MG EC tablet Take 10 mg by mouth as needed for moderate constipation.     cetirizine (ZYRTEC) 10 MG tablet Take 10 mg by mouth as needed. OTC     Cobalamin Combinations (B-12) [phone] MCG SUBL B12      Fremanezumab-vfrm (AJOVY) 225 MG/1.5ML SOAJ Inject 225 mg into the skin every 30 (thirty) days. 1.5 mL 11   ibuprofen (ADVIL) 800 MG tablet Take 1 tablet (800 mg total) by mouth every 8 (eight) hours as needed. 30 tablet 5   linaclotide (LINZESS) 145 MCG CAPS capsule Take 1 capsule (145 mcg total) by mouth daily. 30 capsule 3   Magnesium 100 MG CAPS magnesium     MELATONIN GUMMIES PO Take 12 mg by mouth at bedtime. Takes 2 gummies at night     meloxicam (MOBIC) 15 MG tablet Take by mouth.     Pediatric Multivitamins-Iron (FLINTSTONES COMPLETE) 10 MG CHEW Chew by mouth as needed.     Turmeric (QC TUMERIC COMPLEX PO) Take by mouth.     Vitamin D, Ergocalciferol, (DRISDOL) 1.25 MG (50000 UNIT) CAPS capsule Take 1 capsule (50,000 Units total) by mouth every 7 (seven) days. 12 capsule 0   albuterol (VENTOLIN HFA) 108 (90 Base) MCG/ACT inhaler Inhale 2 puffs into the lungs every 6 (six) hours as needed for wheezing or shortness of breath. (Patient not taking: Reported on 07/20/2022) 8 g 0   albuterol (VENTOLIN HFA) 108 (90 Base) MCG/ACT inhaler Inhale 2 puffs into the lungs every 6 (six) hours as needed for wheezing or shortness of breath. (Patient not taking: Reported on 07/20/2022) 6.7 g 0   butalbital-acetaminophen-caffeine (FIORICET, ESGIC) 50-325-40 MG tablet Take 1 tablet by mouth every 6 (six) hours as needed for headache. (Patient not taking: Reported on 09/04/2023) 20 tablet 3   Cholecalciferol (VITAMIN D3) 3000 units TABS Take by mouth daily. (Patient not taking: Reported on 09/04/2023)     omeprazole (PRILOSEC) 20 MG capsule Take 1 capsule (20 mg total) by mouth daily. (Patient not taking: Reported on 08/25/2023) 90 capsule 1   ondansetron (ZOFRAN-ODT) 4 MG disintegrating tablet Take 1-2 tablets (4-8 mg total) by mouth every 8 (eight) hours as needed. (Patient not taking: Reported on 08/25/2023) 30 tablet 3   Rimegepant Sulfate (NURTEC) 75 MG TBDP Take 75 mg by mouth daily as needed. For migraines. Take as  close to onset of migraine as possible. One daily maximum. (Patient not taking: Reported on 08/25/2023) 8 tablet 0   UNABLE TO FIND Med Name: super green gummi by [facility] (Patient not taking: Reported on 09/04/2023)     No current facility-administered medications for this visit.   Known Allergies:  Allergies  Allergen Reactions   Levonorgestrel-Ethinyl Estrad Itching   Past Surgical History: Past Surgical History:  Procedure Laterality Date   COLONOSCOPY  2018   DILATATION & CURETTAGE/HYSTEROSCOPY WITH MYOSURE N/A 07/11/2019   Procedure: DILATATION & CURETTAGE/HYSTEROSCOPY WITH MYOSURE;  Surgeon: Maxie Better, MD;  Location: [facility];  Service: Gynecology;  Laterality: N/A;   DILATATION & CURETTAGE/HYSTEROSCOPY WITH  MYOSURE N/A 04/09/2021   Procedure: DILATATION & CURETTAGE/HYSTEROSCOPY WITH MYOSURE;  Surgeon: Maxie Better, MD;  Location: [facility];  Service: Gynecology;  Laterality: N/A;   LAPAROSCOPIC TUBAL LIGATION Bilateral 11/27/2020   Procedure: LAPAROSCOPIC TUBAL LIGATION with BIPOLAR CAUTERY;  Surgeon: Maxie Better, MD;  Location: [facility];  Service: Gynecology;  Laterality: Bilateral;   WISDOM TOOTH EXTRACTION     2009 x4 removed   Family History: Family History  Problem Relation Age of Onset   Uterine cancer Mother    Alcohol abuse Mother    Arthritis Mother    Depression Mother    Drug abuse Mother    Heart disease Mother    Mental illness Mother    Stroke Mother    Kidney failure Father    Alcohol abuse Father    Diabetes Father    Prostate cancer Father    Colon cancer Father 78   Kidney disease Father    Migraines Father    Depression Brother    Migraines Brother    Colon cancer Maternal Aunt    Arthritis Maternal Grandmother    COPD Maternal Grandmother    Heart attack Maternal Grandmother    Hypertension Maternal Grandmother    Hyperlipidemia Maternal Grandmother    Arthritis Maternal  Grandfather    Heart attack Maternal Grandfather    Hyperlipidemia Maternal Grandfather    Diabetes Paternal Grandfather    Depression Daughter    Asthma Daughter    Depression Daughter    Autism Daughter    Esophageal cancer Neg Hx    Pancreatic cancer Neg Hx    Rectal cancer Neg Hx    Stomach cancer Neg Hx    Thyroid disease Neg Hx    Social History: Davinah lives in a house built 30+ years ago, no water damage, carpet in the bedroom, gas and electric heating, central air windows and fans, goldendoodle indoors, + roaches in her daughter's apartment, using dust mite protection on the bedding but not pillows, no smoke exposure.  She works as a support group as directed, + HEPA filter in the home, [place].   ROS:  All other systems negative except as noted per HPI.  Objective:  Blood pressure 98/64, pulse 88, temperature 98.7 F (37.1 C), temperature source Temporal, height 4' 11.84" (1.52 m), weight 145 lb 3.2 oz (65.9 kg), last menstrual period 04/20/2021, SpO2 97%. Body mass index is 28.51 kg/m. Physical Exam:  General Appearance:  Alert, cooperative, no distress, appears stated age  Head:  Normocephalic, without obvious abnormality, atraumatic  Eyes:  Conjunctiva clear, EOM's intact  Ears EACs normal bilaterally and normal TMs bilaterally  Nose: Nares normal, normal mucosa and no visible anterior polyps  Throat: Lips, tongue normal; teeth and gums normal, normal posterior oropharynx  Neck: Supple, symmetrical  Lungs:   clear to auscultation bilaterally, Respirations unlabored, no coughing  Heart:  regular rate and rhythm and no murmur, Appears well perfused  Extremities: No edema  Skin: Skin color, texture, turgor normal and no rashes or lesions on visualized portions of skin  Neurologic: No gross deficits   Diagnostics: None  Labs:  Lab Orders  No laboratory test(s) ordered today    Assessment and Plan  Discussed the difference  between food intolerance versus food sensitivity versus food allergy.  Unfortunately allergy testing is designed to look for an IgE mediated allergy which she is not having symptoms of. It does sound as if she has allergic  rhinitis, and testing was offered for this, but patient deferred.  We discussed management plan, and if symptoms worsen or do not improve, she will return for environmental allergy testing and treatment plan.  Otherwise will return as needed.  Food Intolerance:  - suspect food intolerance versus other gastrointestinal cause - food allergy testing today not recommended based on your particular symptoms, unfortunately there is no evidence based testing for food intolerance and trial and error based on food journaling is the best way to detect. - food ALLERGY (causes specific set of symptoms from histamine release-can be any combination of hives, nausea, vomiting, diarrhea, trouble breathing, swelling, that occurs quickly after eating a food, resolves within a day and occurs after every exposure to that food) - food INTOLERANCE-certain foods can cause a variety of symptoms on a consistent basis, not necessarily histamine related symptoms as above (can be headaches, bloating, brain fog, etc), typically allergy testing is negative; avoidance is still recommended but no need to carry an epinephrine autoinjector - food journaling can be helpful to see if a specific food is causing symptoms; if unsure, remove from diet and if symptoms resolve, try adding back in.  If symptoms return, this food is likely causing symptoms and should be eaten in reduced quantities or avoided altogether. - if you remain symptomatic, consider referral to gastroenterologist  Chronic Rhinitis: SUSPECTED ALLERGIC  - allergy testing today: deferred, consider updating at your convenience if you are interested in pursuing specific avoidance measures including allergy injections. - Symptom control: - Consider Nasal  Steroid Spray: Best results if used daily. - Options include Flonase (fluticasone), Nasocort (triamcinolone), Nasonex (mometasome) 1- 2 sprays in each nostril daily.  - All can be purchased over-the-counter if not covered by insurance. - Consider Antihistamine: daily or daily as needed.   -Options include Zyrtec (Cetirizine) 10mg , Claritin (Loratadine) 10mg , Allegra (Fexofenadine) 180mg , or Xyzal (Levocetirinze) 5mg  - Can be purchased over-the-counter if not covered by insurance.  Suspected Allergic Conjunctivitis:  - Start Allergy Eye drops-great options include Pataday  EXTRA STRENGTH (Olopatadine) or Zaditor (ketotifen) for eye symptoms daily as needed-both sold over the counter if not covered by insurance.  -Avoid eye drops that say red eye relief as they may contain medications that dry out your eyes.  Follow up : as needed It was a pleasure meeting you in clinic today! Thank you for allowing me to participate in your care.  This note in its entirety was forwarded to the Provider who requested this consultation.  Other: none  Thank you for your kind referral. I appreciate the opportunity to take part in Chelsey's care. Please do not hesitate to contact me with questions.  Sincerely,  Tonny Bollman, MD [facility]

## 2023-09-04 ENCOUNTER — Encounter: Payer: Self-pay | Admitting: Internal Medicine

## 2023-09-04 ENCOUNTER — Ambulatory Visit (INDEPENDENT_AMBULATORY_CARE_PROVIDER_SITE_OTHER): Payer: Managed Care, Other (non HMO) | Admitting: Internal Medicine

## 2023-09-04 ENCOUNTER — Other Ambulatory Visit: Payer: Self-pay

## 2023-09-04 VITALS — BP 98/64 | HR 88 | Temp 98.7°F | Ht 59.84 in | Wt 145.2 lb

## 2023-09-04 DIAGNOSIS — J310 Chronic rhinitis: Secondary | ICD-10-CM

## 2023-09-04 DIAGNOSIS — T781XXA Other adverse food reactions, not elsewhere classified, initial encounter: Secondary | ICD-10-CM

## 2023-09-04 NOTE — Patient Instructions (Signed)
Food Intolerance:  - suspect food intolerance versus other gastrointestinal cause - food allergy testing today not recommended based on your particular symptoms, unfortunately there is no evidence based testing for food intolerance and trial and error based on food journaling is the best way to detect. - food ALLERGY (causes specific set of symptoms from histamine release-can be any combination of hives, nausea, vomiting, diarrhea, trouble breathing, swelling, that occurs quickly after eating a food, resolves within a day and occurs after every exposure to that food) - food INTOLERANCE-certain foods can cause a variety of symptoms on a consistent basis, not necessarily histamine related symptoms as above (can be headaches, bloating, brain fog, etc), typically allergy testing is negative; avoidance is still recommended but no need to carry an epinephrine autoinjector - food journaling can be helpful to see if a specific food is causing symptoms; if unsure, remove from diet and if symptoms resolve, try adding back in.  If symptoms return, this food is likely causing symptoms and should be eaten in reduced quantities or avoided altogether. - if you remain symptomatic, consider referral to gastroenterologist  Chronic Rhinitis: SUSPECTED ALLERGIC  - allergy testing today: deferred, consider updating at your convenience if you are interested in pursuing specific avoidance measures including allergy injections. - Symptom control: - Consider Nasal Steroid Spray: Best results if used daily. - Options include Flonase (fluticasone), Nasocort (triamcinolone), Nasonex (mometasome) 1- 2 sprays in each nostril daily.  - All can be purchased over-the-counter if not covered by insurance. - Consider Antihistamine: daily or daily as needed.   -Options include Zyrtec (Cetirizine) 10mg , Claritin (Loratadine) 10mg , Allegra (Fexofenadine) 180mg , or Xyzal (Levocetirinze) 5mg  - Can be purchased over-the-counter if not  covered by insurance.  Suspected Allergic  Conjunctivitis:  - Start Allergy Eye drops-great options include Pataday  EXTRA STRENGTH (Olopatadine) or Zaditor (ketotifen) for eye symptoms daily as needed-both sold over the counter if not covered by insurance.  -Avoid eye drops that say red eye relief as they may contain medications that dry out your eyes.  Follow up : as needed It was a pleasure meeting you in clinic today! Thank you for allowing me to participate in your care.  Tonny Bollman, MD [facility]

## 2023-09-06 ENCOUNTER — Telehealth: Payer: Self-pay | Admitting: *Deleted

## 2023-09-06 NOTE — Telephone Encounter (Signed)
Called patient to inform of recommendations per Ms. Wilmon Pali, [place]. Left information about repeating the Miralax for constipation. Also informed the patient the ABD scan was not necessary for stool check. I left a message for the patient to return my call.

## 2023-09-07 NOTE — Telephone Encounter (Signed)
See patient advise request message.

## 2023-09-08 ENCOUNTER — Telehealth: Payer: Self-pay | Admitting: *Deleted

## 2023-09-08 NOTE — Telephone Encounter (Signed)
Patient returned call in reference to message the nurse left instructing the patient to repeat Miralax. Patient states she will go ahead and repeat the Miralax and has started taking the Linzess x's 1 week with noted side effects of loose stools. Patient has already been informed she would have loose stools with the start of the Linzess for approx 2 weeks. Patient also states she is continuing to have back pain as well as rectal pain. Informed the patient to continue the Linzess and repeat the Miralax after taking the Linzess for 2 weeks and no satisfaction noted with the BM. Patient called back to clarify information concerning the Linzess and Miralax, to aviod dehydration. Patient understood and agreed.

## 2023-09-13 ENCOUNTER — Ambulatory Visit (INDEPENDENT_AMBULATORY_CARE_PROVIDER_SITE_OTHER): Payer: Managed Care, Other (non HMO) | Admitting: Family Medicine

## 2023-09-13 ENCOUNTER — Encounter: Payer: Self-pay | Admitting: Family Medicine

## 2023-09-13 VITALS — BP 98/68 | HR 70 | Temp 97.7°F | Ht 59.0 in | Wt 142.2 lb

## 2023-09-13 DIAGNOSIS — M5441 Lumbago with sciatica, right side: Secondary | ICD-10-CM

## 2023-09-13 DIAGNOSIS — K581 Irritable bowel syndrome with constipation: Secondary | ICD-10-CM

## 2023-09-13 DIAGNOSIS — M5442 Lumbago with sciatica, left side: Secondary | ICD-10-CM | POA: Diagnosis not present

## 2023-09-13 NOTE — Progress Notes (Signed)
Established Patient Office Visit   Subjective  Patient ID: Whitney Kent, female    DOB: Sep 26, 1971  Age: 52 y.o. MRN: [id number]  Chief Complaint  Patient presents with   Back Pain    Left side started end of aug, patient was seen in [facility] and urgent care     Patient is a 52 year old female seen for follow-up on ongoing concerns.  Patient seen at [facility] early September for low back pain and bilateral posterior leg pain.  Constipation also noted at that time.  Given Toradol and muscle relaxer.  Seen in ED for worsening sciatic pain and LLQ/pelvic pain.  Now only having pain in posterior left leg, 4.5/10.  Sitting is uncomfortable.  Patient in contact with GI regarding chronic constipation.  Linzess started.  Initially with diarrhea, now back to constipation.  Advised to do bowel prep.  Patient seen by Dr. Cherly Hensen, OB/GYN.  Ultrasound with fibroids and thickening of the uterine lining.  Endometrial biopsy discussed, to be scheduled.    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  Pt had 1 seizure after this dx.  Not sure if related to dx or not. maw   History of asthma    none since age 35   History of depression    Seizures (HCC)    2004. only 1 seizure   Stroke (HCC) 2019   mild no residual deficit found  on mri   Syncope    occ with migraine fatigue with aura and migraine   Vitamin D deficiency    Past Surgical History:  Procedure Laterality Date   COLONOSCOPY  2018   DILATATION & CURETTAGE/HYSTEROSCOPY WITH MYOSURE N/A 07/11/2019   Procedure: DILATATION & CURETTAGE/HYSTEROSCOPY WITH MYOSURE;  Surgeon: Maxie Better, MD;  Location: [facility];  Service: Gynecology;  Laterality: N/A;   DILATATION & CURETTAGE/HYSTEROSCOPY WITH MYOSURE N/A 04/09/2021   Procedure: DILATATION & CURETTAGE/HYSTEROSCOPY WITH MYOSURE;  Surgeon: Maxie Better, MD;  Location: [facility];  Service: Gynecology;  Laterality: N/A;   LAPAROSCOPIC TUBAL LIGATION Bilateral 11/27/2020   Procedure: LAPAROSCOPIC TUBAL LIGATION with BIPOLAR CAUTERY;  Surgeon: Maxie Better, MD;  Location: [facility] ;  Service: Gynecology;  Laterality: Bilateral;   WISDOM TOOTH EXTRACTION     2009 x4 removed   Social History   Tobacco Use   Smoking status: Never   Smokeless tobacco: Never  Vaping Use   Vaping status: Never Used  Substance Use Topics   Alcohol use: Yes    Comment: ocas   Drug use: No   Family History  Problem Relation Age of Onset   Uterine cancer Mother    Alcohol abuse Mother    Arthritis Mother  Depression Mother    Drug abuse Mother    Heart disease Mother    Mental illness Mother    Stroke Mother    Kidney failure Father    Alcohol abuse Father    Diabetes Father    Prostate cancer Father    Colon cancer Father 52   Kidney disease Father    Migraines Father    Depression Brother    Migraines Brother    Colon cancer Maternal Aunt    Arthritis Maternal Grandmother    COPD Maternal Grandmother    Heart attack Maternal Grandmother    Hypertension Maternal Grandmother    Hyperlipidemia Maternal Grandmother    Arthritis Maternal Grandfather    Heart attack Maternal Grandfather    Hyperlipidemia Maternal Grandfather    Diabetes Paternal  Grandfather    Depression Daughter    Asthma Daughter    Depression Daughter    Autism Daughter    Esophageal cancer Neg Hx    Pancreatic cancer Neg Hx    Rectal cancer Neg Hx    Stomach cancer Neg Hx    Thyroid disease Neg Hx    Allergies  Allergen Reactions   Dust Mite Extract Itching   No Known Allergies Other (See Comments)   Levonorgestrel-Ethinyl Estrad Itching      ROS Negative unless stated above    Objective:     BP 98/68 (BP Location: Left Arm, Patient Position: Sitting, Cuff Size: Normal)   Pulse 70   Temp 97.7 F (36.5 C) (Oral)   Ht 4\' 11"  (1.499 m)   Wt 142 lb 3.2 oz (64.5 kg)   LMP 04/20/2021   SpO2 98%   BMI 28.72 kg/m  BP Readings from Last 3 Encounters:  09/13/23 98/68  09/04/23 98/64  08/29/23 (!) 115/98   Wt Readings from Last 3 Encounters:  09/13/23 142 lb 3.2 oz (64.5 kg)  09/04/23 145 lb 3.2 oz (65.9 kg)  08/25/23 147 lb 4 oz (66.8 kg)      Physical Exam Constitutional:      General: She is not in acute distress.    Appearance: Normal appearance.  HENT:     Head: Normocephalic and atraumatic.     Nose: Nose normal.     Mouth/Throat:     Mouth: Mucous membranes are moist.  Cardiovascular:     Rate and Rhythm: Normal rate and regular rhythm.     Heart sounds: Normal heart sounds. No murmur heard.    No gallop.  Pulmonary:     Effort: Pulmonary effort is normal. No respiratory distress.     Breath sounds: Normal breath sounds. No wheezing, rhonchi or rales.  Abdominal:     General: Bowel sounds are normal.     Palpations: Abdomen is soft.  Musculoskeletal:     Comments: No TTP in cervical, thoracic and paraspinal muscles.  TTP of bilateral lumbar spine and posterior leg.  Skin:    General: Skin is warm and dry.  Neurological:     Mental Status: She is alert and oriented to person, place, and time.      No results found for any visits on 09/13/23.    Assessment & Plan:  Acute bilateral low back pain with bilateral  sciatica -     Ambulatory referral to Physical Therapy  Irritable bowel syndrome with constipation  Patient seen for ongoing bilateral low back pain with sciatica.  Some improvement noted in symptoms.  Discussed treatment options including stretching, massage, heat, topical analgesics,  NSAIDs.  Referral for PT placed.  Continue current bowel prep per GI.  Patient encouraged to schedule follow-up with GI given continued symptoms despite Linzess.  Given precautions.  Return if symptoms worsen or fail to improve.   Deeann Saint, MD

## 2023-09-13 NOTE — Patient Instructions (Signed)
Referral for physical therapy was placed.  Patient expect a phone call about setting up this appointment.

## 2023-09-19 ENCOUNTER — Encounter: Payer: Self-pay | Admitting: Family Medicine

## 2023-09-19 NOTE — Telephone Encounter (Signed)
Ok to send note.

## 2023-10-04 ENCOUNTER — Other Ambulatory Visit: Payer: Self-pay

## 2023-10-04 ENCOUNTER — Ambulatory Visit: Payer: Managed Care, Other (non HMO) | Attending: Family Medicine

## 2023-10-04 DIAGNOSIS — M5416 Radiculopathy, lumbar region: Secondary | ICD-10-CM | POA: Diagnosis present

## 2023-10-04 DIAGNOSIS — M5441 Lumbago with sciatica, right side: Secondary | ICD-10-CM | POA: Insufficient documentation

## 2023-10-04 DIAGNOSIS — M5442 Lumbago with sciatica, left side: Secondary | ICD-10-CM | POA: Diagnosis not present

## 2023-10-04 DIAGNOSIS — M79605 Pain in left leg: Secondary | ICD-10-CM | POA: Diagnosis present

## 2023-10-04 NOTE — Therapy (Signed)
OUTPATIENT PHYSICAL THERAPY EVALUATION   Patient Name: Whitney Kent MRN: [id number] DOB:[birth year], 52 y.o., female Today's Date: 10/04/2023  END OF SESSION:  PT End of Session - 10/05/23 1511     Visit Number 1    Number of Visits 7    Date for PT Re-Evaluation 11/15/23    Authorization Type [employer]    PT Start Time 0930    PT Stop Time 1000    PT Time Calculation (min) 30 min    Behavior During Therapy [facility] for tasks assessed/performed             Past Medical History:  Diagnosis Date   ADHD dx feb 2022   no meds for    Allergy    Anemia    Anxiety    Family history of adverse reaction to anesthesia    daughter gets hiccups after anesthesia   GERD (gastroesophageal reflux disease)    Goiter right neck   dr Lucianne Muss follows lov 01-28-2021   last [place] 04-03-2018   Hemiplegic migraine    pt dx 2004.  Pt had 1 seizure after this dx.  Not sure if related to dx or not. maw   History of asthma    none since age 35   History of depression    Seizures (HCC)    2004. only 1 seizure   Stroke (HCC) 2019   mild no residual deficit found on mri   Syncope    occ with migraine fatigue with aura and migraine   Vitamin D deficiency    Past Surgical History:  Procedure Laterality Date   COLONOSCOPY  2018   DILATATION & CURETTAGE/HYSTEROSCOPY WITH MYOSURE N/A 07/11/2019   Procedure: DILATATION & CURETTAGE/HYSTEROSCOPY WITH MYOSURE;  Surgeon: Maxie Better, MD;  Location: [facility];  Service: Gynecology;  Laterality: N/A;   DILATATION & CURETTAGE/HYSTEROSCOPY WITH MYOSURE N/A 04/09/2021   Procedure: DILATATION & CURETTAGE/HYSTEROSCOPY WITH MYOSURE;  Surgeon: Maxie Better, MD;  Location: [facility];  Service: Gynecology;  Laterality: N/A;   LAPAROSCOPIC TUBAL LIGATION Bilateral 11/27/2020   Procedure: LAPAROSCOPIC TUBAL LIGATION with BIPOLAR CAUTERY;  Surgeon: Maxie Better, MD;  Location: [facility];  Service:  Gynecology;  Laterality: Bilateral;   WISDOM TOOTH EXTRACTION     2009 x4 removed   Patient Active Problem List   Diagnosis Date Noted   Chronic migraine without aura, intractable, with status migrainosus 04/19/2022   Frozen shoulder 07/28/2021   Attention deficit hyperactivity disorder, combined type 04/25/2021   Cobalamin deficiency 04/25/2021   Shoulder pain 04/25/2021   Menopause 08/31/2020   Hemiplegic migraine 02/05/2018   Goiter 12/20/1998    PCP: Deeann Saint, MD   REFERRING PROVIDER: Deeann Saint, MD  REFERRING DIAG: Acute bilateral low back pain with bilateral sciatica [M54.42, M54.41]   Rationale for Evaluation and Treatment: Rehabilitation  THERAPY DIAG:  Radiculopathy, lumbar region  Pain in left leg  ONSET DATE: 08/29/23 ED visit   SUBJECTIVE:  SUBJECTIVE STATEMENT: Patient reports to PT with LBP that began at end of August. It began with BIL low back pain with radiating symptoms posteriorly above knee. She states that Rt side improved, but Lt side worsened. Including anterior pain. She states that her pain can be worse with certain movements. She felts little to no relief with prescribed medications since onset of symptoms. She notices that she has less overall pain related to treatment/relief of constipation pain. She also feel that her left leg is weaker since start of pain. She also feels that her gait has been changed.   PERTINENT HISTORY:  PMHx includes ADHD, Anxiety, Anemia, Hemiplegic Migraine, Seizure (2004), Stroke (no residual deficit found on MRI), syncope  PAIN:  Are you having pain? Yes: NPRS scale: 1/10 current, 4/10 worst Pain location: center to left sided glute, posterior Lt lower extremity to mid calf  Pain description: "uncomfortable",  tight Aggravating factors: prolonged driving as needed for 4 hour work-related commuting Relieving factors: treatment of constipation/bowels, epsom salt bath, cushion, anti-inflammatory foods  PRECAUTIONS: None  RED FLAGS: None   WEIGHT BEARING RESTRICTIONS: No  FALLS:  Has patient fallen in last 6 months? No  LIVING ENVIRONMENT: Lives with: lives with their family   OCCUPATION: Passenger transport manager at [employer]   PLOF: Independent  PATIENT GOALS: to have less pain with daily activities including commuting  NEXT MD VISIT: 11/10/23 with GI   OBJECTIVE:  Note: Objective measures were completed at Evaluation unless otherwise noted.  DIAGNOSTIC FINDINGS:  No relevant imaging results available in epic; none included in referral    PATIENT SURVEYS:  FOTO 47 current, 61 predicted   COGNITION: Overall cognitive status: Within functional limits for tasks assessed      LUMBAR ROM:   AROM eval  Flexion 80%  Extension 50%  Right lateral flexion 50%  Left lateral flexion 50%  Right rotation 50%  Left rotation 50%   (Blank rows = not tested)   LOWER EXTREMITY MMT:    MMT Right eval Left eval  Hip flexion 4 4  Hip extension    Hip abduction 4 4  Hip adduction    Hip internal rotation    Hip external rotation    Knee flexion 4 4  Knee extension 4 4  Ankle dorsiflexion 4+ 4+  Ankle plantarflexion    Ankle inversion    Ankle eversion     (Blank rows = not tested)  LUMBAR SPECIAL TESTS:  Straight leg raise test: Positive   TODAY'S TREATMENT:                                                                                                                               [facility] Adult PT Treatment:  DATE: 10/04/2023   Initial evaluation: see patient education and home exercise program as noted below      PATIENT EDUCATION:  Education details: reviewed initial home exercise program; discussion of POC,  prognosis and goals for skilled PT; discussed use of additional cushions and use of standing rest breaks to improve tolerance of prolonged sitting as required for work commute and traveling to visit family    Person educated: Patient Education method: Programmer, multimedia, Facilities manager, and Handouts Education comprehension: verbalized understanding, returned demonstration, and needs further education  HOME EXERCISE PROGRAM: Access Code: [email or web address] URL: [email or web address] Date: 10/04/2023 Prepared by: Mauri Reading  Exercises - Seated Slump Nerve Glide  - 1 x daily - 7 x weekly - 2 sets - 10 reps - Supine Sciatic Nerve Glide  - 1 x daily - 7 x weekly - 2 sets - 10 reps - Supine Piriformis Stretch with Foot on Ground  - 1 x daily - 7 x weekly - 2 sets - 20-30 sec hold  ASSESSMENT:  CLINICAL IMPRESSION: Patient is a 52 y.o. female who was seen today for physical therapy evaluation and treatment for Low Back Pain with radicular symptoms to L>R LE. She is demonstrating decreased LS AROM, decreased LE MMT, and positive SLR test. She responded well to initial sciatic nerve glide exercises. Provided patient education today regarding use of seated and lumbar support with long commutes to improve tolerance of daily activities with decreased symptom exacerbation. She has related pain and difficulty with prolonged sitting, driving, overhead lifting, stair navigation, and participation in normal social activities. She requires skilled PT services at this time to address relevant deficits and improve overall function.     OBJECTIVE IMPAIRMENTS: decreased activity tolerance, decreased endurance, decreased mobility, decreased strength, and pain.   ACTIVITY LIMITATIONS: carrying, lifting, sitting, and sleeping  PARTICIPATION LIMITATIONS: cleaning, driving, community activity, and occupation  PERSONAL FACTORS: Past/current experiences, Time since onset of injury/illness/exacerbation, and 3+  comorbidities: PMHx includes ADHD, Anxiety, Anemia, Hemiplegic Migraine, Seizure (2004), Stroke (no residual deficit found on MRI), syncope  are also affecting patient's functional outcome.   REHAB POTENTIAL: Fair    CLINICAL DECISION MAKING: Evolving/moderate complexity  EVALUATION COMPLEXITY: Moderate   GOALS: Goals reviewed with patient? Yes  SHORT TERM GOALS: Target date: 10/27/2023   Patient will be independent with initial home program for symptom modulation activities.  Baseline: provided at eval  Goal status: INITIAL   LONG TERM GOALS: Target date: 11/17/2023  Patient will report improved overall functional ability with FOTO score of 60 or greater.  Baseline: 47 Goal status: INITIAL  2.  Patient will demonstrate at least 4+/5 MMT BIL with resisted testing.  Baseline: grossly 4/5 MMT BIL Goal status: INITIAL  3.  Patient will demonstrate at least 75% LS AROM in all directions  Baseline: see objective measures Goal status: INITIAL  4.  Patient will report improved tolerance of driving as requires for her occupational duties, with use of lumbar support, and standing/stretching breaks as appropriate.  Baseline: moderate-to-severe pain with driving for work duties  Goal status: INITIAL    PLAN:  PT FREQUENCY: 1x/week  PT DURATION: 6 weeks  PLANNED INTERVENTIONS: 97164- PT Re-evaluation, 97110-Therapeutic exercises, 97530- Therapeutic activity, [id number]- Neuromuscular re-education, 97535- Self Care, [id number]- Manual therapy, 97014- Electrical stimulation (unattended), [id number]- Electrical stimulation (manual), Taping, Dry Needling, Joint mobilization, Spinal mobilization, Cryotherapy, and Moist heat.  PLAN FOR NEXT SESSION: lumbar mobility, nerve glides, pain centralization activities, including repeated movements as appropriate, core strengthening, aerobic activity, lifting mechanics.  Mauri Reading, PT, DPT   10/06/2023, 3:48 PM

## 2023-10-11 ENCOUNTER — Encounter: Payer: Self-pay | Admitting: Physical Therapy

## 2023-10-11 ENCOUNTER — Ambulatory Visit: Payer: Managed Care, Other (non HMO) | Admitting: Physical Therapy

## 2023-10-11 DIAGNOSIS — M5416 Radiculopathy, lumbar region: Secondary | ICD-10-CM | POA: Diagnosis not present

## 2023-10-11 DIAGNOSIS — M79605 Pain in left leg: Secondary | ICD-10-CM

## 2023-10-11 NOTE — Therapy (Signed)
Daily Note   Patient Name: Whitney Kent MRN: [id number] DOB:Apr 27, 1971, 52 y.o., female Today's Date: 10/04/2023  END OF SESSION:  PT End of Session - 10/11/23 1224     Visit Number 2    Number of Visits 7    Date for PT Re-Evaluation 11/15/23    Authorization Type [employer]    PT Start Time 1223    PT Stop Time 1301    PT Time Calculation (min) 38 min    Behavior During Therapy [facility] for tasks assessed/performed             Past Medical History:  Diagnosis Date   ADHD dx feb 2022   no meds for    Allergy    Anemia    Anxiety    Family history of adverse reaction to anesthesia    daughter gets hiccups after anesthesia   GERD (gastroesophageal reflux disease)    Goiter right neck   dr Lucianne Muss follows lov 01-28-2021   last [place] 04-03-2018   Hemiplegic migraine    pt dx 2004.  Pt had 1 seizure after this dx.  Not sure if related to dx or not. maw   History of asthma    none since age 49   History of depression    Seizures (HCC)    2004. only 1 seizure   Stroke (HCC) 2019   mild no residual deficit found on mri   Syncope    occ with migraine fatigue with aura and migraine   Vitamin D deficiency    Past Surgical History:  Procedure Laterality Date   COLONOSCOPY  2018   DILATATION & CURETTAGE/HYSTEROSCOPY WITH MYOSURE N/A 07/11/2019   Procedure: DILATATION & CURETTAGE/HYSTEROSCOPY WITH MYOSURE;  Surgeon: Maxie Better, MD;  Location: [facility];  Service: Gynecology;  Laterality: N/A;   DILATATION & CURETTAGE/HYSTEROSCOPY WITH MYOSURE N/A 04/09/2021   Procedure: DILATATION & CURETTAGE/HYSTEROSCOPY WITH MYOSURE;  Surgeon: Maxie Better, MD;  Location: [facility];  Service: Gynecology;  Laterality: N/A;   LAPAROSCOPIC TUBAL LIGATION Bilateral 11/27/2020   Procedure: LAPAROSCOPIC TUBAL LIGATION with BIPOLAR CAUTERY;  Surgeon: Maxie Better, MD;  Location: [facility] ;  Service: Gynecology;  Laterality:  Bilateral;   WISDOM TOOTH EXTRACTION     2009 x4 removed   Patient Active Problem List   Diagnosis Date Noted   Chronic migraine without aura, intractable, with status migrainosus 04/19/2022   Frozen shoulder 07/28/2021   Attention deficit hyperactivity disorder, combined type 04/25/2021   Cobalamin deficiency 04/25/2021   Shoulder pain 04/25/2021   Menopause 08/31/2020   Hemiplegic migraine 02/05/2018   Goiter 12/20/1998    PCP: Deeann Saint, MD   REFERRING PROVIDER: Deeann Saint, MD  REFERRING DIAG: Acute bilateral low back pain with bilateral sciatica [M54.42, M54.41]   Rationale for Evaluation and Treatment: Rehabilitation  THERAPY DIAG:  Radiculopathy, lumbar region  Pain in left leg  ONSET DATE: 08/29/23 ED visit   SUBJECTIVE:  SUBJECTIVE STATEMENT: Pt reports that her pain has been improving with her HEP.  She currently rates her pain as .5/10 and the max it has reached is 3/10.  PERTINENT HISTORY:  PMHx includes ADHD, Anxiety, Anemia, Hemiplegic Migraine, Seizure (2004), Stroke (no residual deficit found on MRI), syncope  PAIN:  Are you having pain? Yes: NPRS scale: 1/10 current, 3/10 worst Pain location: center to left sided glute, posterior Lt lower extremity to mid calf  Pain description: "uncomfortable", tight Aggravating factors: prolonged driving as needed for 4 hour work-related commuting Relieving factors: treatment of constipation/bowels, epsom salt bath, cushion, anti-inflammatory foods  PRECAUTIONS: None  RED FLAGS: None   WEIGHT BEARING RESTRICTIONS: No  FALLS:  Has patient fallen in last 6 months? No  LIVING ENVIRONMENT: Lives with: lives with their family   OCCUPATION: Passenger transport manager at [employer]   PLOF: Independent  PATIENT  GOALS: to have less pain with daily activities including commuting  NEXT MD VISIT: 11/10/23 with GI   OBJECTIVE:  Note: Objective measures were completed at Evaluation unless otherwise noted.  DIAGNOSTIC FINDINGS:  No relevant imaging results available in epic; none included in referral    PATIENT SURVEYS:  FOTO 47 current, 61 predicted   COGNITION: Overall cognitive status: Within functional limits for tasks assessed      LUMBAR ROM:   AROM eval  Flexion 80%  Extension 50%  Right lateral flexion 50%  Left lateral flexion 50%  Right rotation 50%  Left rotation 50%   (Blank rows = not tested)   LOWER EXTREMITY MMT:    MMT Right eval Left eval  Hip flexion 4 4  Hip extension    Hip abduction 4 4  Hip adduction    Hip internal rotation    Hip external rotation    Knee flexion 4 4  Knee extension 4 4  Ankle dorsiflexion 4+ 4+  Ankle plantarflexion    Ankle inversion    Ankle eversion     (Blank rows = not tested)  LUMBAR SPECIAL TESTS:  Straight leg raise test: Positive   TODAY'S TREATMENT:                                                                                                                               [facility] Adult PT Treatment:  Therapeutic Exercise:  nu-step L5 75m while taking subjective and planning session with patient LTR - 20x Thomas stretch  with contralateral SKTC - 1' ea L sciatic nerve glide in supine Alternating clam - RTB - 2x10 PPT - 2x10  Manual Therapy  S/L QL stretch (no significant change) Lumbar traction, feet on swiss ball    PATIENT EDUCATION:  Education details: reviewed initial home exercise program; discussion of POC, prognosis and goals for skilled PT; discussed use of additional cushions and use of standing rest breaks to improve tolerance of prolonged sitting as required for work commute and traveling to visit family  Person educated: Patient Education method: Explanation, Demonstration, and  Handouts Education comprehension: verbalized understanding, returned demonstration, and needs further education  HOME EXERCISE PROGRAM: Access Code: [email or web address] URL: [email or web address] Date: 10/04/2023 Prepared by: Mauri Reading  Exercises - Seated Slump Nerve Glide  - 1 x daily - 7 x weekly - 2 sets - 10 reps - Supine Sciatic Nerve Glide  - 1 x daily - 7 x weekly - 2 sets - 10 reps - Supine Piriformis Stretch with Foot on Ground  - 1 x daily - 7 x weekly - 2 sets - 20-30 sec hold  ASSESSMENT:  CLINICAL IMPRESSION: Jozelyn tolerated session well with no adverse reaction.  We concentrated on gentle lumbar and hip mobility combined with some gentle hip strengthening.  Pt with no significant benefit from R s/l QL stretch but does report increased relaxation following traction with swiss ball.  Continue per POC.  OBJECTIVE IMPAIRMENTS: decreased activity tolerance, decreased endurance, decreased mobility, decreased strength, and pain.   ACTIVITY LIMITATIONS: carrying, lifting, sitting, and sleeping  PARTICIPATION LIMITATIONS: cleaning, driving, community activity, and occupation  PERSONAL FACTORS: Past/current experiences, Time since onset of injury/illness/exacerbation, and 3+ comorbidities: PMHx includes ADHD, Anxiety, Anemia, Hemiplegic Migraine, Seizure (2004), Stroke (no residual deficit found on MRI), syncope  are also affecting patient's functional outcome.   REHAB POTENTIAL: Fair    CLINICAL DECISION MAKING: Evolving/moderate complexity  EVALUATION COMPLEXITY: Moderate   GOALS: Goals reviewed with patient? Yes  SHORT TERM GOALS: Target date: 10/27/2023   Patient will be independent with initial home program for symptom modulation activities.  Baseline: provided at eval  Goal status: INITIAL   LONG TERM GOALS: Target date: 11/17/2023  Patient will report improved overall functional ability with FOTO score of 60 or greater.  Baseline: 47 Goal status:  INITIAL  2.  Patient will demonstrate at least 4+/5 MMT BIL with resisted testing.  Baseline: grossly 4/5 MMT BIL Goal status: INITIAL  3.  Patient will demonstrate at least 75% LS AROM in all directions  Baseline: see objective measures Goal status: INITIAL  4.  Patient will report improved tolerance of driving as requires for her occupational duties, with use of lumbar support, and standing/stretching breaks as appropriate.  Baseline: moderate-to-severe pain with driving for work duties  Goal status: INITIAL    PLAN:  PT FREQUENCY: 1x/week  PT DURATION: 6 weeks  PLANNED INTERVENTIONS: 97164- PT Re-evaluation, 97110-Therapeutic exercises, 97530- Therapeutic activity, [id number]- Neuromuscular re-education, 97535- Self Care, [id number]- Manual therapy, 97014- Electrical stimulation (unattended), [id number]- Electrical stimulation (manual), Taping, Dry Needling, Joint mobilization, Spinal mobilization, Cryotherapy, and Moist heat.  PLAN FOR NEXT SESSION: lumbar mobility, nerve glides, pain centralization activities, including repeated movements as appropriate, core strengthening, aerobic activity, lifting mechanics.    Kimberlee Nearing Yarel Kilcrease PT  10/11/2023, 1:55 PM

## 2023-10-18 ENCOUNTER — Ambulatory Visit: Payer: Managed Care, Other (non HMO) | Admitting: Physical Therapy

## 2023-10-25 ENCOUNTER — Ambulatory Visit: Payer: 59

## 2023-11-01 ENCOUNTER — Ambulatory Visit: Payer: 59 | Attending: Family Medicine | Admitting: Physical Therapy

## 2023-11-01 ENCOUNTER — Telehealth: Payer: Self-pay | Admitting: Physical Therapy

## 2023-11-01 NOTE — Telephone Encounter (Signed)
Called and informed patient of missed visit and attendance policy via VM.  Canceled last 2 appts and 1 n/s; will require new referral to be seen.

## 2023-11-10 ENCOUNTER — Encounter: Payer: Self-pay | Admitting: Nurse Practitioner

## 2023-11-10 ENCOUNTER — Ambulatory Visit (INDEPENDENT_AMBULATORY_CARE_PROVIDER_SITE_OTHER): Payer: Managed Care, Other (non HMO) | Admitting: Nurse Practitioner

## 2023-11-10 VITALS — BP 98/66 | HR 93 | Ht 59.0 in | Wt 141.1 lb

## 2023-11-10 DIAGNOSIS — K5909 Other constipation: Secondary | ICD-10-CM | POA: Diagnosis not present

## 2023-11-10 NOTE — Patient Instructions (Addendum)
Follow up in 1 year or as needed.  Due to recent changes in healthcare laws, you may see the results of your imaging and laboratory studies on MyChart before your provider has had a chance to review them.  We understand that in some cases there may be results that are confusing or concerning to you. Not all laboratory results come back in the same time frame and the provider may be waiting for multiple results in order to interpret others.  Please give [place] 48 hours in order for your provider to thoroughly review all the results before contacting the office for clarification of your results.   It was a pleasure to see you today!  Thank you for trusting me with your gastrointestinal care!

## 2023-11-10 NOTE — Progress Notes (Signed)
Agree with assessment and plan as outlined.

## 2023-11-10 NOTE — Progress Notes (Signed)
ASSESSMENT    Brief Narrative:  52 y.o.  female known to Dr. Adela Lank with a past medical history not limited to GERD,  chronically altered bowel habits,  ADHD, goiter, CVA, migraines, depression  Chronic constipation.  Tried low dose Linzess which worked well for a few weeks. Lost some efficacy so she stopped it. Constipation has significantly improved with lifestyle changes, dietary changes and discontinuation of oral iron.   Reported history of anemia 2/2 to dysfunctional uterine bleeding related to polyps.  Hgb in normal range for last 5 years but apparently on oral iron the whole time. She had surgery for the uterine polyps in 2022 and no bleeding since. She recently took herself of oral iron which should be fine since no longer having any overt GI blood loss.   See PMH below for additional history  PLAN   --Follow up in one year, sooner if needed.  --Advised her to contact us if hgb declines in future off oral iron  HPI   Chief complaint : Follow up on constipation   Whitney Kent was last seen here 08/25/2023  at that time for evaluation of worsening constipation despite OTC medications. She was given a MiraLAX bowel purge and started on Linzess 145 mcg daily.  Advised to hold the Dulcolax, stool softeners and MiraLAX that she had been taking.  Plan was to follow-up in 4 to 6 weeks and consider colonoscopy if no improvement.    Interval history:  Whitney Kent did the bowel purge,  ended up doing a second one. Linzess worked well for a few weeks then lost efficacy so she stopped it.  She made some lifestyle changes and stopped iron gummies and bowels are moving well these days.  She hasn't been having any abdominal pain.  Saw PT for sciatica and hip pain and doing better from that standpoint. Trying not to be sedentary. She made some dietary changes. She started Whitney Kent. Occasionally has a dry stool stool for which she will take a dose of Miralax.    Whitney Kent a history of anemia  2/2 to dysfunctional uterine bleeding from endometrial polyps. Started on oral iron at some point ( ? When). Hgb has been normal for the last 5 years on oral iron.   In April 2022 she underwent resection of the polyps. She continued to take oral iron and was taking iron gummies at time of our last visit. No vaginal bleeding since surgery in 2022. She decided to stop iron gummies following our visit in Sept   GI History / Pertinent GI Studies   **All endoscopic studies may not be included here    EGD September 2022 for dyspepsia, GERD, early satiety.  -- 1 cm hiatal hernia.  -- Normal esophagus, stomach and duodenum.  Stomach biopsied 1. Surgical [P], cecum, polyp - BENIGN POLYPOID COLORECTAL-TYPE MUCOSA. - THERE IS NO EVIDENCE OF MALIGNANCY. 2. Surgical [P], random sites - BENIGN COLONIC MUCOSA. - NO SIGNIFICANT INFLAMMATION OR OTHER ABNORMALITIES IDENTIFIED.   Screening colonoscopy December 2018 -- Good prep -- Diverticulosis of the cecum the left colon -- A 3 mm polyp was removed from the cecum -- Biopsies taken from the right, left and transverse colon for evaluation of microscopic colitis    Diagnosis 1. Surgical [P], cecum, polyp - BENIGN POLYPOID COLORECTAL-TYPE MUCOSA. - THERE IS NO EVIDENCE OF MALIGNANCY. 2. Surgical [P], random sites - BENIGN COLONIC MUCOSA. - NO SIGNIFICANT INFLAMMATION OR OTHER ABNORMALITIES IDENTIFIED.      Latest Ref Rng &  Units 04/20/2023   10:49 AM 04/18/2022    8:52 AM 04/15/2021   10:51 AM  Hepatic Function  Total Protein 6.0 - 8.3 g/dL 6.9  7.2  6.7   Albumin 3.5 - 5.2 g/dL 4.1  4.3  4.1   AST 0 - 37 U/L 15  16  15    ALT 0 - 35 U/L 10  10  10    Alk Phosphatase 39 - 117 U/L 63  58  55   Total Bilirubin 0.2 - 1.2 mg/dL 0.4  0.5  0.3        Latest Ref Rng & Units 06/08/2023    9:09 PM 04/20/2023   10:49 AM 04/18/2022    8:52 AM  CBC  WBC 4.0 - 10.5 K/uL 7.0  5.7  5.3   Hemoglobin 12.0 - 15.0 g/dL 16.1  09.6  04.5   Hematocrit 36.0 - 46.0 %  36.3  41.4  41.6   Platelets 150 - 400 K/uL 252  254.0  265.0     Past Medical History:  Diagnosis Date   ADHD dx feb 2022   no meds for    Allergy    Anemia    Anxiety    Family history of adverse reaction to anesthesia    daughter gets hiccups after anesthesia   GERD (gastroesophageal reflux disease)    Goiter right neck   dr Lucianne Muss follows lov 01-28-2021   last [place] 04-03-2018   Hemiplegic migraine    pt dx 2004.  Pt had 1 seizure after this dx.  Not sure if related to dx or not. maw   History of asthma    none since age 32   History of depression    Seizures (HCC)    2004. only 1 seizure   Stroke (HCC) 2019   mild no residual deficit found on mri   Syncope    occ with migraine fatigue with aura and migraine   Vitamin D deficiency     Past Surgical History:  Procedure Laterality Date   COLONOSCOPY  2018   DILATATION & CURETTAGE/HYSTEROSCOPY WITH MYOSURE N/A 07/11/2019   Procedure: DILATATION & CURETTAGE/HYSTEROSCOPY WITH MYOSURE;  Surgeon: Maxie Better, MD;  Location: [facility];  Service: Gynecology;  Laterality: N/A;   DILATATION & CURETTAGE/HYSTEROSCOPY WITH MYOSURE N/A 04/09/2021   Procedure: DILATATION & CURETTAGE/HYSTEROSCOPY WITH MYOSURE;  Surgeon: Maxie Better, MD;  Location: [facility];  Service: Gynecology;  Laterality: N/A;   LAPAROSCOPIC TUBAL LIGATION Bilateral 11/27/2020   Procedure: LAPAROSCOPIC TUBAL LIGATION with BIPOLAR CAUTERY;  Surgeon: Maxie Better, MD;  Location: [facility] [place];  Service: Gynecology;  Laterality: Bilateral;   WISDOM TOOTH EXTRACTION     2009 x4 removed    Family History  Problem Relation Age of Onset   Uterine cancer Mother    Alcohol abuse Mother    Arthritis Mother    Depression Mother    Drug abuse Mother    Heart disease Mother    Mental illness Mother    Stroke Mother    Kidney failure Father    Alcohol abuse Father    Diabetes Father    Prostate cancer  Father    Colon cancer Father 57   Kidney disease Father    Migraines Father    Depression Brother    Migraines Brother    Colon cancer Maternal Aunt    Arthritis Maternal Grandmother    COPD Maternal Grandmother    Heart attack Maternal Grandmother  Hypertension Maternal Grandmother    Hyperlipidemia Maternal Grandmother    Arthritis Maternal Grandfather    Heart attack Maternal Grandfather    Hyperlipidemia Maternal Grandfather    Diabetes Paternal Grandfather    Depression Daughter    Asthma Daughter    Depression Daughter    Autism Daughter    Esophageal cancer Neg Hx    Pancreatic cancer Neg Hx    Rectal cancer Neg Hx    Stomach cancer Neg Hx    Thyroid disease Neg Hx     Current Medications, Allergies, Family History and Social History were reviewed in [employer] electronic medical record.     Current Outpatient Medications  Medication Sig Dispense Refill   bisacodyl (DULCOLAX) 5 MG EC tablet Take 10 mg by mouth as needed for moderate constipation.     cetirizine (ZYRTEC) 10 MG tablet Take 10 mg by mouth as needed. OTC     Cholecalciferol (VITAMIN D3) 3000 units TABS Take by mouth daily.     ibuprofen (ADVIL) 800 MG tablet Take 1 tablet (800 mg total) by mouth every 8 (eight) hours as needed. 30 tablet 5   Magnesium 100 MG CAPS magnesium     Magnesium Gluconate (MAGNESIUM 27 PO)      Turmeric (QC TUMERIC COMPLEX PO) Take by mouth.     No current facility-administered medications for this visit.    Review of Systems: No chest pain. No shortness of breath. No urinary complaints.    Physical Exam  There were no vitals filed for this visit. Wt Readings from Last 3 Encounters:  09/13/23 142 lb 3.2 oz (64.5 kg)  09/04/23 145 lb 3.2 oz (65.9 kg)  08/25/23 147 lb 4 oz (66.8 kg)    LMP 04/20/2021  Constitutional:  Pleasant, generally well appearing female in no acute distress. Psychiatric: Normal mood and affect. Behavior is normal. EENT: Pupils normal.   Conjunctivae are normal. No scleral icterus. Neck supple.  Cardiovascular: Normal rate, regular rhythm.  Pulmonary/chest: Effort normal and breath sounds normal. No wheezing, rales or rhonchi. Abdominal: Soft, nondistended, nontender. Bowel sounds active throughout. There are no masses palpable. No hepatomegaly. Neurological: Alert and oriented to person place and time.    Whitney Cluster, NP  11/10/2023, 8:40 AM  Cc:  Deeann Saint, MD

## 2023-12-25 ENCOUNTER — Ambulatory Visit (INDEPENDENT_AMBULATORY_CARE_PROVIDER_SITE_OTHER): Payer: Managed Care, Other (non HMO) | Admitting: Family Medicine

## 2023-12-25 ENCOUNTER — Encounter: Payer: Self-pay | Admitting: Family Medicine

## 2023-12-25 VITALS — BP 110/68 | HR 88 | Temp 97.7°F | Ht 62.6 in | Wt 145.4 lb

## 2023-12-25 DIAGNOSIS — M940 Chondrocostal junction syndrome [Tietze]: Secondary | ICD-10-CM

## 2023-12-25 DIAGNOSIS — M7989 Other specified soft tissue disorders: Secondary | ICD-10-CM

## 2023-12-25 NOTE — Progress Notes (Signed)
 Established Patient Office Visit   Subjective  Patient ID: Whitney Kent, female    DOB: [birth year]  Age: 53 y.o. MRN: [id number]  Chief Complaint  Patient presents with   Chest Pain    3 weeks ago, chest discomfort, rate of pain 2 out of 10, sitting forward, breathing-in, turning, laying on back,     Pt is a 53 yo female seen for acute concern(s).  Pt notes chest discomfort and popping of sternum x 3 wks.  Worse with laying on back, turning, leaning forward or backward, or deep breathing.  Pain is R lateral edge of sternum.  Pain is sharp and reproducible.  Tried heat.  Thought may have been caused by doing wall push ups.  Pt also mentions a soft mass on posterior RLE.  Present times years.  No change in size or shape.  Nonpainful.  Patient has noticed slight darkening of skin in area.    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  Pt had 1 seizure after this dx.  Not sure if related to dx or not. maw   History of asthma    none since age 73   History of depression    Seizures (HCC)    2004. only 1 seizure   Stroke (HCC) 2019   mild no residual deficit found on mri   Syncope    occ with migraine fatigue with aura and migraine   Vitamin D  deficiency    Past Surgical History:  Procedure  Laterality Date   COLONOSCOPY  2018   DILATATION & CURETTAGE/HYSTEROSCOPY WITH MYOSURE N/A 07/11/2019   Procedure: DILATATION & CURETTAGE/HYSTEROSCOPY WITH MYOSURE;  Surgeon: Rutherford Gain, MD;  Location: [facility];  Service: Gynecology;  Laterality: N/A;   DILATATION & CURETTAGE/HYSTEROSCOPY WITH MYOSURE N/A 04/09/2021   Procedure: DILATATION & CURETTAGE/HYSTEROSCOPY WITH MYOSURE;  Surgeon: Rutherford Gain, MD;  Location: [facility] [place];  Service: Gynecology;  Laterality: N/A;   LAPAROSCOPIC TUBAL LIGATION Bilateral 11/27/2020   Procedure: LAPAROSCOPIC TUBAL LIGATION with BIPOLAR CAUTERY;  Surgeon: Rutherford Gain, MD;  Location: [facility] [place];  Service: Gynecology;  Laterality: Bilateral;   WISDOM TOOTH EXTRACTION     2009 x4 removed   Social History   Tobacco Use   Smoking status: Never   Smokeless tobacco: Never  Vaping Use   Vaping status: Never Used  Substance Use Topics   Alcohol use: Yes    Comment: occasionally   Drug use: No   Family History  Problem Relation Age of Onset   Uterine cancer Mother    Alcohol abuse Mother    Arthritis Mother    Depression Mother    Drug abuse Mother    Heart disease  Mother    Mental illness Mother    Stroke Mother    Kidney failure Father    Alcohol abuse Father    Diabetes Father    Prostate cancer Father    Colon cancer Father 66   Kidney disease Father    Migraines Father    Depression Brother    Migraines Brother    Colon cancer Maternal Aunt    Arthritis Maternal Grandmother    COPD Maternal Grandmother    Heart attack Maternal Grandmother    Hypertension Maternal Grandmother    Hyperlipidemia Maternal Grandmother    Arthritis Maternal Grandfather    Heart attack Maternal Grandfather    Hyperlipidemia Maternal Grandfather    Diabetes Paternal Grandfather    Depression Daughter    Asthma Daughter    Depression Daughter    Autism Daughter    Esophageal cancer Neg  Hx    Pancreatic cancer Neg Hx    Rectal cancer Neg Hx    Stomach cancer Neg Hx    Thyroid  disease Neg Hx    Allergies  Allergen Reactions   Dust Mite Extract Itching   No Known Allergies Other (See Comments)   Levonorgestrel-Ethinyl Estrad Itching      ROS Negative unless stated above    Objective:     BP 110/68 (BP Location: Right Arm, Patient Position: Sitting, Cuff Size: Normal)   Pulse 88   Temp 97.7 F (36.5 C) (Oral)   Ht 5' 2.6 (1.59 m)   Wt 145 lb 6.4 oz (66 kg)   LMP 04/20/2021   SpO2 98%   BMI 26.09 kg/m  BP Readings from Last 3 Encounters:  12/25/23 110/68  11/10/23 98/66  09/13/23 98/68   Wt Readings from Last 3 Encounters:  12/25/23 145 lb 6.4 oz (66 kg)  11/10/23 141 lb 2 oz (64 kg)  09/13/23 142 lb 3.2 oz (64.5 kg)      Physical Exam Constitutional:      Appearance: Normal appearance.  HENT:     Head: Normocephalic and atraumatic.     Nose: Nose normal.     Mouth/Throat:     Mouth: Mucous membranes are moist.  Eyes:     Extraocular Movements: Extraocular movements intact.     Conjunctiva/sclera: Conjunctivae normal.  Cardiovascular:     Rate and Rhythm: Normal rate.  Pulmonary:     Effort: Pulmonary effort is normal.  Chest:     Chest wall: Tenderness present.    Musculoskeletal:     Comments: A subtle soft mass~2cm x 1.5 cm palpated in posterior right lower leg inferior to calf.  Skin:    General: Skin is warm and dry.  Neurological:     Mental Status: She is alert and oriented to person, place, and time. Mental status is at baseline.      No results found for any visits on 12/25/23.    Assessment & Plan:  Costochondritis -Sharp chest discomfort with change in position.  Reproducible on exam. -Discussed supportive care including heat, NSAIDs, topical analgesics, etc. -Given handout  Soft tissue mass -In posterior right lower leg -Likely a lipoma.  Also consider a cyst. -Consider ultrasound -Discussed removal if  desired.  Patient wishes to wait at this time.  Return if symptoms worsen or fail to improve.   Clotilda JONELLE Single, MD

## 2023-12-30 ENCOUNTER — Encounter: Payer: Self-pay | Admitting: Family Medicine

## 2024-01-11 ENCOUNTER — Other Ambulatory Visit: Payer: Self-pay

## 2024-01-11 MED ORDER — OMEPRAZOLE 20 MG PO CPDR: 20.0000 mg | DELAYED_RELEASE_CAPSULE | Freq: Every day | ORAL | 0 refills | Status: AC

## 2024-03-27 ENCOUNTER — Telehealth: Payer: Self-pay | Admitting: Gastroenterology

## 2024-03-27 NOTE — Telephone Encounter (Signed)
 Received MyChart message from patient stating she is still having pain and bloating that is sometimes relieved with bowel movement or fasting from food.  Still concerned bowel not passing although she has a movement daily.  She wanted to know if the lower back pain is still connected to constipation and wanted to know if she could resume her prescription or be tested for parasites or a blockage.  She is on a plant-based diet and says her bowel movements are consistently dark and smelly.  Please call patient and advise.  Thank you.

## 2024-03-27 NOTE — Telephone Encounter (Signed)
 Left message for patient to call back

## 2024-03-28 NOTE — Telephone Encounter (Signed)
 Left message for patient to call back

## 2024-04-01 NOTE — Telephone Encounter (Signed)
 Following 2 phone call attempts, no return call from patient. Will await further correspondence from patient before any additional attempts to reach out.

## 2024-04-22 ENCOUNTER — Encounter: Payer: Self-pay | Admitting: Family Medicine

## 2024-04-22 ENCOUNTER — Ambulatory Visit (INDEPENDENT_AMBULATORY_CARE_PROVIDER_SITE_OTHER): Admitting: Family Medicine

## 2024-04-22 VITALS — BP 96/66 | HR 69 | Temp 98.0°F | Ht 62.6 in | Wt 148.0 lb

## 2024-04-22 DIAGNOSIS — R7303 Prediabetes: Secondary | ICD-10-CM | POA: Diagnosis not present

## 2024-04-22 DIAGNOSIS — E559 Vitamin D deficiency, unspecified: Secondary | ICD-10-CM | POA: Diagnosis not present

## 2024-04-22 DIAGNOSIS — Z0000 Encounter for general adult medical examination without abnormal findings: Secondary | ICD-10-CM

## 2024-04-22 DIAGNOSIS — F411 Generalized anxiety disorder: Secondary | ICD-10-CM

## 2024-04-22 DIAGNOSIS — R8290 Unspecified abnormal findings in urine: Secondary | ICD-10-CM

## 2024-04-22 DIAGNOSIS — R0789 Other chest pain: Secondary | ICD-10-CM

## 2024-04-22 LAB — CBC WITH DIFFERENTIAL/PLATELET
Basophils Absolute: 0 10*3/uL (ref 0.0–0.1)
Basophils Relative: 0.1 % (ref 0.0–3.0)
Eosinophils Absolute: 0.2 10*3/uL (ref 0.0–0.7)
Eosinophils Relative: 4.3 % (ref 0.0–5.0)
HCT: 39.5 % (ref 36.0–46.0)
Hemoglobin: 13 g/dL (ref 12.0–15.0)
Lymphocytes Relative: 25.6 % (ref 12.0–46.0)
Lymphs Abs: 1.5 10*3/uL (ref 0.7–4.0)
MCHC: 32.9 g/dL (ref 30.0–36.0)
MCV: 80.3 fl (ref 78.0–100.0)
Monocytes Absolute: 0.3 10*3/uL (ref 0.1–1.0)
Monocytes Relative: 5.8 % (ref 3.0–12.0)
Neutro Abs: 3.7 10*3/uL (ref 1.4–7.7)
Neutrophils Relative %: 64.2 % (ref 43.0–77.0)
Platelets: 259 10*3/uL (ref 150.0–400.0)
RBC: 4.92 Mil/uL (ref 3.87–5.11)
RDW: 13.7 % (ref 11.5–15.5)
WBC: 5.8 10*3/uL (ref 4.0–10.5)

## 2024-04-22 LAB — COMPREHENSIVE METABOLIC PANEL WITH GFR
ALT: 26 U/L (ref 0–35)
AST: 23 U/L (ref 0–37)
Albumin: 4.3 g/dL (ref 3.5–5.2)
Alkaline Phosphatase: 61 U/L (ref 39–117)
BUN: 9 mg/dL (ref 6–23)
CO2: 28 meq/L (ref 19–32)
Calcium: 9.3 mg/dL (ref 8.4–10.5)
Chloride: 102 meq/L (ref 96–112)
Creatinine, Ser: 0.73 mg/dL (ref 0.40–1.20)
GFR: 95.41 mL/min (ref >60.00)
Glucose, Bld: 80 mg/dL (ref 70–99)
Potassium: 3.5 meq/L (ref 3.5–5.1)
Sodium: 139 meq/L (ref 135–145)
Total Bilirubin: 0.6 mg/dL (ref 0.2–1.2)
Total Protein: 6.9 g/dL (ref 6.0–8.3)

## 2024-04-22 LAB — POC URINALSYSI DIPSTICK (AUTOMATED)
Bilirubin, UA: NEGATIVE
Blood, UA: NEGATIVE
Glucose, UA: NEGATIVE
Ketones, UA: NEGATIVE
Leukocytes, UA: NEGATIVE
Nitrite, UA: NEGATIVE
Protein, UA: NEGATIVE
Spec Grav, UA: <=1.005 — AB (ref 1.010–1.025)
Urobilinogen, UA: 0.2 U/dL
pH, UA: 7 (ref 5.0–8.0)

## 2024-04-22 LAB — LIPID PANEL
Cholesterol: 187 mg/dL (ref 0–200)
HDL: 75.9 mg/dL (ref >39.00)
LDL Cholesterol: 97 mg/dL (ref 0–99)
NonHDL: 111.37
Total CHOL/HDL Ratio: 2
Triglycerides: 70 mg/dL (ref 0.0–149.0)
VLDL: 14 mg/dL (ref 0.0–40.0)

## 2024-04-22 LAB — TSH: TSH: 0.55 u[IU]/mL (ref 0.35–5.50)

## 2024-04-22 LAB — VITAMIN D 25 HYDROXY (VIT D DEFICIENCY, FRACTURES): VITD: 33.02 ng/mL (ref 30.00–100.00)

## 2024-04-22 LAB — T4, FREE: Free T4: 0.88 ng/dL (ref 0.60–1.60)

## 2024-04-22 NOTE — Progress Notes (Signed)
 Established Patient Office Visit   Subjective  Patient ID: Whitney Kent, female    DOB: [birth year]  Age: 53 y.o. MRN: [id number]  Chief Complaint  Patient presents with   Annual Exam   Urinary Tract Infection    Patient is a 53 year old female seen for CPE.  Patient endorses concerns for UTI.  States was treated for UTI several weeks ago but wanted to make sure symptoms have resolved as she occasionally notes malodorous urine and frequency.  Drinking more water.  Denies dysuria, back pain, nausea, vomiting, fever, changes in diet.  Patient also notes continued chest pain with certain movements.  Tries to avoid those movements.  Symptoms thought 2/2 costochondritis but have been ongoing since December.    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  Pt had 1 seizure after this dx.  Not sure if related to dx or not. maw   History of asthma    none since age 3   History of depression    Seizures (HCC)    2004. only 1 seizure   Stroke (HCC) 2019   mild no residual deficit found on mri   Syncope    occ with migraine fatigue with aura and migraine   Vitamin D  deficiency    Past Surgical History:  Procedure Laterality Date   COLONOSCOPY  2018   DILATATION & CURETTAGE/HYSTEROSCOPY WITH MYOSURE  N/A 07/11/2019   Procedure: DILATATION & CURETTAGE/HYSTEROSCOPY WITH MYOSURE;  Surgeon: Ivery Marking, MD;  Location:  [place];  Service: Gynecology;  Laterality: N/A;   DILATATION & CURETTAGE/HYSTEROSCOPY WITH MYOSURE N/A 04/09/2021   Procedure: DILATATION & CURETTAGE/HYSTEROSCOPY WITH MYOSURE;  Surgeon: Ivery Marking, MD;  Location: [facility];  Service: Gynecology;  Laterality: N/A;   LAPAROSCOPIC TUBAL LIGATION Bilateral 11/27/2020   Procedure: LAPAROSCOPIC TUBAL LIGATION with BIPOLAR CAUTERY;  Surgeon: Ivery Marking, MD;  Location: [facility] ;  Service: Gynecology;  Laterality: Bilateral;   WISDOM TOOTH EXTRACTION     2009 x4 removed   Social History   Tobacco Use   Smoking status: Never   Smokeless tobacco: Never  Vaping Use   Vaping status: Never Used  Substance Use Topics   Alcohol use: Yes    Comment: occasionally   Drug use: No   Family History  Problem Relation Age of Onset   Uterine cancer Mother    Alcohol abuse Mother    Arthritis Mother    Depression Mother    Drug abuse Mother    Heart disease Mother    Mental illness Mother    Stroke Mother    Kidney failure Father    Alcohol abuse Father    Diabetes Father    Prostate cancer  Father    Colon cancer Father 17   Kidney disease Father    Migraines Father    Depression Brother    Migraines Brother    Colon cancer Maternal Aunt    Arthritis Maternal Grandmother    COPD Maternal Grandmother    Heart attack Maternal Grandmother    Hypertension Maternal Grandmother    Hyperlipidemia Maternal Grandmother    Arthritis Maternal Grandfather    Heart attack Maternal Grandfather    Hyperlipidemia Maternal Grandfather    Diabetes Paternal Grandfather    Depression Daughter    Asthma Daughter    Depression Daughter    Autism Daughter    Esophageal cancer Neg Hx    Pancreatic cancer Neg Hx    Rectal cancer Neg Hx    Stomach cancer Neg Hx     Thyroid  disease Neg Hx    Allergies  Allergen Reactions   Dust Mite Extract Itching   No Known Allergies Other (See Comments)   Levonorgestrel-Ethinyl Estrad Itching      ROS Negative unless stated above    Objective:     BP 96/66 (BP Location: Left Arm, Patient Position: Sitting, Cuff Size: Normal)   Pulse 69   Temp 98 F (36.7 C) (Oral)   Ht 5' 2.6" (1.59 m)   Wt 148 lb (67.1 kg)   LMP 04/20/2021   SpO2 99%   BMI 26.55 kg/m  BP Readings from Last 3 Encounters:  04/22/24 96/66  12/25/23 110/68  11/10/23 98/66   Wt Readings from Last 3 Encounters:  04/22/24 148 lb (67.1 kg)  12/25/23 145 lb 6.4 oz (66 kg)  11/10/23 141 lb 2 oz (64 kg)      Physical Exam Constitutional:      Appearance: Normal appearance.  HENT:     Head: Normocephalic and atraumatic.     Right Ear: Tympanic membrane, ear canal and external ear normal.     Left Ear: Tympanic membrane, ear canal and external ear normal.     Nose: Nose normal.     Mouth/Throat:     Mouth: Mucous membranes are moist.     Pharynx: No oropharyngeal exudate or posterior oropharyngeal erythema.  Eyes:     General: No scleral icterus.    Extraocular Movements: Extraocular movements intact.     Conjunctiva/sclera: Conjunctivae normal.     Pupils: Pupils are equal, round, and reactive to light.  Neck:     Thyroid : No thyromegaly.     Comments: Goiter Cardiovascular:     Rate and Rhythm: Normal rate and regular rhythm.     Pulses: Normal pulses.     Heart sounds: Normal heart sounds. No murmur heard.    No friction rub.  Pulmonary:     Effort: Pulmonary effort is normal.     Breath sounds: Normal breath sounds. No wheezing, rhonchi or rales.  Abdominal:     General: Bowel sounds are normal.     Palpations: Abdomen is soft.     Tenderness: There is no abdominal tenderness.  Musculoskeletal:        General: No deformity. Normal range of motion.  Lymphadenopathy:     Cervical: No cervical adenopathy.   Skin:    General: Skin is warm and dry.     Findings: No lesion.  Neurological:     General: No focal deficit present.     Mental Status: She is alert and oriented to person, place, and time.  Psychiatric:  Mood and Affect: Mood normal.        Thought Content: Thought content normal.        04/22/2024    1:56 PM 12/25/2023    3:44 PM 09/13/2023    1:38 PM  Depression screen PHQ 2/9  Decreased Interest 0 0 0  Down, Depressed, Hopeless 0 0 1  PHQ - 2 Score 0 0 1  Altered sleeping 2 3 1   Tired, decreased energy 0 2 0  Change in appetite 0 1 1  Feeling bad or failure about yourself  0 0 0  Trouble concentrating 1 2 1   Moving slowly or fidgety/restless 0 1 0  Suicidal thoughts 0 0 0  PHQ-9 Score 3 9 4   Difficult doing work/chores Not difficult at all Somewhat difficult Somewhat difficult      04/22/2024    1:57 PM 12/25/2023    3:45 PM 09/13/2023    1:38 PM 06/23/2023    2:59 PM  GAD 7 : Generalized Anxiety Score  Nervous, Anxious, on Edge 1 1 1 1   Control/stop worrying 0 0 0 1  Worry too much - different things  1 1 1   Trouble relaxing 1 3 0 2  Restless 0 1 0 1  Easily annoyed or irritable 1 0 1 2  Afraid - awful might happen 0 0 0 1  Total GAD 7 Score  6 3 9   Anxiety Difficulty Not difficult at all Somewhat difficult Somewhat difficult Somewhat difficult     Results for orders placed or performed in visit on 04/22/24  POCT Urinalysis Dipstick (Automated)  Result Value Ref Range   Color, UA yellow    Clarity, UA clear    Glucose, UA Negative Negative   Bilirubin, UA neg    Ketones, UA neg    Spec Grav, UA <=1.005 (A) 1.010 - 1.025   Blood, UA neg    pH, UA 7.0 5.0 - 8.0   Protein, UA Negative Negative   Urobilinogen, UA 0.2 0.2 or 1.0 E.U./dL   Nitrite, UA neg    Leukocytes, UA Negative Negative      Assessment & Plan:  Well adult exam -     CBC with Differential/Platelet; Future -     Comprehensive metabolic panel with GFR; Future -     Hemoglobin A1c;  Future -     Lipid panel; Future -     T4, free; Future -     TSH; Future  Prediabetes -     Hemoglobin A1c; Future  Vitamin D  deficiency -     VITAMIN D  25 Hydroxy (Vit-D Deficiency, Fractures); Future  GAD (generalized anxiety disorder) -     T4, free; Future -     TSH; Future  Malodorous urine -     POCT Urinalysis Dipstick (Automated)  Other chest pain  Age-appropriate health screenings discussed.  Obtain labs.  Immunizations reviewed.  Consider shingles vaccine and Tdap.  Pt to schedule mammogram, last one done 04/21/22.  Colonoscopy done 11/20/2017 repeat due in 10 years, 2028.  Hemoglobin A1c 5.9% on 04/20/2023.  Lifestyle modifications encouraged.  PHQ-9 and GAD-7 given this visit.  Continue self-care.  POC UA without signs of infection.  Continue supportive care including stretching for costochondritis versus musculoskeletal chest pain  Return if symptoms worsen or fail to improve.   Viola Greulich, MD

## 2024-04-22 NOTE — Telephone Encounter (Signed)
 Form was given back during appt

## 2024-04-23 LAB — HEMOGLOBIN A1C: Hgb A1c MFr Bld: 5.8 % (ref 4.6–6.5)

## 2024-04-24 ENCOUNTER — Encounter: Payer: Self-pay | Admitting: Family Medicine

## 2024-06-28 ENCOUNTER — Encounter: Payer: Self-pay | Admitting: Family Medicine

## 2024-08-12 DIAGNOSIS — U071 COVID-19: Secondary | ICD-10-CM | POA: Diagnosis not present

## 2024-09-05 DIAGNOSIS — R053 Chronic cough: Secondary | ICD-10-CM | POA: Diagnosis not present

## 2024-09-17 DIAGNOSIS — H903 Sensorineural hearing loss, bilateral: Secondary | ICD-10-CM | POA: Diagnosis not present

## 2024-09-17 DIAGNOSIS — H9313 Tinnitus, bilateral: Secondary | ICD-10-CM | POA: Diagnosis not present

## 2024-10-16 ENCOUNTER — Other Ambulatory Visit

## 2024-10-16 ENCOUNTER — Encounter: Payer: Self-pay | Admitting: Endocrinology

## 2024-10-16 ENCOUNTER — Ambulatory Visit: Admitting: Endocrinology

## 2024-10-16 VITALS — BP 98/68 | HR 87 | Resp 16 | Ht 62.5 in | Wt 153.8 lb

## 2024-10-16 DIAGNOSIS — E041 Nontoxic single thyroid nodule: Secondary | ICD-10-CM

## 2024-10-16 NOTE — Progress Notes (Signed)
 Outpatient Endocrinology Note [name], MD   Patient's Name: Whitney Kent    DOB: 06/24/1971    MRN: [id number]  REASON OF VISIT: Follow-up for thyroid  nodule /thyromegaly  PCP: Mercer Clotilda SAUNDERS, MD  HISTORY OF PRESENT ILLNESS:   Whitney Kent is a 53 y.o. old female with past medical history as listed below is presented for a follow up for thyroid  nodule /thyromegaly.  Pertinent Thyroid  History:  Patient was previously and last time seen by Dr. Von in April 2023.  She was being following for enlargement of thyroid  and right thyroid  nodule.  Recently patient had COVID-19 infection in September 2025, had throat and neck discomfort, and presented today for the follow-up of thyroid  disorder.  Patient is known to have enlarged thyroid  from early 2000.  Patient has remained euthyroid, never required thyroid  medication.  She did not require and did not have needle biopsy of thyroid  nodule in the past.  After COVID-19 infection in September patient has felt somewhat enlargement of the right thyroid  and throat and neck discomfort.  No dysphagia or choking.  Overall feeling normal energy, no cold or heat intolerance.  No change in bowel habit.  She had normal thyroid  function test in May 2025.  No family history of thyroid  disorder or thyroid  cancer.  No radiation exposure to head and neck.  Most recent ultrasound thyroid  was completed in May 2019, reviewed images mildly heterogenous thyroid  parenchyma spongiform nodule measuring 0.6 cm on right superior region stable compared to ultrasound 5 years prior.  No worrisome features.   Latest Reference Range & Units 04/22/24 13:58  TSH 0.35 - 5.50 uIU/mL 0.55  T4,Free(Direct) 0.60 - 1.60 ng/dL 9.11    Interval history  Patient was previously seen in this clinic in April 2023.  She presents for follow-up of thyroid  enlargement and right thyroid  nodule.  She has some neck discomfort otherwise no significant neck  compressive symptoms.  No hypo and hyperthyroid symptoms.  She has not been on thyroid  medication, has remained euthyroid in the past.   REVIEW OF SYSTEMS:  As per history of present illness.   PAST MEDICAL HISTORY: Past Medical History:  Diagnosis Date   ADHD dx feb 2022   no meds for    Allergy    Anemia    Anxiety    Family history of adverse reaction to anesthesia    daughter gets hiccups after anesthesia   GERD (gastroesophageal reflux disease)    Goiter right neck   dr von follows lov 01-28-2021   last us  04-03-2018   Hemiplegic migraine    pt dx 2004.  Pt had 1 seizure after this dx.  Not sure if related to dx or not. maw   History of asthma    none since age 20   History of depression    Seizures (HCC)    2004. only 1 seizure   Stroke (HCC) 2019   mild no residual deficit found on mri   Syncope    occ with migraine fatigue with aura and migraine   Vitamin D  deficiency     PAST SURGICAL HISTORY: Past Surgical History:  Procedure Laterality Date   COLONOSCOPY  2018   DILATATION & CURETTAGE/HYSTEROSCOPY WITH MYOSURE N/A 07/11/2019   Procedure: DILATATION & CURETTAGE/HYSTEROSCOPY WITH MYOSURE;  Surgeon: Rutherford Gain, MD;  Location: [facility];  Service: Gynecology;  Laterality: N/A;   DILATATION & CURETTAGE/HYSTEROSCOPY WITH MYOSURE N/A 04/09/2021   Procedure: DILATATION & CURETTAGE/HYSTEROSCOPY WITH MYOSURE;  Surgeon: Rutherford Gain, MD;  Location: [facility];  Service: Gynecology;  Laterality: N/A;   LAPAROSCOPIC TUBAL LIGATION Bilateral 11/27/2020   Procedure: LAPAROSCOPIC TUBAL LIGATION with BIPOLAR CAUTERY;  Surgeon: Rutherford Gain, MD;  Location: [facility];  Service: Gynecology;  Laterality: Bilateral;   WISDOM TOOTH EXTRACTION     2009 x4 removed    ALLERGIES: Allergies  Allergen Reactions   Dust Mite Extract Itching   No Known Allergies Other (See Comments)   Levonorgestrel-Ethinyl Estrad  Itching    FAMILY HISTORY:  Family History  Problem Relation Age of Onset   Uterine cancer Mother    Alcohol abuse Mother    Arthritis Mother    Depression Mother    Drug abuse Mother    Heart disease Mother    Mental illness Mother    Stroke Mother    Kidney failure Father    Alcohol abuse Father    Diabetes Father    Prostate cancer Father    Colon cancer Father 61   Kidney disease Father    Migraines Father    Depression Brother    Migraines Brother    Colon cancer Maternal Aunt    Arthritis Maternal Grandmother    COPD Maternal Grandmother    Heart attack Maternal Grandmother    Hypertension Maternal Grandmother    Hyperlipidemia Maternal Grandmother    Arthritis Maternal Grandfather    Heart attack Maternal Grandfather    Hyperlipidemia Maternal Grandfather    Diabetes Paternal Grandfather    Depression Daughter    Asthma Daughter    Depression Daughter    Autism Daughter    Esophageal cancer Neg Hx    Pancreatic cancer Neg Hx    Rectal cancer Neg Hx    Stomach cancer Neg Hx    Thyroid  disease Neg Hx     SOCIAL HISTORY: Social History   Socioeconomic History   Marital status: Divorced    Spouse name: Not on file   Number of children: 2   Years of education: 18   Highest education level: Some college, no degree  Occupational History   Not on file  Tobacco Use   Smoking status: Never   Smokeless tobacco: Never  Vaping Use   Vaping status: Never Used  Substance and Sexual Activity   Alcohol use: Yes    Comment: occasionally   Drug use: No   Sexual activity: Yes    Partners: Male    Birth control/protection: Condom  Other Topics Concern   Not on file  Social History Narrative   Lives at home with her daughter   Right handed   Drinks occasional caffeine    Social Drivers of Health   Financial Resource Strain: High Risk (06/23/2023)   Overall Financial Resource Strain (CARDIA)    Difficulty of Paying Living Expenses: Hard  Food Insecurity:  Low Risk  (09/05/2024)   Received from [facility]   Hunger Vital Sign    Within the past 12 months, you worried that your food would run out before you got money to buy more: Never true    Within the past 12 months, the food you bought just didn't last and you didn't have money to get more. : Never true  Transportation Needs: No Transportation Needs (09/05/2024)   Received from [facility]    In the past 12 months, has lack of reliable transportation kept you from medical appointments, meetings, work or from getting things needed for daily  living? : No  Physical Activity: Insufficiently Active (06/23/2023)   Exercise Vital Sign    Days of Exercise per Week: 2 days    Minutes of Exercise per Session: 60 min  Stress: Stress Concern Present (06/23/2023)   [employer] of Occupational Health - Occupational Stress Questionnaire    Feeling of Stress : To some extent  Social Connections: Moderately Integrated (06/23/2023)   Social Connection and Isolation Panel    Frequency of Communication with Friends and Family: More than three times a week    Frequency of Social Gatherings with Friends and Family: Once a week    Attends Religious Services: More than 4 times per year    Active Member of [employer] or Organizations: Yes    Attends Engineer, Structural: More than 4 times per year    Marital Status: Divorced    MEDICATIONS:  Current Outpatient Medications  Medication Sig Dispense Refill   bisacodyl (DULCOLAX) 5 MG EC tablet Take 10 mg by mouth as needed for moderate constipation.     cetirizine (ZYRTEC) 10 MG tablet Take 10 mg by mouth as needed. OTC     Cholecalciferol (VITAMIN D3) 3000 units TABS Take by mouth daily.     ibuprofen  (ADVIL ) 800 MG tablet Take 1 tablet (800 mg total) by mouth every 8 (eight) hours as needed. 30 tablet 5   Magnesium 100 MG CAPS magnesium     Magnesium Gluconate (MAGNESIUM 27 PO)      omeprazole  (PRILOSEC) 20 MG capsule Take 1 capsule  (20 mg total) by mouth daily. Take 30 minutes before breakfast. 30 capsule 0   Turmeric (QC TUMERIC COMPLEX PO) Take by mouth.     No current facility-administered medications for this visit.    PHYSICAL EXAM: Vitals:   10/16/24 1434  BP: 98/68  Pulse: 87  Resp: 16  SpO2: 99%  Weight: 153 lb 12.8 oz (69.8 kg)  Height: 5' 2.5 (1.588 m)   Body mass index is 27.68 kg/m.  Wt Readings from Last 3 Encounters:  10/16/24 153 lb 12.8 oz (69.8 kg)  04/22/24 148 lb (67.1 kg)  12/25/23 145 lb 6.4 oz (66 kg)     General: Well developed, well nourished female in no apparent distress.  HEENT: AT/[place], no external lesions. Hearing intact to the spoken word Eyes: No stare, proptosis. Conjunctiva clear and no icterus. Neck: Trachea midline, neck supple with appreciable thyromegaly on right side or no lymphadenopathy and no palpable thyroid  nodules Lungs: Clear to auscultation, no wheeze. Respirations not labored Heart: S1S2, Regular in rate and rhythm.  Abdomen: Soft, non tender, non distended Neurologic: Alert, oriented, normal speech, deep tendon biceps reflexes normal,  no gross focal neurological deficit Extremities: No pedal pitting edema, no tremors of outstretched hands Skin: Warm, color good.  Psychiatric: Does not appear depressed or anxious  PERTINENT HISTORIC LABORATORY AND IMAGING STUDIES:  All pertinent laboratory results were reviewed. Please see HPI also for further details.   TSH  Date Value Ref Range Status  04/22/2024 0.55 0.35 - 5.50 uIU/mL Final  04/20/2023 0.47 0.35 - 5.50 uIU/mL Final  04/18/2022 0.76 0.35 - 5.50 uIU/mL Final     ASSESSMENT / PLAN  1. Right thyroid  nodule    Patient is known to have thyroid  enlargement since early 2000.  She has a spongiform subcentimeter thyroid  nodule on the right side measuring 0.6 cm in 2019, it was being monitored with serial ultrasound in the past.  Recently she has felt some enlargement  of right thyroid  with neck  discomfort.  She has noticed the symptoms after she had COVID-19 infection in September of this year.  No worrisome features on the ultrasound thyroid  done in 2019 reviewed images.  - No other compressive symptoms, euthyroid, no family h/o thyroid  cancer, no h/o radiation exposure.  Plan: - Check ultrasound thyroid  to monitor right thyroid  nodule. - Check thyroid  function test today.   Diagnoses and all orders for this visit:  Right thyroid  nodule -     T4, free -     TSH -     US  THYROID ; Future    DISPOSITION Follow up in clinic in 12 months suggested.  All questions answered and patient verbalized understanding of the plan.  Demiyah Fischbach, MD [facility] Endocrinology [facility] [address], [address] [place], [place] [address] Phone # [phone]  At least part of this note was generated using voice recognition software. Inadvertent word errors may have occurred, which were not recognized during the proofreading process.

## 2024-10-17 ENCOUNTER — Ambulatory Visit: Payer: Self-pay | Admitting: Endocrinology

## 2024-10-17 LAB — TSH: TSH: 0.6 m[IU]/L

## 2024-10-17 LAB — T4, FREE: Free T4: 1.3 ng/dL (ref 0.8–1.8)

## 2024-10-30 ENCOUNTER — Other Ambulatory Visit

## 2024-11-06 ENCOUNTER — Ambulatory Visit
Admission: RE | Admit: 2024-11-06 | Discharge: 2024-11-06 | Disposition: A | Discharge status: Home / Self Care | Source: Ambulatory Visit | Attending: Endocrinology | Admitting: Endocrinology

## 2024-11-06 DIAGNOSIS — E041 Nontoxic single thyroid nodule: Secondary | ICD-10-CM

## 2024-11-06 DIAGNOSIS — E049 Nontoxic goiter, unspecified: Secondary | ICD-10-CM | POA: Diagnosis not present

## 2025-10-16 ENCOUNTER — Ambulatory Visit: Admitting: Endocrinology
